# Patient Record
Sex: Female | Born: 1945 | Race: White | Hispanic: No | Marital: Married | Smoking: Current some day smoker
Health system: Southern US, Community
[De-identification: ages and names within clinical notes are randomized; demographics above are authoritative.]

## PROBLEM LIST (undated history)

## (undated) DIAGNOSIS — J189 Pneumonia, unspecified organism: Secondary | ICD-10-CM

## (undated) DIAGNOSIS — I499 Cardiac arrhythmia, unspecified: Secondary | ICD-10-CM

## (undated) DIAGNOSIS — I739 Peripheral vascular disease, unspecified: Secondary | ICD-10-CM

## (undated) DIAGNOSIS — I219 Acute myocardial infarction, unspecified: Secondary | ICD-10-CM

## (undated) DIAGNOSIS — Z720 Tobacco use: Secondary | ICD-10-CM

## (undated) DIAGNOSIS — M1990 Unspecified osteoarthritis, unspecified site: Secondary | ICD-10-CM

## (undated) DIAGNOSIS — E785 Hyperlipidemia, unspecified: Secondary | ICD-10-CM

## (undated) DIAGNOSIS — C801 Malignant (primary) neoplasm, unspecified: Secondary | ICD-10-CM

## (undated) HISTORY — PX: OTHER SURGICAL HISTORY: SHX169

---

## 2007-05-23 ENCOUNTER — Emergency Department (HOSPITAL_COMMUNITY): Admission: EM | Admit: 2007-05-23 | Discharge: 2007-05-23 | Payer: Self-pay | Admitting: Emergency Medicine

## 2007-05-29 ENCOUNTER — Encounter: Admission: RE | Admit: 2007-05-29 | Discharge: 2007-05-29 | Payer: Self-pay | Admitting: Family Medicine

## 2007-06-12 ENCOUNTER — Encounter: Admission: RE | Admit: 2007-06-12 | Discharge: 2007-06-12 | Payer: Self-pay | Admitting: Family Medicine

## 2007-06-20 ENCOUNTER — Encounter (INDEPENDENT_AMBULATORY_CARE_PROVIDER_SITE_OTHER): Payer: Self-pay | Admitting: Family Medicine

## 2007-06-20 ENCOUNTER — Ambulatory Visit (HOSPITAL_COMMUNITY): Admission: RE | Admit: 2007-06-20 | Discharge: 2007-06-20 | Payer: Self-pay | Admitting: Family Medicine

## 2007-06-22 ENCOUNTER — Ambulatory Visit: Payer: Self-pay | Admitting: Vascular Surgery

## 2007-06-28 ENCOUNTER — Ambulatory Visit: Payer: Self-pay | Admitting: Surgery

## 2007-06-28 ENCOUNTER — Ambulatory Visit (HOSPITAL_COMMUNITY): Admission: RE | Admit: 2007-06-28 | Discharge: 2007-06-28 | Payer: Self-pay | Admitting: Surgery

## 2007-07-13 ENCOUNTER — Ambulatory Visit: Payer: Self-pay | Admitting: Vascular Surgery

## 2007-07-23 ENCOUNTER — Observation Stay (HOSPITAL_COMMUNITY): Admission: RE | Admit: 2007-07-23 | Discharge: 2007-07-24 | Payer: Self-pay | Admitting: Vascular Surgery

## 2007-07-24 ENCOUNTER — Encounter: Payer: Self-pay | Admitting: Vascular Surgery

## 2007-07-24 ENCOUNTER — Ambulatory Visit: Payer: Self-pay | Admitting: Surgery

## 2007-08-17 ENCOUNTER — Ambulatory Visit: Payer: Self-pay | Admitting: Vascular Surgery

## 2007-10-19 ENCOUNTER — Ambulatory Visit: Payer: Self-pay | Admitting: Vascular Surgery

## 2008-01-08 ENCOUNTER — Ambulatory Visit: Payer: Self-pay | Admitting: Cardiology

## 2008-01-21 ENCOUNTER — Ambulatory Visit: Payer: Self-pay | Admitting: Cardiology

## 2008-02-06 ENCOUNTER — Ambulatory Visit: Payer: Self-pay | Admitting: Cardiology

## 2008-02-26 ENCOUNTER — Ambulatory Visit: Payer: Self-pay | Admitting: Vascular Surgery

## 2008-06-10 ENCOUNTER — Ambulatory Visit: Payer: Self-pay | Admitting: Vascular Surgery

## 2008-06-11 ENCOUNTER — Ambulatory Visit: Payer: Self-pay | Admitting: Vascular Surgery

## 2008-06-18 ENCOUNTER — Encounter: Admission: RE | Admit: 2008-06-18 | Discharge: 2008-06-18 | Payer: Self-pay | Admitting: Vascular Surgery

## 2008-06-20 ENCOUNTER — Encounter: Payer: Self-pay | Admitting: Cardiology

## 2008-06-20 ENCOUNTER — Ambulatory Visit: Payer: Self-pay

## 2008-06-24 ENCOUNTER — Telehealth: Payer: Self-pay | Admitting: Cardiology

## 2008-07-04 ENCOUNTER — Ambulatory Visit: Payer: Self-pay | Admitting: Vascular Surgery

## 2008-07-29 ENCOUNTER — Ambulatory Visit: Payer: Self-pay | Admitting: Vascular Surgery

## 2008-07-29 ENCOUNTER — Inpatient Hospital Stay (HOSPITAL_COMMUNITY): Admission: RE | Admit: 2008-07-29 | Discharge: 2008-08-02 | Payer: Self-pay | Admitting: Vascular Surgery

## 2008-07-29 ENCOUNTER — Encounter: Payer: Self-pay | Admitting: Vascular Surgery

## 2008-08-22 ENCOUNTER — Ambulatory Visit: Payer: Self-pay | Admitting: Vascular Surgery

## 2008-09-03 ENCOUNTER — Ambulatory Visit: Payer: Self-pay | Admitting: Vascular Surgery

## 2008-09-19 ENCOUNTER — Ambulatory Visit: Payer: Self-pay | Admitting: Vascular Surgery

## 2008-12-19 ENCOUNTER — Ambulatory Visit: Payer: Self-pay | Admitting: Vascular Surgery

## 2008-12-19 ENCOUNTER — Encounter: Payer: Self-pay | Admitting: Cardiology

## 2010-05-17 ENCOUNTER — Other Ambulatory Visit: Payer: Self-pay | Admitting: Family Medicine

## 2010-05-17 DIAGNOSIS — Z1231 Encounter for screening mammogram for malignant neoplasm of breast: Secondary | ICD-10-CM

## 2010-05-21 ENCOUNTER — Ambulatory Visit
Admission: RE | Admit: 2010-05-21 | Discharge: 2010-05-21 | Disposition: A | Discharge status: Home / Self Care | Payer: 59 | Source: Ambulatory Visit | Attending: Family Medicine | Admitting: Family Medicine

## 2010-05-21 DIAGNOSIS — Z1231 Encounter for screening mammogram for malignant neoplasm of breast: Secondary | ICD-10-CM

## 2010-05-24 ENCOUNTER — Other Ambulatory Visit (HOSPITAL_COMMUNITY)
Admission: RE | Admit: 2010-05-24 | Discharge: 2010-05-24 | Disposition: A | Discharge status: Home / Self Care | Payer: 59 | Source: Ambulatory Visit | Attending: Family Medicine | Admitting: Family Medicine

## 2010-05-24 ENCOUNTER — Other Ambulatory Visit: Payer: Self-pay | Admitting: Family Medicine

## 2010-05-24 DIAGNOSIS — R928 Other abnormal and inconclusive findings on diagnostic imaging of breast: Secondary | ICD-10-CM

## 2010-05-24 DIAGNOSIS — Z124 Encounter for screening for malignant neoplasm of cervix: Secondary | ICD-10-CM | POA: Insufficient documentation

## 2010-06-02 ENCOUNTER — Other Ambulatory Visit: Payer: 59

## 2010-06-15 LAB — URINALYSIS, ROUTINE W REFLEX MICROSCOPIC
Bilirubin Urine: NEGATIVE
Glucose, UA: NEGATIVE mg/dL
Hgb urine dipstick: NEGATIVE
Protein, ur: NEGATIVE mg/dL
Urobilinogen, UA: 0.2 mg/dL (ref 0.0–1.0)

## 2010-06-15 LAB — BLOOD GAS, ARTERIAL
Bicarbonate: 23 mEq/L (ref 20.0–24.0)
O2 Content: 8 L/min
Patient temperature: 98.6
Patient temperature: 98.6
TCO2: 24.1 mmol/L (ref 0–100)
TCO2: 24.6 mmol/L (ref 0–100)
pCO2 arterial: 35.4 mmHg (ref 35.0–45.0)
pCO2 arterial: 52.4 mmHg — ABNORMAL HIGH (ref 35.0–45.0)
pH, Arterial: 7.264 — ABNORMAL LOW (ref 7.350–7.400)
pH, Arterial: 7.429 — ABNORMAL HIGH (ref 7.350–7.400)

## 2010-06-15 LAB — BASIC METABOLIC PANEL
GFR calc Af Amer: >60 mL/min (ref >60)
GFR calc Af Amer: >60 mL/min (ref >60)
GFR calc non Af Amer: >60 mL/min (ref >60)
GFR calc non Af Amer: >60 mL/min (ref >60)
Glucose, Bld: 133 mg/dL — ABNORMAL HIGH (ref 70–99)
Glucose, Bld: 163 mg/dL — ABNORMAL HIGH (ref 70–99)
Potassium: 3.8 mEq/L (ref 3.5–5.1)
Potassium: 3.9 mEq/L (ref 3.5–5.1)
Sodium: 133 mEq/L — ABNORMAL LOW (ref 135–145)
Sodium: 136 mEq/L (ref 135–145)

## 2010-06-15 LAB — COMPREHENSIVE METABOLIC PANEL
ALT: 14 U/L (ref 0–35)
Alkaline Phosphatase: 87 U/L (ref 39–117)
BUN: 6 mg/dL (ref 6–23)
CO2: 22 mEq/L (ref 19–32)
CO2: 25 mEq/L (ref 19–32)
Calcium: 7 mg/dL — ABNORMAL LOW (ref 8.4–10.5)
Chloride: 108 mEq/L (ref 96–112)
GFR calc non Af Amer: >60 mL/min (ref >60)
Glucose, Bld: 113 mg/dL — ABNORMAL HIGH (ref 70–99)
Glucose, Bld: 134 mg/dL — ABNORMAL HIGH (ref 70–99)
Potassium: 4.1 mEq/L (ref 3.5–5.1)
Sodium: 137 mEq/L (ref 135–145)
Total Bilirubin: 0.3 mg/dL (ref 0.3–1.2)
Total Bilirubin: 0.7 mg/dL (ref 0.3–1.2)

## 2010-06-15 LAB — CBC
HCT: 29.5 % — ABNORMAL LOW (ref 36.0–46.0)
HCT: 31.4 % — ABNORMAL LOW (ref 36.0–46.0)
HCT: 41.8 % (ref 36.0–46.0)
Hemoglobin: 10 g/dL — ABNORMAL LOW (ref 12.0–15.0)
Hemoglobin: 10.2 g/dL — ABNORMAL LOW (ref 12.0–15.0)
Hemoglobin: 11.1 g/dL — ABNORMAL LOW (ref 12.0–15.0)
Hemoglobin: 14.3 g/dL (ref 12.0–15.0)
Platelets: 159 10*3/uL (ref 150–400)
RBC: 3.06 MIL/uL — ABNORMAL LOW (ref 3.87–5.11)
RBC: 3.32 MIL/uL — ABNORMAL LOW (ref 3.87–5.11)
RBC: 4.38 MIL/uL (ref 3.87–5.11)
RDW: 13.5 % (ref 11.5–15.5)
RDW: 13.6 % (ref 11.5–15.5)
WBC: 10.2 10*3/uL (ref 4.0–10.5)
WBC: 11.2 10*3/uL — ABNORMAL HIGH (ref 4.0–10.5)

## 2010-06-15 LAB — APTT: aPTT: 34 seconds (ref 24–37)

## 2010-06-15 LAB — PROTIME-INR
INR: 0.9 (ref 0.00–1.49)
INR: 1.2 (ref 0.00–1.49)
Prothrombin Time: 12.5 seconds (ref 11.6–15.2)

## 2010-06-15 LAB — MAGNESIUM: Magnesium: 1.5 mg/dL (ref 1.5–2.5)

## 2010-06-15 LAB — TYPE AND SCREEN: Antibody Screen: NEGATIVE

## 2010-07-20 NOTE — Op Note (Signed)
Alejandra Ford, Alejandra Ford                  ACCOUNT NO.:  [id number]   MEDICAL RECORD NO.:  [id number]          PATIENT TYPE:  AMB   LOCATION:  SDS                          FACILITY:  [facility]   PHYSICIAN:  Juleen China IV, MDDATE OF BIRTH:  [birth year]   DATE OF PROCEDURE:  06/28/2007  DATE OF DISCHARGE:  06/28/2007                               OPERATIVE REPORT   PREOPERATIVE DIAGNOSIS:  Right leg claudication.   POSTOPERATIVE DIAGNOSIS:  Right leg claudication.   PROCEDURE PERFORMED:  1. Ultrasound-guided access of left common femoral artery.  2. Abdominal aortogram.  3. Catheter into aorta x1.  4. Bilateral lower extremity runoff.   INDICATIONS:  This is a 65 year old female with right leg claudication.  Her AVS is in the 0.3 range, comes in for diagnostic study and possible  intervention.   Risks of this was discussed and informed consent was signed.   PROCEDURE:  The patient identified in the holding area and taken to room  seven.  She was placed supine on the table.  Bilateral groins were  prepped and draped in a standard sterile fashion.  Time-out was called.  Lidocaine 1% was used for local anesthesia.  Using ultrasound, left  common femoral artery was evaluated and found to be patent.  Using an 18-  gauge needle was accessed under ultrasound guidance.  0.35 wire was  advanced in a retrograde fashion into the abdominal aorta under  fluoroscopic visualization.  A 5-French sheath was placed over the wire  and Omni flush catheter was placed at level L1 and abdominal aortogram  was obtained.  Next, catheter was pulled down the aortic bifurcation.  Bilateral lower extremity runoff was obtained.   FINDINGS:  Aortogram:  The visualized portions of the suprarenal  abdominal aorta showed minimal disease.  There is single renal arteries  bilaterally, which were widely patent.  There is diffuse disease noted  throughout the infrarenal abdominal aorta; however, there was no lesions  greater than 50%.  The left common iliac artery is widely patent.  The  right common iliac artery is occluded.  It reconstitutes in the distal  external iliac artery on the right.   Left Lower Extremity:  The left external iliac artery is widely patent.  The left hypogastric artery is widely patent.  The left common femoral  artery is widely patent.  The left profunda femoral artery is widely  patent.  The left superficial femoral artery is widely patent.  The left  popliteal artery is widely patent.  There is a three-vessel runoff to  the left leg.   Right Lower Extremity:  The right common iliac artery is occluded.  There is reconstitution of the right external iliac artery; however,  there appears to be disease in the common femoral at the level of its  bifurcation.  The profunda femoral artery is patent.  The superficial  femoral artery is patent as is the popliteal artery.  Three-vessel  runoff was identified; however, visualization is difficult due to  proximal disease.   After the above images were obtained, decision made to stop  the  procedure as she is not a great candidate for percutaneous  revascularization.  Catheters and wires were removed.  The patient was  taken to holding area for sheath pull.   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  There appears to be disease  within the common femoral artery on the right, which would make  percutaneous revascularization not the optimal intervention at this  time.  The patient will discuss these findings and proceed with  intervention at a later date.           ______________________________  V. Charlena Cross, MD  Electronically Signed     VWB/MEDQ  D:  06/28/2007  T:  06/29/2007  Job:  [id number]

## 2010-07-20 NOTE — Procedures (Signed)
LOWER EXTREMITY ARTERIAL EVALUATION-SINGLE LEVEL   INDICATION:  Followup evaluation of bypass graft.  The patient denies  claudication and rest pain.   HISTORY:  Diabetes:  No.  Cardiac:  No.  Hypertension:  No.  Smoking:  Yes, 1/2 pack per day.  Previous Surgery:  Left to right fem-fem BPG on 07/23/2007 by Dr. Arbie Cookey.   RESTING SYSTOLIC PRESSURES: (ABI)                          RIGHT                LEFT  Brachial:               124                  120  Anterior tibial:        88 (0.71)            122  Posterior tibial:       86                   126 (>1.0)  Peroneal:  DOPPLER WAVEFORM ANALYSIS:  Anterior tibial:        Biphasic             Biphasic  Posterior tibial:       Biphasic             Triphasic  Peroneal:   PREVIOUS ABI'S:  Date:  07/24/2007  RIGHT:  0.25  LEFT:  0.66   IMPRESSION:  Significant increase in ABIs bilaterally since last taken  postop on 07/24/2007.   ___________________________________________  Larina Earthly, M.D.   PB/MEDQ  D:  08/17/2007  T:  08/17/2007  Job:  [id number]

## 2010-07-20 NOTE — Assessment & Plan Note (Signed)
[name]                            CARDIOLOGY OFFICE NOTE   Alejandra Ford, Alejandra Ford                         MRN:          [id number]  DATE:01/08/2008                            DOB:          [birth year]    REFERRING PHYSICIAN:  Levert Feinstein, MD   PRIMARY CARE PHYSICIAN:  Duncan Dull, MD   REASON FOR PRESENTATION:  Evaluate the patient with palpitations,  dyspnea, and peripheral vascular disease.   HISTORY OF PRESENT ILLNESS:  The patient is a pleasant 65 year old white  female without prior cardiac history.  Starting in March, she has had  episodes of palpitations and dizziness.  This seems to happen typically  when she is driving and typically in the morning.  In March, she  presented to the emergency room, was thought to have possibly vertigo.  However, since that time, she has had extensive neurologic and vascular  evaluation.  This included an MRI of her brain and spine.  She has had  an EEG.  She has seen Dr. Arbie Cookey who is manager for Peripheral Vascular  Disease and she reports having had carotid Dopplers.  She actually is  now status post fem-fem bypass.  She has seen Dr. Kevan Ny, was treated  with Paxil thinking there was perhaps a component of anxiety.  The  patient's last full-blown episode was really in July.  However, she has  not been driving very much since then.  Her husband is always in the car  with her and he is almost always driving.   She describes episodes where she will be driving.  She will feel very  sleepy.  Her heart will start beating fast and pounding.  Her head  starts pounding.  She gets short of breath.  She will be then pulled  over.  She has never had any syncope.  She says she might get a little  dizzy on other occasions such as shopping, but has not had severe  episodes like this.  It passes with time.  She is not taking anything to  try to prevent these other than the Paxil.  She also does notice with  activity she  has been getting more short of breath.  This has been  happening over the last several weeks to months.  She is getting short  of breath, walking up a flight of stairs.  Her husband has noticed this.  She has to stop what she is doing.  She does not describe chest  pressure, neck or arm discomfort.  She does not have the palpitations as  necessarily with this.  She does not have any resting symptoms and  denies any PND or orthopnea.  She has not had any cardiac evaluation.   PAST MEDICAL HISTORY:  Depression/anxiety, hyperlipidemia, peripheral  vascular disease.   PAST SURGICAL HISTORY:  Left knee surgery for torn meniscus, fem-fem  bypass of the right with persistently low ABIs followed by Dr. Arbie Cookey.   ALLERGIES:  Seasonal.   MEDICATIONS:  1. Paxil 12.5 mg daily.  2. Lipitor 20 mg daily.  3. Aspirin  81 mg daily.  4. Multivitamin.  5. Glucosamine/chondroitin.   SOCIAL HISTORY:  She is an Production designer, theatre/television/film.  She is married.  She has  three children.  She has been smoking for 37 years and is down to half  pack a day.   FAMILY HISTORY:  Noncontributory for early coronary artery disease.  Her  father died at age 72 of congestive heart failure.  She does have  apparently a family history of joint problems, though not vascular  disease.   REVIEW OF SYSTEMS:  As stated in the HPI, positive occasional headaches,  partial dentures, joint pains.  Negative for all other systems.   PHYSICAL EXAMINATION:  GENERAL:  The patient is pleasant and in no  distress.  VITAL SIGNS:  Blood pressure 110/78, heart rate 78 and regular, weight  140 pounds.  HEENT:  Eyes unremarkable.  Pupils equal, round, and reactive to light.  Fundi not visualized.  Oral mucosa unremarkable.  NECK:  No jugular venous distention at 45 degrees.  Carotid upstroke  brisk and symmetrical.  No bruits, no thyromegaly.  LYMPHATICS:  No cervical, axillary, or inguinal adenopathy.  LUNGS:  Clear to auscultation bilaterally.   BACK:  No costovertebral angle tenderness.  CHEST:  Unremarkable.  HEART:  PMI not displaced or sustained.  S1 and S2 within normal limits.  No S3, no S4.  No clicks, no rubs, no murmurs.  ABDOMEN:  Flat, positive  bowel sounds normal in frequency and pitch.  No bruits, no rebound, no  guarding.  No midline pulsatile mass.  No hepatomegaly, no splenomegaly.  SKIN:  No rashes, no nodules.  EXTREMITIES:  2+ upper pulses, 2+ femorals, absent popliteals  bilaterally, absent dorsalis pedis and posterior tibialis bilaterally.  No edema, no cyanosis, no clubbing.  NEUROLOGICAL:  Oriented to person, place, and time.  Cranial nerves II  through XII grossly intact.  Motor grossly intact.   EKG, sinus rhythm, rate 78, axis within normal limits, intervals within  normal limits, no acute ST-T wave changes.   ASSESSMENT AND PLAN:  1. Dizziness/palpitations.  These episodes are on and that they happen      typically in the morning when she is driving.  This may well      represent panic.  I cannot exclude a dysrhythmia.  I am going to      have her wear a 24-hour Holter monitor on the outside chance that      she will have an symptomatic dysrhythmia or actually have one of      her spells.  She has already had labs and I will try to check and      make sure these have included magnesium, potassium, and TSH.  If      this is non-revealing, then I do not suspect further cardiac workup      would be helpful unless she has more frequent severe episodes.  At      that point, I probably would apply an external loop.  2. Dyspnea.  This is concerning to me the patient with vascular      disease.  It maybe primary lung related, to her smoking.  However,      it could be an anginal equivalent.  I am going to perform a plain      old exercise treadmill (POET).  She thinks she could walk this.      This would be reasonable test given the moderately low pretest  probability of obstructive coronary disease.   3. Tobacco.  We discussed the need to stop smoking for greater than 3      minutes.  She will consider her options.  4. Peripheral vascular disease.  Again she needs aggressive risk      reduction.  This we followed by her primary doctor and Dr. Arbie Cookey.  5. Dyslipidemia.  She recently had her statin dose doubled.  I will      defer to Dr. Kevan Ny.  I would suggest an LDL less than 70 and HDL      greater than 50 as aggressive goal in a patient with ongoing      tobacco abuse.  6. Followup.  I will see her at the time of her plain old exercise      treadmill.     Rollene Rotunda, MD, [facility]  Electronically Signed    JH/MedQ  DD: 01/08/2008  DT: 01/09/2008  Job #: [id number]   cc:   Duncan Dull, M.D.  Levert Feinstein, MD

## 2010-07-20 NOTE — Discharge Summary (Signed)
[name], [name] NO.:  [id number]   MEDICAL RECORD NO.:  [id number]          PATIENT TYPE:  INP   LOCATION:  2025                         FACILITY:  [facility]   PHYSICIAN:  Larina Earthly, M.D.    DATE OF BIRTH:  Nov 06, 1945   DATE OF ADMISSION:  07/29/2008  DATE OF DISCHARGE:  08/02/2008                               DISCHARGE SUMMARY   ADMISSION DIAGNOSIS:  Bilateral lower extremity atheroemboli.   FINAL DISCHARGE DIAGNOSES:  1. 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  2. History of right to left femoral femoral bypass grafting with 8-mm      Hemashield graft on Jul 23, 2007.  3. History of tobacco abuse.  4. History of depression and anxiety.  5. Dyslipidemia.  6. Left knee surgery for torn meniscus.   PROCEDURES:  On Jul 29, 2008, aorto-bi-common iliac artery bypass using  12 x 7 mm Hemashield Dacron graft by Dr. Tawanna Cooler Early.   BRIEF HISTORY:  [name] is a 65 year old Caucasian female who had  prior history of right common iliac artery occlusion with left to right  femoral femoral bypass grafting in 2009.  She presented to the [facility]  office with obvious atheroemboli in both feet, left more so than on the  right.  Her old arteriogram showed some moderate irregularity in her  aorta.  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  There was normal caliber of her left common and external  iliac arteries with no evidence of thrombus.  On the right,  reconstitution of her distal common iliac artery with patent external  and internal iliac on the right.  Dr. Arbie Cookey recommended aortofemoral  versus aortoiliac bypass grafting.   HOSPITAL COURSE:  [name] was electively admitted to [facility] on Jul 29, 2008.  She underwent the previously mentioned  procedure.  Postoperatively, she was taken to  the surgical intensive  care unit but thought appropriate to transfer to telemetry unit in 2000  on postoperative day #1.  Nasogastric tube was discontinued prior to  transfer.  On postoperative day #2, she was started on clear liquids and  on postoperative day #3, diet was advanced to regular food.  On  postoperative day #4, she reported she was tolerating regular food.  She  had no nausea or vomiting.  She was yet to have bowel movement but did  ultimately have small results following the Dulcolax suppository.  She  was positive for flatus.  Telemetry, she initially had some mild sinus  tachycardia postoperatively, but heart rate was in the 90s at discharge.  Blood pressure was stable 130/79.  She had intermittent low-grade fevers  but none over 100.  Pulmonary toilet was encouraged, oxygen saturations  were 92% or greater on room air.  Her lung sounds were clear.  Abdominal  exam showed a soft with minimal distention with bowel sounds present.  Her incision was clean, dry,  and intact.  Sutures were used to close her  abdomen.  On the extremities, she had palpable pedal pulses.  She had a  stable eschar on her left second toe and still some residual evidence of  atheroemboli to her toes.  Since, she was tolerating food, pain was  controlled and hemodynamically she remained stable.  She was thought  appropriate for discharge home on postoperative day #4 on Aug 02, 2008,  in stable and improving condition.   DISCHARGE MEDICATIONS:  1. Lipitor 40 mg daily.  2. Oxycodone/APAP 5/500 mg 1 tablet q.4 h. p.r.n. pain.  3. Paroxetine 12.5 mg daily.  4. Tramadol 50 mg p.o. b.i.d.  5. Betamethasone 0.05% p.r.n. to affected areas per her home regimen.  6. Aspirin 81 mg daily   DISCHARGE INSTRUCTIONS:  She is to continue a heart-healthy diet.  Increase activity slowly.  Avoid driving for the next 2 weeks.  No heavy  lifting for the next 6 weeks.  She may shower and clean her incisions  gently  with soap and water.  She should call if she has drainage from  her incision sites, increased pain, fevers greater than 101 or  persistent nausea, vomiting, or abdominal pain.  She otherwise should  see Dr. Arbie Cookey in 2-3 weeks.  Her office will contact her regarding  specific appointment date and time.      Jerold Coombe, P.A.      Larina Earthly, M.D.  Electronically Signed    AWZ/MEDQ  D:  08/02/2008  T:  08/03/2008  Job:  [id number]   cc:   Duncan Dull, M.D.  Rollene Rotunda, MD, [facility]

## 2010-07-20 NOTE — Procedures (Signed)
CAROTID DUPLEX EXAM   INDICATION:  Multiple episodes of vertigo.   HISTORY:  Diabetes:  No.  Cardiac:  No.  Hypertension:  No.  Smoking:  Half pack per day.  Previous Surgery:  Recent lower extremity arterial angiogram.  CV History:  The patient had a presyncopal episode 1 month ago and an  episode of right leg numbness which lasted 3-4 minutes.  The patient has  had several episodes of vertigo since.  Amaurosis Fugax No, Paresthesias Yes, Hemiparesis No                                       RIGHT             LEFT  Brachial systolic pressure:         140               146  Brachial Doppler waveforms:         Triphasic         Triphasic  Vertebral direction of flow:        Antegrade         Antegrade  DUPLEX VELOCITIES (cm/sec)  CCA peak systolic                   51                59  ECA peak systolic                   86                91  ICA peak systolic                   69                58  ICA end diastolic                   24                25  PLAQUE MORPHOLOGY:                  Soft              None  PLAQUE AMOUNT:                      Minimal           None  PLAQUE LOCATION:                    Proximal ICA      None   IMPRESSION:  1. 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  2. No left ICA stenosis.   ___________________________________________  Larina Earthly, M.D.   MC/MEDQ  D:  07/13/2007  T:  07/13/2007  Job:  [id number]

## 2010-07-20 NOTE — Procedures (Signed)
BYPASS GRAFT EVALUATION   INDICATION:  Follow-up evaluation of bypass graft.   HISTORY:  Diabetes:  No.  Cardiac:  No.  Hypertension:  No.  Smoking:  Yes.  Previous Surgery:  Left-to-right femoral-to-femoral bypass graft on  07/23/2007 by Dr. Arbie Cookey.   SINGLE LEVEL ARTERIAL EXAM                               RIGHT              LEFT  Brachial:                    129                131  Anterior tibial:             69                 112  Posterior tibial:            91                 136  Peroneal:  Ankle/brachial index:        0.69               1.04   PREVIOUS ABI:  Date: 10/19/2007  RIGHT:  0.60  LEFT:  0.98   LOWER EXTREMITY BYPASS GRAFT DUPLEX EXAM:   DUPLEX:  1. Increased velocity of 254 cm/s from inflow artery and 371 cm/s at      the proximal left-to-right femoral-to-femoral artery bypass.  2. The remaining velocities are within normal limits.   IMPRESSION:  1. The right ABI suggests moderate disease with biphasic Doppler      waveform.  2. Normal ABI of the left lower extremity with biphasic Doppler      waveform.   ___________________________________________  Larina Earthly, M.D.   AC/MEDQ  D:  02/26/2008  T:  02/26/2008  Job:  [id number]

## 2010-07-20 NOTE — Assessment & Plan Note (Signed)
OFFICE VISIT   Alejandra Ford, Alejandra Ford  DOB:  1946-01-16                                       07/04/2008  NFAOZ#:[id number]   The patient presents today for continued follow-up of emboli to both  feet, left greater than right.  She continues to have severe pain.  She  has not been taking a great deal of pain medicine, mostly treating with  nonsteroidal anti-inflammatories.   She on physical exam continues to have a 2+ right dorsalis pedis pulse  and a 2+ left posterior tibial pulse.  She does not have any specific  tissue loss on her right foot and does have some burning sensation in  her right foot but no severe pain.  On her left foot she does have  severe pain and cyanotic toes 1 through 5.  She does have an ulceration  on the second toe.   We are here for discussion of her CT scan.  This shows marked  irregularity in mural thrombus with cul-de-sacs and septa throughout  with a very irregular lumen.  I discussed this at length with the  patient and her husband present.  I explained that it is impossible to  prove that this was the source of emboli but this is a very high  likelihood due to the extreme amount of irregularity in her infrarenal  aorta.  She does have a known right common iliac artery occlusion with a  patent left to right fem-fem bypass.  I discussed options with the  patient.  I have recommended that she undergo aortobifemoral bypass to  reduce her risk for recurrent embolization.  I did explain that with  normal pulses to her feet that this operation would not hasten the  resolution of her embolic event.  I explained that the indication for  surgery was to reduce her risk for recurrent embolus.  I discussed the  procedure to include 1-2 days of intensive care hospitalization with a 5-  7 day total hospitalization and a 4-6 week recovery following discharge  from the hospital.  She had undergone a recent echocardiogram with no  evidence of  embolic source and normal pump.   The patient and her husband are planning a cruise in 3 weeks with other  family members and are trying to decide whether to defer the surgery  until after the cruise or to cancel the cruise and proceed with surgery.  I explained that the only risk in waiting would be the potential for  recurrent embolus.  By history, she has only had 1 episode that occurred  approximately 6 weeks ago and, therefore, I suggested that my best guess  would be that her risk would be approximately 10% over the short period  of time for re-embolization.  They understand.  They will notify us for  timing regarding her aortofemoral bypass.   Larina Earthly, M.D.  Electronically Signed   TFE/MEDQ  D:  07/04/2008  T:  07/07/2008  Job:  [id number]   cc:   Duncan Dull, M.D.  Rollene Rotunda, MD, [facility]

## 2010-07-20 NOTE — Assessment & Plan Note (Signed)
OFFICE VISIT   FAIRY, ASHLOCK  DOB:  19-Jul-1945                                       12/19/2008  EAVWU#:[id number]   Patient presents today for continued evaluation of her aortobi-iliac  bypass for lower extremity arterial insufficiency and emboli to her  lower extremities.  She is doing quite well today.  She reports she is  walking with no claudication symptoms.  Her toe ulceration has  completely healed.  She did lose a nail bed, and this is healing as  well.  She has no other new cardiac difficulties.   Her femoral and posterior tibial pulses are 2+ bilaterally.  Abdominal  exam reveals no masses, no tenderness, and a well-healed midline  incision.   I again discussed the potential for graft occlusion over time with Ms.  Shawnie Pons.  I explained this is extremely low risk.  She will continue to  follow up with [place] should she develop any new claudication symptoms or  tissue loss.  She is quite pleased with her result, as am I.   Larina Earthly, M.D.  Electronically Signed   TFE/MEDQ  D:  12/19/2008  T:  12/19/2008  Job:  [id number]   cc:   Duncan Dull, M.D.  Rollene Rotunda, MD, [facility]

## 2010-07-20 NOTE — Assessment & Plan Note (Signed)
OFFICE VISIT   Alejandra Ford, Alejandra Ford  DOB:  1946/03/01                                       06/11/2008  ZOXWR#:[id number]   The patient returns today for evaluation of ischemic toes on both feet.  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  Over the past month she has noted cyanosis and  pain in her toes more so on the left foot than on her right foot.  She  reports this can cause pain at night.  She does not have any classic  claudication symptoms.  She does have a history of elevated cholesterol.  No history of prior atheroemboli to her knowledge.  She has seen Dr.  Antoine Poche for cardiac evaluation and was told she had a mildly irregular  heart rate.  She has not been on Coumadin.   CURRENT MEDICATIONS:  Lipitor and Paxil.   She does unfortunately continue to smoke a pack of cigarettes per day.   PHYSICAL EXAM:  A well-developed, well-nourished white female appearing  her stated age of 47.  She has 2+ radial pulses.  She has 2+ femoral  pulses and an easily palpable fem-fem graft pulse.  She has a 1+  popliteal pulse and 1-2+ dorsalis pedis pulses bilaterally.  Her right  great toe shows cyanosis over the tip.  Her left foot reveals cyanosis  over all five toes most pronounced in the first, second and fifth toe.   She underwent noninvasive vascular laboratory studies in our office  yesterday on 06/10/2008.  These reveal no change in her study from 3  months ago.  Her ankle arm index on the right is stable at 0.74.  on the  left her ankle arm index is normal at 1.0.  She does have moderately  decreased bilateral toe indices bilaterally and a severely dampened PPG  waveform on the second toe on the left foot.  I discussed this at great  length with the patient and her husband present.  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  I explained this could be either primary  occlusive disease in the vessels themselves or atheroemboli.  I will  discuss this with Dr. Antoine Poche to determine if further cardiac  evaluation is warranted although she does recall having an  echocardiogram which she felt was reportedly normal.  I have scheduled  her for a CT angiogram of her chest, abdomen, pelvis and runoff to rule  out any embolic source in her arterial tree.  I explained to her the  critical importance of absolute smoking cessation.  I explained that  with small vessel disease it would make a very large impact regarding  spasm to the small vessels.  We will contact her following the CT scan  for further recommendation.   Larina Earthly, M.D.  Electronically Signed   TFE/MEDQ  D:  06/11/2008  T:  06/12/2008  Job:  [id number]   cc:   Duncan Dull, M.D.  Rollene Rotunda, MD, [facility]

## 2010-07-20 NOTE — Consult Note (Signed)
NEW PATIENT CONSULTATION   Alejandra Ford, Alejandra Ford  DOB:  05/02/1945                                       06/22/2007  QIONG#:[id number]   The patient presents today for evaluation of lower extremity arterial  insufficiency.  It is a somewhat usual story in that she had marked  progression of lower extremity pain bilaterally, much more so on the  right than on the left.  She reports an episode of dizziness, right leg  numbness in March.  She underwent evaluation to include MRI which was  negative.  It was felt that this may be related to otitis.  She  continues to have difficulty.  She reports that she is having difficulty  with driving and having diffuse numbness mostly in her right leg but  also occasionally in her left leg.  She is to have an appointment with a  neurologist in several months.   PAST HISTORY:  Negative for any major medical difficulties.  She does  have a history of smoking one-half pack cigarettes per day.  Does not  drink alcohol.  She is married and works for the [employer].  She has 3 children.   REVIEW OF SYSTEMS:  She has reported weight of 134 pounds, 5 feet 4  inches tall.  She does report palpitations, shortness of breath with exertion.  She does report arthritic joint and muscle pain.   She has no known drug allergies.  Her only medications is p.r.n. Aleve.   PHYSICAL EXAM:  Blood pressure 134/70, heart rate is 84, respirations  16.  Her radial pulses are 2+ bilaterally.  She has palpable left  femoral and palpable left popliteal pulse.  She has absent right femoral  and absent right popliteal and distal pulses.  I do not palpate pedal  pulses on the left.  She does not have any tissue loss.   I have her noninvasive vascular laboratory studies from [facility] for review.  This reveals an ankle arm index on the right of  0.28, on the left of 0.89.  I discussed this at length with the patient  and her husband present.  I  explained that this would not explain the  episodes of dizziness and probably not the episodes of numbness,  although she does have a severe ischemia.  Reportedly, the claudication-  type symptoms that she clearly has came on relatively suddenly over the  course of the last several months.  I have recommended we proceed with  arteriography for further evaluation determine the appropriate  treatment.  I explained there is chance this could be treated with an  endovascular treatment with angioplasty, stenting or may require  surgical correction.  She is scheduled for outpatient arteriography next  week at [facility] with Dr. Durene Cal.  We will discuss her  results and make further planning following this.   Larina Earthly, M.D.  Electronically Signed   TFE/MEDQ  D:  06/22/2007  T:  06/25/2007  Job:  [id number]   cc:   Duncan Dull, M.D.

## 2010-07-20 NOTE — Assessment & Plan Note (Signed)
OFFICE VISIT   Alejandra Ford, Alejandra Ford  DOB:  Mar 28, 1945                                       07/13/2007  ZOXWR#:[id number]   The patient presents today for review and discussion of her recent lower  extremity arteriogram on 06/28/2007 and also for discussion of today's  carotid duplex.  She continues to have severe limiting claudication in  her right leg.  She reports that she is unable to do her job function  and also to do her activities of daily living due to severe total leg  right leg claudication.  She continues to have some dizziness that was  felt to possibly be related to inner ear which is getting some better.  She does still report some positional dizziness.   PHYSICAL EXAMINATION:  Physical exam is unchanged.  She does not have a  palpable right femoral pulse and does have a 2+ left femoral pulse and  2+ left dorsalis pedis pulse.   She underwent a carotid duplex today and this reveals no evidence of  carotid stenosis.  She has antegrade vertebral flow bilaterally.  I did  review her outpatient arteriogram with her from 06/28/2007.  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  She had  reconstitution of her right external iliac artery and has some disease  in the common femoral artery to the level of the bifurcation.   Due to her severe limitation I have recommended that she undergo  correction of this.  Her left iliac system is with minimal disease.  I  have recommended a left to right fem-fem bypass.  I discussed the  procedure to her including potential graft occlusion.  I explained that  she should in all likelihood have a 1-2 day hospitalization and should  have full recovery in several weeks.  She understands and wishes to  proceed.  We will schedule this at her convenience on 07/23/2007.  Her  full consultation, history and physical was performed on 06/22/2007 at  our office.   Larina Earthly, M.D.  Electronically  Signed   TFE/MEDQ  D:  07/13/2007  T:  07/14/2007  Job:  [id number]   cc:   Duncan Dull, M.D.

## 2010-07-20 NOTE — Discharge Summary (Signed)
Alejandra Ford, Alejandra Ford                  ACCOUNT NO.:  [id number]   MEDICAL RECORD NO.:  [id number]          PATIENT TYPE:  INP   LOCATION:  3316                         FACILITY:  [facility]   PHYSICIAN:  Larina Earthly, M.D.    DATE OF BIRTH:  March 20, 1945   DATE OF ADMISSION:  07/23/2007  DATE OF DISCHARGE:  07/24/2007                               DISCHARGE SUMMARY   FINAL DIAGNOSIS:  Right leg ischemia with claudication.   HISTORY OF PRESENT ILLNESS:  The patient is a 65 year old white female  with progressively severe right leg claudication.  Preoperative  arteriogram revealed occlusion of her right common iliac arteries.  It  was recommended that she undergo left to right fem-fem bypass for relief  of her symptoms.   PAST HISTORY:  Negative for major medical difficulties.  Specifically,  no cardiac difficulties.   CURRENT MEDICATIONS:  Her only current medications are Aleve p.r.n.   HOSPITAL COURSE:  The patient was admitted on Jul 23, 2007, where she  underwent a left to right fem-fem bypass without complications.  On  postoperative day #1, she was walking without difficulty.  Her groin  wounds were healing nicely with subcuticular sutures.  She had a  palpable femoral-femoral bypass pulse and a well-perfused feet  bilaterally.  She was discharged to home for followup in 2-3 weeks.      Larina Earthly, M.D.  Electronically Signed     TFE/MEDQ  D:  07/24/2007  T:  07/25/2007  Job:  [id number]

## 2010-07-20 NOTE — Assessment & Plan Note (Signed)
[facility]                            CARDIOLOGY OFFICE NOTE   Alejandra Ford, Alejandra Ford                         MRN:          [id number]  DATE:02/06/2008                            DOB:          03-25-1945    PRIMARY CARE PHYSICIAN:  Duncan Dull, MD.   REASON FOR PRESENTATION:  Evaluate the patient with palpitations.   HISTORY OF PRESENT ILLNESS:  The patient returns for followup of the  above.  I saw her last when I did an exercise treadmill.  She had an  adequate study.  She had no ischemic ST-T wave changes.  However, she  did have a slightly accelerated blood pressure response.  To further  evaluate her complaint of palpitations, I did have her wear a Holter.  She had a few premature ventricular contractions.  However, these were  rare.  During this time, she actually drove a car which typically brings  on her symptoms.  She did not have any tachy palpitations or dizziness  with this.  She said she has been doing much better driving a car.  She  has had no presyncope or syncope.  She has had no chest discomfort, neck  or arm discomfort.  She has had no new shortness of breath.  She  actually wants to start exercising.  She is still smoking 3 cigarettes a  day.   PAST MEDICAL HISTORY:  1. Peripheral vascular disease (status post fem-fem bypass).  2. Depression/anxiety.  3. Dyslipidemia.  4. Left knee surgery for torn meniscus.   ALLERGIES:  Seasonal.   MEDICATIONS:  1. Paxil 12.5 mg daily.  2. Aspirin 81 mg daily.  3. Multivitamin.  4. Glucosamine and chondroitin.  5. Lipitor 40 mg daily.   REVIEW OF SYSTEMS:  As stated in the HPI and otherwise negative for  other systems.   PHYSICAL EXAMINATION:  GENERAL:  The patient is pleasant and in no  distress.  VITAL SIGNS:  Blood pressure 113/65, heart rate 81 and regular, weight  142 pounds, and body mass index 24.  HEENT:  Eyes unremarkable.  Pupils equal, round, and reactive to light.  Fundi not visualized.  NECK:  No jugular venous distention at 45 degrees.  Carotid upstroke  brisk and symmetrical.  No bruits.  No thyromegaly.  LYMPHATICS:  No adenopathy.  LUNGS:  Clear to auscultation bilaterally.  BACK:  No costovertebral angle tenderness.  CHEST:  Unremarkable.  HEART:  PMI not displaced or sustained.  S1 and S2 within normal limits.  No S3, no S4.  No clicks, no rubs, no murmurs.  ABDOMEN:  Flat, positive  bowel sounds.  Normal in frequency and pitch.  No bruits, no rebound, no  guarding.  No midline pulsatile mass.  No hepatomegaly.  SKIN:  No rashes.  No nodules.  EXTREMITIES:  Upper pulses 2+, 2+ femorals, absent popliteals  bilaterally, absent dorsalis pedis, and posterior tibialis bilaterally.  No edema, no cyanosis, no clubbing.  NEUROLOGICAL:  Grossly intact.   ASSESSMENT AND PLAN:  1. Palpitations:  She had a few premature ventricular contractions,  but no severe symptomatic palpitations.  At this point, no further      cardiovascular testing is suggested.  We discussed the possibility      of using a beta-blocker if she has any recurrence of this.  She      will let me know if she has any recurrent tachy palpitations.  2. Dyspnea:  I think she can start an exercise regimen.  We should      keep an eye on her blood pressure as she may need treatment for      this in the future.  It should be a low-to-moderate level of      exercise, and I do not think she should have any issues with this.  3. Tobacco:  We talked about the need to stop smoking (greater than 3      minutes).  I think she could tolerate Chantix, so she has a history      of oppression; however, she is only now smoking 3 cigarettes.  If      she tries stopping completely and it does not work, then she can      talk to Dr. Kevan Ny about Chantix.  4. Peripheral vascular disease per Dr. Colin Benton.  5. Dyslipidemia:  I would suggest a goal LDL less than 100 and HDL      greater than 40.  6.  Followup:  I will see her back as needed.     Rollene Rotunda, MD, [facility]  Electronically Signed    JH/MedQ  DD: 02/06/2008  DT: 02/07/2008  Job #: [id number]   cc:   Duncan Dull, M.D.

## 2010-07-20 NOTE — Op Note (Signed)
NAMELAKEESHA, FONTANILLA NO.:  [id number]   MEDICAL RECORD NO.:  [id number]          PATIENT TYPE:  INP   LOCATION:  3316                         FACILITY:  [facility]   PHYSICIAN:  Larina Earthly, M.D.    DATE OF BIRTH:  June 20, 1945   DATE OF PROCEDURE:  07/23/2007  DATE OF DISCHARGE:                               OPERATIVE REPORT   PREOPERATIVE DIAGNOSIS:  Right leg claudication.   POSTOPERATIVE DIAGNOSIS:  Right leg claudication.   PROCEDURE:  Left-to-right fem-fem bypass with a 8-mm Hemashield graft.   SURGEON:  Larina Earthly, MD   ASSISTANT:  Nurse.   ANESTHESIA:  General endotracheal.   COMPLICATIONS:  None.   DISPOSITION:  Recovery room, stable.   PROCEDURE IN DETAIL:  The patient was taken to the operating room and  placed in a supine position, where the area of both groins were prepped  and draped in the usual sterile fashion.  Bilateral groin incisions were  made obliquely just above the inguinal crease.  These were carried down  nicely at the common, superficial femoral, and profunda femoris arteries  bilaterally.  The patient had an occluded common iliac artery on the  right.  A tunnel was created above the level of the suprapubic area in a  somewhat of a C configuration.  An 8-mm Hemashield graft was brought to  the tunnel.  The patient was given a 7000 units of  intravenous heparin.  After adequate circulation time, the left common femoral artery was  occluded proximally and distally.  An arteriotomy was made anteriorly on  the common femoral artery on the left and was extended longitudinally  with Potts scissors.  The grafts were cut at the appropriate dimension  and was sewn end-to site to the artery with a running 6-0 Prolene  suture.  The anastomosis was tested and found to be adequate.  The graft  was flushed with heparinized saline and reoccluded.  Next, the right  common, superficial femoral and profunda femoris arteries were occluded.  The  common femoral artery was opened with 11 blade and extended with  Potts scissors.  This was extended down on to the proximal portion of  the superficial femoral artery.  The superficial femoral artery was  patent and a 3 dilator passed without resistance.  There was stenosis in  the profunda femoris artery and there was an endarterectomy at the  origin of the profunda femoris artery allowing better flow into the  profunda.  The graft was cut to appropriate length, it was spatulated  and sewn end-to-side to the junction of the common and superficial  femoral arteries.  This was with the running 6-0 Prolene suture.  After  the usual flush maneuvers, the anastomosis was completed.  After  anastomosis was completed, flow was restored to the right leg.  Excellent Doppler signal was noted in the superficial femoral and  profunda femoris arteries.  The patient was given 50 mg of Protamine to  reverse heparin.  The wounds were irrigated  with saline.  Hemostasis with electrocautery.  Wounds were closed  with 2-  0 Vicryl in the subcutaneous tissue in several layers.  The skin was  closed with 3-0 subcuticular Vicryl stitch.  Benzoin and Steri-Strips  were applied and the patient was taken to the recovery room in stable  condition.      Larina Earthly, M.D.  Electronically Signed     TFE/MEDQ  D:  07/23/2007  T:  07/24/2007  Job:  [id number]

## 2010-07-20 NOTE — Assessment & Plan Note (Signed)
OFFICE VISIT   Alejandra Ford, Alejandra Ford  DOB:  06/12/1945                                       08/17/2007  ZOXWR#:[id number]   The patient presents today for followup of her left to right fem-fem  bypass on 07/23/2007 at [facility].  She did well in the  hospital and was discharged to home with no complications.  She has  continued to increase her activity as the soreness from her groin  incisions has resolved.  She has had no claudication symptoms.   On physical examination her groin incisions are completely healed.  She  does have an easily palpable fem-fem bypass graft pulse in her  suprapubic area.  Her ankle arm index has improved to 0.71 on the right  and is normal at 1.0 on the left.  Her preoperative study revealed a  markedly diminished flow in her right leg with an ankle arm index of  0.28.  I am quite pleased with her initial result as is the patient.  We  will see her again in August in our vascular lab to continue to follow  her progress as she continues to increase her activity level.  She is  released to return to work on 06/15 with no restrictions.   Larina Earthly, M.D.  Electronically Signed   TFE/MEDQ  D:  08/17/2007  T:  08/17/2007  Job:  [id number]   cc:   Duncan Dull, M.D.

## 2010-07-20 NOTE — Assessment & Plan Note (Signed)
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  She did well and was  discharged to home following this.  She had an old left to right fem-fem  bypass for iliac occlusion.  She continues to do well.  She had the  usual amount of diminished stamina and has lost approximately 10 pounds.  Her feet/foot pain is markedly better than it had been.  She does have  eschar over the tip of her left second toe which is beginning to  separate.  She also has an eschar at the level of the umbilicus for  abdominal wound.  This was closed subcutaneous with continuous Vicryl  suture.  I am pleased with her followup.  She does have erythema around  this and therefore was given a prescription for Keflex 500 mg t.i.d. for  10 days.  I will plan to see her again in 1 month for continued  followup.   Larina Earthly, M.D.  Electronically Signed   TFE/MEDQ  D:  08/22/2008  T:  08/22/2008  Job:  [id number]   cc:   Duncan Dull, M.D.  Rollene Rotunda, MD, [facility]

## 2010-07-20 NOTE — Procedures (Signed)
[facility]                              EXERCISE TREADMILL   CHELCEA, ZAHN                         MRN:          [id number]  DATE:01/21/2008                            DOB:          [birth year]    PRIMARY CARE PHYSICIAN:  Levert Feinstein, MD   REASON FOR PRESENTATION:  Evaluate the patient with dyspnea on exertion,  palpitations, and chest discomfort.   PROCEDURE NOTE:  The patient exercised using standard Bruce protocol.  She was only able to exercise for 6 minutes.  This completed stage II.  She did achieve a target heart rate with a maximum of 166 which was 104%  predicted.  She achieved 7.0 METS.  She had an accelerated blood  pressure response with the maximum 206/88.  The test was terminated  because of this, because she achieved a target heart rate and because of  dyspnea.  She did not have chest discomfort.  However, she had some leg  discomfort and dyspnea which was somewhat out of proportion to her level  of exertion.  There were no ischemic ST-T wave changes.  There were no  arrhythmias noted.  She had an abnormal heart rate recovery.   CONCLUSION:  Negative adequate exercise treadmill test.  There were no  high-risk features suggestive of obstructive coronary artery disease.  However, she is at least at moderate risk for future cardiovascular  events given her abnormal heart rate recovery and poor exercise  tolerance.   PLAN:  Based on the above, I did give her a prescription for exercise as  I think she could overcome some of these poor results.  She is going to  wear a Holter monitor to evaluate arrhythmias and symptoms that she is  having as described in the office note.  I will see her back in about 1  month or sooner if needed.     Rollene Rotunda, MD, [facility]  Electronically Signed    JH/MedQ  DD: 01/21/2008  DT: 01/22/2008  Job #: [id number]   cc:   Levert Feinstein, MD

## 2010-07-20 NOTE — Assessment & Plan Note (Signed)
OFFICE VISIT   ADAIRA, CENTOLA  DOB:  01/27/1946                                       09/03/2008  ZOXWR#:[id number]   The patient presents today for continued follow-up of her abdominal  incision wound.  She does have an area, approximately 2 cm, of eschar  and fat necrosis.  This was debrided in the office.  She does not have  any evidence of infection or surrounding erythema.  She did have  treatment with a short course of antibiotics which resolved all of the  erythema in her feet, continues to do quite well with no further pain in  her feet.  I debrided this area, instructed them on daily normal saline  dressing.  They will continue this and see me again in 2 weeks for  continued follow-up.   Larina Earthly, M.D.  Electronically Signed   TFE/MEDQ  D:  09/03/2008  T:  09/04/2008  Job:  [id number]

## 2010-07-20 NOTE — Op Note (Signed)
NAMEJOSEY, DETTMANN NO.:  [id number]   MEDICAL RECORD NO.:  [id number]          PATIENT TYPE:  INP   LOCATION:  2312                         FACILITY:  [facility]   PHYSICIAN:  Larina Earthly, M.D.    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   PROCEDURE:  Aorto-bi-common iliac artery bypass with a 12 x 7 Hemashield  graft.   SURGEON:  Larina Earthly, MD   ASSISTANTS:  Balinda Quails, MD and Wilmon Arms, PA-C   ANESTHESIA:  General endotracheal.   COMPLICATIONS:  None.   DISPOSITION:  To recovery room stable.   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  The patient presented to my  office with obvious atheroemboli to both feet left more so than the  right.  Her old arteriogram is showing some moderate irregularity in her  aorta.  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  There was normal caliber of her left common and  external iliac artery with no evidence of thrombus.  On the right, she  had reconstitution of her distal common iliac artery with patent  external and internal iliacs on the right.  Plan had been for  aortofemoral bypass.   PROCEDURE IN DETAIL:  The patient was taken to the operating room and  placed in supine position.  The patient had multiple areas of what  appeared to be follicular irritation and infection in her groin over the  area of incision on both right and left groin.  Due to the patent iliac  arteries,  decision was made to place aortobi-iliac rather than  aortobifemoral bypass for this reason.  She did have a patent left-to-  right fem-fem bypass.  The incision was made from level of the xiphoid  to below the umbilicus and carried down through the midline fascia with  electrocautery.  The peritoneum was entered.  The Omni-Tract retractor  was used for exposure.  The colon, small bowel, stomach, liver, and  gallbladder were normal.  The transverse colon was reflected superiorly  and a small bowel was reflected to the right.  The duodenum was  mobilized off the aorta.  Aorta was encircled at the level of the renal  arteries at the level of the left renal vein.  This was of small  caliber.  Preoperative x-rays showed no evidence of thrombus at this  area.  Dissection was then carried down onto the aortic bifurcation.  The patient had a long common iliac artery bilaterally.  Dissection was  carried down into the pelvis to the level of the iliac artery  bifurcation bilaterally.  The  internal and external iliac arteries were  encircled with vessel loops on the right.  The distal common iliac  artery was encircled on the left.  The patient was given 25 grams of  mannitol and 7000 units of intravenous heparin.  After adequate  circulation time, the aorta was occluded at the level of just below the  renal arteries and the common iliac arteries were ligated bilaterally  with #5 braided nylon sutures.  The aorta was also occluded above the  level of the IMA.  The aorta was transected below the level of the  proximal clamp.  There were several lumbar back bleed and bleeding was  controlled with 2-0 figure-of-eight sutures.  The aorta was opened  longitudinally down to the bifurcation.  There was no bleeding from the  internal iliac artery with chronic occlusion at its takeoff.  The 12 x 7  Hemashield graft was brought onto the field and using a felt strip for  reinforcement it was sewn end-to-end to the aorta with a running 3-0  Prolene suture.  There  was some old atheromatous debris at the area of  the infrarenal aorta and this was removed.  The anastomosis was tested  and found to be adequate.  Next, the left limb of the graft was brought  down to the level of the distal common iliac artery on the left.  The  artery was slightly spatulated as was the graft and the graft was sewn  end-to-end to the common iliac artery on the left with a running 5-0  Prolene suture.  After the usual flushing maneuvers, anastomosis was  completed and flow was restored to the left leg.  This also restored  flow to the prior placed fem-fem bypass.  Next, the attention was turned  to the right iliac.  The external and internal iliac arteries were  occluded with Henley clamps.  The common iliac artery was divided and  was opened down onto the takeoff of the external iliac.  The patient did  have plaque in the posterior artery at this level and this was  endarterectomized and this was tacked with several 5-0 interrupted  Prolene sutures.  The graft was cut to appropriate length, was  spatulated and sewn end-to-end to the junction of the internal and  external iliac arteries on the right with a running 5-0 Prolene suture.  Prior to the usual flush maneuvers, the anastomosis was then completed.  Clamps were removed and flow was restored to both legs.  The patient had  palpable femoral pulses bilaterally.  The patient was given 50 mg of  protamine to reverse heparin.  The Doppler was brought onto the field  and there was excellent flow signals in both left and right renal  arteries.  The retroperitoneum was closed with a running 2-0 Vicryl to  exclude the graft from the intra-abdominal contents.  The small bowel  was run in its entirety and found to be without injury and placed back  in the pelvis.  Transverse colon and omentum were placed over this.  The  midline fascia was closed with #1 PDS suture beginning proximally and  distally and tying in the middle.   Wounds were irrigated with saline,  hemostasis with electrocautery.  The skin was closed with 3-0  subcuticular Vicryl suture.  Sterile dressing was applied.  The patient  was taken to recovery room in stable condition.      Larina Earthly, M.D.  Electronically Signed  TFE/MEDQ  D:  07/29/2008  T:  07/30/2008  Job:  [id number]   cc:   Duncan Dull, M.D.  Rollene Rotunda, MD, [facility]

## 2010-07-20 NOTE — Assessment & Plan Note (Signed)
OFFICE VISIT   JAMILE, REKOWSKI  DOB:  09-23-1945                                       09/19/2008  VHQIO#:[id number]   Patient presents today for continued followup of her aortobiiliac  bypass.  I had seen her on 09/03/08 with a small area of fat necrosis at  the level of her umbilicus, which she is now nearly completely healed  with completely making new skin over this with no drainage.  She  continues to have 2+ femoral pulses.  She does continue to have somewhat  diminished stamina related to her surgery, which is now approximately 6  weeks ago.   She will continue her activity.  I suspect that she will be able to  return to work in several weeks.  We will see her again in 3 months for  final followup.   Larina Earthly, M.D.  Electronically Signed   TFE/MEDQ  D:  09/19/2008  T:  09/22/2008  Job:  [id number]

## 2010-07-20 NOTE — Procedures (Signed)
BYPASS GRAFT EVALUATION   INDICATION:  Follow-up evaluation of bypass graft.  Patient complains of  bilateral claudication at two blocks but not nearly as bad as before  the surgery.   HISTORY:  Diabetes:  No.  Cardiac:  No.  Hypertension:  No.  Smoking:  Yes, one pack per day.  Previous Surgery:  Left-to-right fem-fem BPG on 07/23/07 by Dr. Arbie Cookey.   SINGLE LEVEL ARTERIAL EXAM                               RIGHT              LEFT  Brachial:                    120                120  Anterior tibial:             72 (0.60)          100  Posterior tibial:            68                 118 (0.98)  Peroneal:  Ankle/brachial index:        0.60               0.98   PREVIOUS ABI:  Date: 08/17/07  RIGHT:  0.71  LEFT:  >1.0   LOWER EXTREMITY BYPASS GRAFT DUPLEX EXAM:   DUPLEX:  1. Patent inflow without evidence of stenosis.  2. Patent left-to-right fem-fem BPG with velocities ranging from 48      cm/s - 188 cm/s without evidence of stenosis.  3. Right SFA and popliteal artery velocities are within normal limits.   IMPRESSION:  1. Mild drop in right ankle brachial index.  2. Stable left ankle brachial index.  3. Patent left-to-right femoral-femoral bypass graft.   ___________________________________________  Larina Earthly, M.D.   PB/MEDQ  D:  10/19/2007  T:  10/19/2007  Job:  [id number]

## 2010-11-29 LAB — DIFFERENTIAL
Basophils Relative: 0
Eosinophils Absolute: 0.1
Lymphs Abs: 2
Monocytes Relative: 8
Neutro Abs: 5.8
Neutrophils Relative %: 67

## 2010-11-29 LAB — I-STAT 8, (EC8 V) (CONVERTED LAB)
Acid-Base Excess: 1
Bicarbonate: 26.6 — ABNORMAL HIGH
Glucose, Bld: 104 — ABNORMAL HIGH
Potassium: 3.9
Sodium: 138
TCO2: 28
pH, Ven: 7.372 — ABNORMAL HIGH

## 2010-11-29 LAB — POCT CARDIAC MARKERS
CKMB, poc: <1 — ABNORMAL LOW
CKMB, poc: <1 — ABNORMAL LOW
Myoglobin, poc: 39.7
Operator id: 285841
Troponin i, poc: <0.05
Troponin i, poc: <0.05

## 2010-11-29 LAB — CBC
HCT: 45.3
Hemoglobin: 15.3 — ABNORMAL HIGH
MCHC: 33.8
MCV: 94.8
RBC: 4.78
RDW: 13.6

## 2010-11-29 LAB — COMPREHENSIVE METABOLIC PANEL
BUN: 8
CO2: 27
Calcium: 9.2
GFR calc non Af Amer: >60
Glucose, Bld: 104 — ABNORMAL HIGH
Total Protein: 7

## 2010-11-29 LAB — D-DIMER, QUANTITATIVE: D-Dimer, Quant: 0.44

## 2010-11-29 LAB — POCT I-STAT CREATININE
Creatinine, Ser: 1
Operator id: 285841

## 2010-11-30 LAB — COMPREHENSIVE METABOLIC PANEL
ALT: 20
BUN: 12
CO2: 24
Calcium: 9.2
Creatinine, Ser: 0.71
GFR calc non Af Amer: >60
Glucose, Bld: 96
Sodium: 139
Total Protein: 5.8 — ABNORMAL LOW

## 2010-11-30 LAB — CBC
HCT: 40.4
Hemoglobin: 13.8
MCHC: 34.2
MCV: 93.8
RBC: 4.31
RDW: 12.8

## 2010-11-30 LAB — PROTIME-INR
INR: 0.9
Prothrombin Time: 12.7

## 2010-12-01 LAB — CBC
Hemoglobin: 12.2
MCHC: 35.1
MCV: 95.1
RBC: 3.67 — ABNORMAL LOW
RDW: 13.1

## 2010-12-01 LAB — BASIC METABOLIC PANEL
CO2: 28
Chloride: 104
Creatinine, Ser: 0.77
GFR calc Af Amer: >60
Glucose, Bld: 124 — ABNORMAL HIGH

## 2010-12-01 LAB — TYPE AND SCREEN: ABO/RH(D): O POS

## 2011-05-31 ENCOUNTER — Other Ambulatory Visit: Payer: Self-pay | Admitting: Family Medicine

## 2011-05-31 DIAGNOSIS — Z1231 Encounter for screening mammogram for malignant neoplasm of breast: Secondary | ICD-10-CM

## 2011-06-07 ENCOUNTER — Ambulatory Visit
Admission: RE | Admit: 2011-06-07 | Discharge: 2011-06-07 | Disposition: A | Discharge status: Home / Self Care | Payer: 59 | Source: Ambulatory Visit | Attending: Family Medicine | Admitting: Family Medicine

## 2011-06-07 DIAGNOSIS — Z1231 Encounter for screening mammogram for malignant neoplasm of breast: Secondary | ICD-10-CM

## 2011-12-14 ENCOUNTER — Encounter: Payer: Self-pay | Admitting: Vascular Surgery

## 2012-05-09 ENCOUNTER — Other Ambulatory Visit: Payer: Self-pay

## 2012-05-09 DIAGNOSIS — Z1231 Encounter for screening mammogram for malignant neoplasm of breast: Secondary | ICD-10-CM

## 2012-06-12 ENCOUNTER — Ambulatory Visit: Payer: 59

## 2012-07-16 ENCOUNTER — Ambulatory Visit
Admission: RE | Admit: 2012-07-16 | Discharge: 2012-07-16 | Disposition: A | Discharge status: Home / Self Care | Payer: Medicare Other | Source: Ambulatory Visit

## 2012-07-16 DIAGNOSIS — Z1231 Encounter for screening mammogram for malignant neoplasm of breast: Secondary | ICD-10-CM

## 2012-10-11 ENCOUNTER — Other Ambulatory Visit: Payer: Self-pay | Admitting: Family Medicine

## 2012-10-11 ENCOUNTER — Other Ambulatory Visit (HOSPITAL_COMMUNITY)
Admission: RE | Admit: 2012-10-11 | Discharge: 2012-10-11 | Disposition: A | Discharge status: Home / Self Care | Payer: Medicare Other | Source: Ambulatory Visit | Attending: Family Medicine | Admitting: Family Medicine

## 2012-10-11 DIAGNOSIS — Z124 Encounter for screening for malignant neoplasm of cervix: Secondary | ICD-10-CM | POA: Insufficient documentation

## 2013-09-30 ENCOUNTER — Other Ambulatory Visit: Payer: Self-pay

## 2013-09-30 DIAGNOSIS — Z1231 Encounter for screening mammogram for malignant neoplasm of breast: Secondary | ICD-10-CM

## 2013-10-04 ENCOUNTER — Encounter (INDEPENDENT_AMBULATORY_CARE_PROVIDER_SITE_OTHER): Payer: Self-pay

## 2013-10-04 ENCOUNTER — Ambulatory Visit
Admission: RE | Admit: 2013-10-04 | Discharge: 2013-10-04 | Disposition: A | Discharge status: Home / Self Care | Payer: Medicare Other | Source: Ambulatory Visit

## 2013-10-04 DIAGNOSIS — Z1231 Encounter for screening mammogram for malignant neoplasm of breast: Secondary | ICD-10-CM

## 2014-10-29 ENCOUNTER — Other Ambulatory Visit: Payer: Self-pay | Admitting: Family Medicine

## 2014-10-29 DIAGNOSIS — N6310 Unspecified lump in the right breast, unspecified quadrant: Secondary | ICD-10-CM

## 2014-10-30 ENCOUNTER — Ambulatory Visit
Admission: RE | Admit: 2014-10-30 | Discharge: 2014-10-30 | Disposition: A | Discharge status: Home / Self Care | Payer: Medicare Other | Source: Ambulatory Visit | Attending: Family Medicine | Admitting: Family Medicine

## 2014-10-30 DIAGNOSIS — N6310 Unspecified lump in the right breast, unspecified quadrant: Secondary | ICD-10-CM

## 2015-05-05 ENCOUNTER — Other Ambulatory Visit: Payer: Self-pay | Admitting: Gastroenterology

## 2015-11-04 ENCOUNTER — Other Ambulatory Visit: Payer: Self-pay | Admitting: Family Medicine

## 2015-11-04 DIAGNOSIS — Z1231 Encounter for screening mammogram for malignant neoplasm of breast: Secondary | ICD-10-CM

## 2015-11-11 ENCOUNTER — Ambulatory Visit
Admission: RE | Admit: 2015-11-11 | Discharge: 2015-11-11 | Disposition: A | Discharge status: Home / Self Care | Payer: Medicare Other | Source: Ambulatory Visit | Attending: Family Medicine | Admitting: Family Medicine

## 2015-11-11 DIAGNOSIS — Z1231 Encounter for screening mammogram for malignant neoplasm of breast: Secondary | ICD-10-CM

## 2015-11-12 ENCOUNTER — Ambulatory Visit: Payer: Medicare Other

## 2016-11-10 ENCOUNTER — Other Ambulatory Visit: Payer: Self-pay | Admitting: Family Medicine

## 2016-11-10 DIAGNOSIS — I739 Peripheral vascular disease, unspecified: Secondary | ICD-10-CM

## 2016-11-18 ENCOUNTER — Other Ambulatory Visit: Payer: Medicare Other

## 2016-11-22 ENCOUNTER — Ambulatory Visit
Admission: RE | Admit: 2016-11-22 | Discharge: 2016-11-22 | Disposition: A | Discharge status: Home / Self Care | Payer: Medicare Other | Source: Ambulatory Visit | Attending: Family Medicine | Admitting: Family Medicine

## 2016-11-22 DIAGNOSIS — I739 Peripheral vascular disease, unspecified: Secondary | ICD-10-CM

## 2017-09-05 ENCOUNTER — Other Ambulatory Visit: Payer: Self-pay | Admitting: Family Medicine

## 2017-09-05 DIAGNOSIS — Z1231 Encounter for screening mammogram for malignant neoplasm of breast: Secondary | ICD-10-CM

## 2017-09-26 ENCOUNTER — Ambulatory Visit
Admission: RE | Admit: 2017-09-26 | Discharge: 2017-09-26 | Disposition: A | Discharge status: Home / Self Care | Payer: Medicare Other | Source: Ambulatory Visit | Attending: Family Medicine | Admitting: Family Medicine

## 2017-09-26 DIAGNOSIS — Z1231 Encounter for screening mammogram for malignant neoplasm of breast: Secondary | ICD-10-CM

## 2017-12-15 ENCOUNTER — Other Ambulatory Visit: Payer: Self-pay | Admitting: Physician Assistant

## 2017-12-15 DIAGNOSIS — H905 Unspecified sensorineural hearing loss: Secondary | ICD-10-CM

## 2017-12-15 DIAGNOSIS — H903 Sensorineural hearing loss, bilateral: Secondary | ICD-10-CM

## 2018-01-15 ENCOUNTER — Ambulatory Visit
Admission: RE | Admit: 2018-01-15 | Discharge: 2018-01-15 | Disposition: A | Discharge status: Home / Self Care | Payer: Medicare Other | Source: Ambulatory Visit | Attending: Physician Assistant | Admitting: Physician Assistant

## 2018-01-15 DIAGNOSIS — H903 Sensorineural hearing loss, bilateral: Secondary | ICD-10-CM

## 2018-01-15 DIAGNOSIS — H905 Unspecified sensorineural hearing loss: Secondary | ICD-10-CM

## 2018-01-15 MED ORDER — GADOBENATE DIMEGLUMINE 529 MG/ML IV SOLN
10.0000 mL | Freq: Once | INTRAVENOUS | Status: AC | PRN
  Administered 2018-01-15: 10 mL via INTRAVENOUS

## 2019-04-19 ENCOUNTER — Ambulatory Visit: Payer: Medicare Other | Attending: Internal Medicine

## 2019-04-19 DIAGNOSIS — Z23 Encounter for immunization: Secondary | ICD-10-CM | POA: Insufficient documentation

## 2019-04-19 NOTE — Progress Notes (Signed)
   Covid-19 Vaccination Clinic  Name:  Alejandra Ford    MRN: [id number] DOB: 12-09-1945  04/19/2019  Alejandra Ford was observed post Covid-19 immunization for 15 minutes without incidence. She was provided with Vaccine Information Sheet and instruction to access the V-Safe system.   Alejandra Ford was instructed to call 911 with any severe reactions post vaccine: Marland Kitchen Difficulty breathing  . Swelling of your face and throat  . A fast heartbeat  . A bad rash all over your body  . Dizziness and weakness    Immunizations Administered    Name Date Dose VIS Date Route   Pfizer COVID-19 Vaccine 04/19/2019  4:50 PM 0.3 mL 02/15/2019 Intramuscular   Manufacturer: [place]   Lot: [id number]   [place]: [id number]

## 2019-05-12 ENCOUNTER — Ambulatory Visit: Payer: Medicare Other | Attending: Internal Medicine

## 2019-05-12 DIAGNOSIS — Z23 Encounter for immunization: Secondary | ICD-10-CM | POA: Insufficient documentation

## 2019-05-12 NOTE — Progress Notes (Signed)
   Covid-19 Vaccination Clinic  Name:  Alejandra Ford    MRN: [id number] DOB: 05-May-1945  05/12/2019  [name] was observed post Covid-19 immunization for 15 minutes without incident. She was provided with Vaccine Information Sheet and instruction to access the V-Safe system.   [name] was instructed to call 911 with any severe reactions post vaccine: Marland Kitchen Difficulty breathing  . Swelling of face and throat  . A fast heartbeat  . A bad rash all over body  . Dizziness and weakness   Immunizations Administered    Name Date Dose VIS Date Route   Pfizer COVID-19 Vaccine 05/12/2019 12:34 PM 0.3 mL 02/15/2019 Intramuscular   Manufacturer: [place]   Lot: [id number]   [place]: [id number]

## 2019-10-03 ENCOUNTER — Other Ambulatory Visit: Payer: Self-pay | Admitting: Family Medicine

## 2019-10-03 DIAGNOSIS — Z1231 Encounter for screening mammogram for malignant neoplasm of breast: Secondary | ICD-10-CM

## 2019-10-15 ENCOUNTER — Ambulatory Visit: Payer: Medicare Other

## 2019-10-30 ENCOUNTER — Ambulatory Visit
Admission: RE | Admit: 2019-10-30 | Discharge: 2019-10-30 | Disposition: A | Discharge status: Home / Self Care | Payer: Medicare Other | Source: Ambulatory Visit | Attending: Family Medicine | Admitting: Family Medicine

## 2019-10-30 ENCOUNTER — Other Ambulatory Visit: Payer: Self-pay

## 2019-10-30 DIAGNOSIS — Z1231 Encounter for screening mammogram for malignant neoplasm of breast: Secondary | ICD-10-CM

## 2020-08-27 ENCOUNTER — Other Ambulatory Visit (HOSPITAL_BASED_OUTPATIENT_CLINIC_OR_DEPARTMENT_OTHER): Payer: Self-pay | Admitting: Family Medicine

## 2020-08-27 ENCOUNTER — Other Ambulatory Visit: Payer: Self-pay | Admitting: Family Medicine

## 2020-08-27 ENCOUNTER — Other Ambulatory Visit: Payer: Self-pay

## 2020-08-27 ENCOUNTER — Ambulatory Visit
Admission: RE | Admit: 2020-08-27 | Discharge: 2020-08-27 | Disposition: A | Discharge status: Home / Self Care | Payer: Medicare Other | Source: Ambulatory Visit | Attending: Family Medicine | Admitting: Family Medicine

## 2020-08-27 DIAGNOSIS — R634 Abnormal weight loss: Secondary | ICD-10-CM

## 2020-08-28 ENCOUNTER — Ambulatory Visit (HOSPITAL_BASED_OUTPATIENT_CLINIC_OR_DEPARTMENT_OTHER)
Admission: RE | Admit: 2020-08-28 | Discharge: 2020-08-28 | Disposition: A | Discharge status: Home / Self Care | Payer: Medicare Other | Source: Ambulatory Visit | Attending: Family Medicine | Admitting: Family Medicine

## 2020-08-28 ENCOUNTER — Encounter (HOSPITAL_BASED_OUTPATIENT_CLINIC_OR_DEPARTMENT_OTHER): Payer: Self-pay

## 2020-08-28 DIAGNOSIS — R634 Abnormal weight loss: Secondary | ICD-10-CM | POA: Diagnosis present

## 2020-08-28 DIAGNOSIS — K6389 Other specified diseases of intestine: Secondary | ICD-10-CM | POA: Diagnosis not present

## 2020-08-28 DIAGNOSIS — I700 Atherosclerosis of aorta: Secondary | ICD-10-CM | POA: Diagnosis not present

## 2020-08-28 MED ORDER — IOHEXOL 300 MG/ML  SOLN
75.0000 mL | Freq: Once | INTRAMUSCULAR | Status: AC | PRN
  Administered 2020-08-28: 75 mL via INTRAVENOUS

## 2020-09-08 ENCOUNTER — Inpatient Hospital Stay (HOSPITAL_COMMUNITY)
Admission: EM | Admit: 2020-09-08 | Discharge: 2020-09-11 | DRG: 280 | Disposition: A | Discharge status: Home / Self Care | Payer: Medicare Other | Attending: Internal Medicine | Admitting: Internal Medicine

## 2020-09-08 ENCOUNTER — Encounter (HOSPITAL_COMMUNITY): Payer: Self-pay | Admitting: *Deleted

## 2020-09-08 ENCOUNTER — Emergency Department (HOSPITAL_COMMUNITY): Payer: Medicare Other

## 2020-09-08 ENCOUNTER — Other Ambulatory Visit: Payer: Self-pay

## 2020-09-08 DIAGNOSIS — Z681 Body mass index (BMI) 19 or less, adult: Secondary | ICD-10-CM | POA: Diagnosis not present

## 2020-09-08 DIAGNOSIS — F439 Reaction to severe stress, unspecified: Secondary | ICD-10-CM | POA: Diagnosis present

## 2020-09-08 DIAGNOSIS — Z20822 Contact with and (suspected) exposure to covid-19: Secondary | ICD-10-CM | POA: Diagnosis present

## 2020-09-08 DIAGNOSIS — I739 Peripheral vascular disease, unspecified: Secondary | ICD-10-CM | POA: Diagnosis present

## 2020-09-08 DIAGNOSIS — K5900 Constipation, unspecified: Secondary | ICD-10-CM | POA: Diagnosis present

## 2020-09-08 DIAGNOSIS — D72829 Elevated white blood cell count, unspecified: Secondary | ICD-10-CM | POA: Diagnosis present

## 2020-09-08 DIAGNOSIS — I252 Old myocardial infarction: Secondary | ICD-10-CM | POA: Diagnosis not present

## 2020-09-08 DIAGNOSIS — R634 Abnormal weight loss: Secondary | ICD-10-CM | POA: Diagnosis present

## 2020-09-08 DIAGNOSIS — D649 Anemia, unspecified: Secondary | ICD-10-CM | POA: Diagnosis present

## 2020-09-08 DIAGNOSIS — F17200 Nicotine dependence, unspecified, uncomplicated: Secondary | ICD-10-CM | POA: Diagnosis present

## 2020-09-08 DIAGNOSIS — Z8616 Personal history of COVID-19: Secondary | ICD-10-CM

## 2020-09-08 DIAGNOSIS — E43 Unspecified severe protein-calorie malnutrition: Secondary | ICD-10-CM | POA: Insufficient documentation

## 2020-09-08 DIAGNOSIS — K6389 Other specified diseases of intestine: Secondary | ICD-10-CM | POA: Diagnosis not present

## 2020-09-08 DIAGNOSIS — I25118 Atherosclerotic heart disease of native coronary artery with other forms of angina pectoris: Secondary | ICD-10-CM | POA: Diagnosis present

## 2020-09-08 DIAGNOSIS — I214 Non-ST elevation (NSTEMI) myocardial infarction: Secondary | ICD-10-CM | POA: Diagnosis not present

## 2020-09-08 DIAGNOSIS — K648 Other hemorrhoids: Secondary | ICD-10-CM | POA: Diagnosis present

## 2020-09-08 DIAGNOSIS — Z7982 Long term (current) use of aspirin: Secondary | ICD-10-CM | POA: Diagnosis not present

## 2020-09-08 DIAGNOSIS — Z87891 Personal history of nicotine dependence: Secondary | ICD-10-CM

## 2020-09-08 DIAGNOSIS — D75839 Thrombocytosis, unspecified: Secondary | ICD-10-CM | POA: Diagnosis present

## 2020-09-08 DIAGNOSIS — D630 Anemia in neoplastic disease: Secondary | ICD-10-CM | POA: Diagnosis present

## 2020-09-08 DIAGNOSIS — Z95820 Peripheral vascular angioplasty status with implants and grafts: Secondary | ICD-10-CM | POA: Diagnosis not present

## 2020-09-08 DIAGNOSIS — Z79899 Other long term (current) drug therapy: Secondary | ICD-10-CM | POA: Diagnosis not present

## 2020-09-08 DIAGNOSIS — C182 Malignant neoplasm of ascending colon: Secondary | ICD-10-CM | POA: Diagnosis present

## 2020-09-08 DIAGNOSIS — I10 Essential (primary) hypertension: Secondary | ICD-10-CM | POA: Diagnosis present

## 2020-09-08 DIAGNOSIS — I700 Atherosclerosis of aorta: Secondary | ICD-10-CM | POA: Diagnosis present

## 2020-09-08 DIAGNOSIS — M79602 Pain in left arm: Secondary | ICD-10-CM | POA: Diagnosis present

## 2020-09-08 DIAGNOSIS — E785 Hyperlipidemia, unspecified: Secondary | ICD-10-CM | POA: Diagnosis present

## 2020-09-08 DIAGNOSIS — Z95828 Presence of other vascular implants and grafts: Secondary | ICD-10-CM

## 2020-09-08 DIAGNOSIS — I2510 Atherosclerotic heart disease of native coronary artery without angina pectoris: Secondary | ICD-10-CM | POA: Diagnosis not present

## 2020-09-08 DIAGNOSIS — E871 Hypo-osmolality and hyponatremia: Secondary | ICD-10-CM | POA: Diagnosis present

## 2020-09-08 HISTORY — DX: Hyperlipidemia, unspecified: E78.5

## 2020-09-08 HISTORY — DX: Peripheral vascular disease, unspecified: I73.9

## 2020-09-08 HISTORY — DX: Tobacco use: Z72.0

## 2020-09-08 LAB — BASIC METABOLIC PANEL
Anion gap: 9 (ref 5–15)
BUN: 6 mg/dL — ABNORMAL LOW (ref 8–23)
CO2: 27 mmol/L (ref 22–32)
Calcium: 9.4 mg/dL (ref 8.9–10.3)
Chloride: 93 mmol/L — ABNORMAL LOW (ref 98–111)
Creatinine, Ser: 0.68 mg/dL (ref 0.44–1.00)
GFR, Estimated: >60 mL/min (ref >60)
Glucose, Bld: 111 mg/dL — ABNORMAL HIGH (ref 70–99)
Potassium: 4.2 mmol/L (ref 3.5–5.1)
Sodium: 129 mmol/L — ABNORMAL LOW (ref 135–145)

## 2020-09-08 LAB — TYPE AND SCREEN
ABO/RH(D): O POS
Antibody Screen: NEGATIVE

## 2020-09-08 LAB — TROPONIN I (HIGH SENSITIVITY)
Troponin I (High Sensitivity): 13980 ng/L (ref <18)
Troponin I (High Sensitivity): 13592 ng/L (ref <18)
Troponin I (High Sensitivity): 13401 ng/L (ref <18)
Troponin I (High Sensitivity): 11720 ng/L (ref <18)
Troponin I (High Sensitivity): 12104 ng/L (ref <18)

## 2020-09-08 LAB — CBC
HCT: 36.2 % (ref 36.0–46.0)
Hemoglobin: 11.6 g/dL — ABNORMAL LOW (ref 12.0–15.0)
MCH: 26.5 pg (ref 26.0–34.0)
MCHC: 32 g/dL (ref 30.0–36.0)
MCV: 82.6 fL (ref 80.0–100.0)
Platelets: 557 10*3/uL — ABNORMAL HIGH (ref 150–400)
RBC: 4.38 MIL/uL (ref 3.87–5.11)
RDW: 15.7 % — ABNORMAL HIGH (ref 11.5–15.5)
WBC: 12.1 10*3/uL — ABNORMAL HIGH (ref 4.0–10.5)
nRBC: 0 % (ref 0.0–0.2)

## 2020-09-08 LAB — HEPARIN LEVEL (UNFRACTIONATED): Heparin Unfractionated: <0.1 IU/mL — ABNORMAL LOW (ref 0.30–0.70)

## 2020-09-08 LAB — RESP PANEL BY RT-PCR (FLU A&B, COVID) ARPGX2
Influenza A by PCR: NEGATIVE
Influenza B by PCR: NEGATIVE
SARS Coronavirus 2 by RT PCR: NEGATIVE

## 2020-09-08 MED ORDER — SODIUM CHLORIDE 0.9 % WEIGHT BASED INFUSION
3.0000 mL/kg/h | INTRAVENOUS | Status: DC
  Administered 2020-09-09: 3 mL/kg/h via INTRAVENOUS

## 2020-09-08 MED ORDER — ASPIRIN 81 MG PO CHEW
81.0000 mg | CHEWABLE_TABLET | ORAL | Status: AC
  Administered 2020-09-09: 81 mg via ORAL
  Filled 2020-09-08: qty 1

## 2020-09-08 MED ORDER — SODIUM CHLORIDE 0.9 % WEIGHT BASED INFUSION: 1.0000 mL/kg/h | INTRAVENOUS | Status: DC

## 2020-09-08 MED ORDER — ACETAMINOPHEN 325 MG PO TABS
650.0000 mg | ORAL_TABLET | Freq: Four times a day (QID) | ORAL | Status: DC | PRN
  Administered 2020-09-09: 650 mg via ORAL
  Filled 2020-09-08: qty 2

## 2020-09-08 MED ORDER — METOPROLOL TARTRATE 12.5 MG HALF TABLET
12.5000 mg | ORAL_TABLET | Freq: Two times a day (BID) | ORAL | Status: DC
  Administered 2020-09-08 – 2020-09-11 (×6): 12.5 mg via ORAL
  Filled 2020-09-08 (×6): qty 1

## 2020-09-08 MED ORDER — NITROGLYCERIN IN D5W 200-5 MCG/ML-% IV SOLN: 0.0000 ug/min | INTRAVENOUS | Status: DC

## 2020-09-08 MED ORDER — ACETAMINOPHEN 650 MG RE SUPP: 650.0000 mg | Freq: Four times a day (QID) | RECTAL | Status: DC | PRN

## 2020-09-08 MED ORDER — POLYETHYLENE GLYCOL 3350 17 G PO PACK: 17.0000 g | PACK | Freq: Every day | ORAL | Status: DC | PRN

## 2020-09-08 MED ORDER — ONDANSETRON HCL 4 MG PO TABS: 4.0000 mg | ORAL_TABLET | Freq: Four times a day (QID) | ORAL | Status: DC | PRN

## 2020-09-08 MED ORDER — SODIUM CHLORIDE 0.9% FLUSH: 3.0000 mL | INTRAVENOUS | Status: DC | PRN

## 2020-09-08 MED ORDER — ASPIRIN EC 81 MG PO TBEC
81.0000 mg | DELAYED_RELEASE_TABLET | Freq: Every day | ORAL | Status: DC
  Administered 2020-09-11: 81 mg via ORAL
  Filled 2020-09-08: qty 1

## 2020-09-08 MED ORDER — TRAZODONE HCL 50 MG PO TABS
50.0000 mg | ORAL_TABLET | Freq: Every day | ORAL | Status: DC
  Administered 2020-09-08 – 2020-09-10 (×2): 50 mg via ORAL
  Filled 2020-09-08 (×3): qty 1

## 2020-09-08 MED ORDER — SODIUM CHLORIDE 0.9% FLUSH
3.0000 mL | Freq: Two times a day (BID) | INTRAVENOUS | Status: DC
  Administered 2020-09-09 – 2020-09-10 (×2): 3 mL via INTRAVENOUS

## 2020-09-08 MED ORDER — NITROGLYCERIN 0.4 MG SL SUBL: 0.4000 mg | SUBLINGUAL_TABLET | SUBLINGUAL | Status: DC | PRN

## 2020-09-08 MED ORDER — ATORVASTATIN CALCIUM 80 MG PO TABS
80.0000 mg | ORAL_TABLET | Freq: Every day | ORAL | Status: DC
  Administered 2020-09-08 – 2020-09-11 (×4): 80 mg via ORAL
  Filled 2020-09-08 (×2): qty 1
  Filled 2020-09-08: qty 2
  Filled 2020-09-08: qty 1

## 2020-09-08 MED ORDER — ONDANSETRON HCL 4 MG/2ML IJ SOLN: 4.0000 mg | Freq: Four times a day (QID) | INTRAMUSCULAR | Status: DC | PRN

## 2020-09-08 MED ORDER — HEPARIN BOLUS VIA INFUSION
2900.0000 [IU] | Freq: Once | INTRAVENOUS | Status: AC
  Administered 2020-09-08: 2900 [IU] via INTRAVENOUS
  Filled 2020-09-08: qty 2900

## 2020-09-08 MED ORDER — SODIUM CHLORIDE 0.9 % IV SOLN: 250.0000 mL | INTRAVENOUS | Status: DC | PRN

## 2020-09-08 MED ORDER — HEPARIN (PORCINE) 25000 UT/250ML-% IV SOLN
750.0000 [IU]/h | INTRAVENOUS | Status: DC
  Administered 2020-09-08: 550 [IU]/h via INTRAVENOUS
  Filled 2020-09-08: qty 250

## 2020-09-08 NOTE — Consult Note (Addendum)
Cardiology Consult note:   Patient ID: SWEDEN LESURE MRN: [id number]; DOB: April 16, 1945   Admission date: 09/08/2020  PCP:  Glenis Smoker, MD   [facility] Providers Cardiologist:  New  Chief Complaint:  Chest pain   Patient Profile:   Alejandra Ford is a 75 y.o. female with PAD (follow by Dr. Donnetta Hutching), tobacco abuse, HLD who is being seen 09/08/2020 for the evaluation of chest pain and elevated troponin consistent with non-STEMI at request of Dr. Laverta Baltimore.   Hx of PAD s/p left to right femoral femoral bypass grafting in 2009. 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  History of Present Illness:   Alejandra Ford woke up yesterday morning with left arm pain radiating down.  It's been persistent since then.  In afternoon she started to develop pain chest pressure.  Chest pressure has been intermittent and currently resolved.  Had a worse episode of left arm pain and chest pressure yesterday afternoon.  She was unable to slept overnight due to pain.  Husband brought her here for further evaluation.   High-sensitivity troponin almost 14,000>>>> started on heparin Creatinine normal Potassium 4.2 Chest x-ray without acute abnormality  Patient reported 16 pound weight loss in past 2.5 months.  Longstanding smoking (on and off) since age 60.  Currently in remission.  She is undergoing work-up for weight loss with recent CT finding of colon mass.  She is scheduled to have colonoscopy on July 8.  Patient also reported fatigue and tiredness with sedentary lifestyle recently.  Intermittent palpitation.  She was constipated for past 4 days.  Had large bowel movement (very dark) after MiraLAX.  Past Medical History:  Diagnosis Date   HLD (hyperlipidemia)    PAD (peripheral artery disease) (HCC)    Tobacco abuse    History reviewed. No pertinent surgical history.   Medications Prior to Admission: Prior to Admission medications    Medication Sig Start Date End Date Taking? Authorizing Provider  acetaminophen (TYLENOL) 500 MG tablet Take 500 mg by mouth every 6 (six) hours as needed for moderate pain.   Yes [provider]  aspirin EC 81 MG tablet Take 81 mg by mouth daily. Swallow whole.   Yes [provider]  atorvastatin (LIPITOR) 40 MG tablet Take 40 mg by mouth every evening. 08/25/20  Yes [provider]  Cholecalciferol (VITAMIN D) 50 MCG (2000 UT) CAPS Take 1 capsule by mouth at bedtime.   Yes [provider]  ibuprofen (ADVIL) 200 MG tablet Take 200 mg by mouth every 6 (six) hours as needed.   Yes [provider]  polyethylene glycol (MIRALAX / GLYCOLAX) 17 g packet Take 17 g by mouth daily as needed for moderate constipation.   Yes [provider]  traZODone (DESYREL) 50 MG tablet Take 50 mg by mouth at bedtime. 09/02/20  Yes [provider]     Allergies:   No Known Allergies  Social History:   Social History   Socioeconomic History   Marital status: Married    Spouse name: Not on file   Number of children: Not on file   Years of education: Not on file   Highest education level: Not on file  Occupational History   Not on file  Tobacco Use   Smoking status: Not on file   Smokeless tobacco: Not on file  Substance and Sexual Activity   Alcohol use: Not on file   Drug use: Not on file  Sexual activity: Not on file  Other Topics Concern   Not on file  Social History Narrative   Not on file   Social Determinants of Health   Financial Resource Strain: Not on file  Food Insecurity: Not on file  Transportation Needs: Not on file  Physical Activity: Not on file  Stress: Not on file  Social Connections: Not on file  Intimate Partner Violence: Not on file    Family History:   The patient's family history is not on file.   No family hx of CAD  ROS:  Please see the history of present illness.  All other ROS reviewed and negative.      Physical Exam/Data:   Vitals:   09/08/20 1428 09/08/20 1500 09/08/20 1502 09/08/20 1515  BP: (!) 156/77 (!) 165/87  (!) 158/84  Pulse: 84 84  84  Resp: 18   (!) 21  Temp:      TempSrc:      SpO2: 98% 100%  99%  Weight:   48.1 kg   Height:   5\' 3"  (1.6 m)    No intake or output data in the 24 hours ending 09/08/20 1616 Last 3 Weights 09/08/2020  Weight (lbs) 106 lb  Weight (kg) 48.081 kg     Body mass index is 18.78 kg/m.  General: Thin frail elderly female in no acute distress HEENT: normal Lymph: no adenopathy Neck: no JVD Endocrine:  No thryomegaly Vascular: No carotid bruits; FA pulses 2+ bilaterally without bruits  Cardiac:  normal S1, S2; RRR; no murmur Lungs:  clear to auscultation bilaterally, no wheezing, rhonchi or rales  Abd: soft, nontender, no hepatomegaly  Ext: no edema Musculoskeletal:  No deformities, BUE and BLE strength normal and equal Skin: warm and dry  Neuro:  CNs 2-12 intact, no focal abnormalities noted Psych:  Normal affect    EKG:  The ECG that was done was personally reviewed and demonstrates sinus rhythm with nonspecific T wave abnormality in lateral leads  Relevant CV Studies: As above   Laboratory Data:  High Sensitivity Troponin:   Recent Labs  Lab 09/08/20 1310 09/08/20 1502  TROPONINIHS 13,980* 13,592*      Chemistry Recent Labs  Lab 09/08/20 1310  NA 129*  K 4.2  CL 93*  CO2 27  GLUCOSE 111*  BUN 6*  CREATININE 0.68  CALCIUM 9.4  GFRNONAA >60  ANIONGAP 9    Hematology Recent Labs  Lab 09/08/20 1310  WBC 12.1*  RBC 4.38  HGB 11.6*  HCT 36.2  MCV 82.6  MCH 26.5  MCHC 32.0  RDW 15.7*  PLT 557*    Radiology/Studies:  DG Chest 2 View  Result Date: 09/08/2020 CLINICAL DATA:  Chest pain. EXAM: CHEST - 2 VIEW COMPARISON:  CT 08/28/2020.  Chest x-ray 08/27/2020. FINDINGS: Mediastinum hilar structures normal. No focal infiltrate. Previously described pulmonary nodules best identified by prior CT. No pleural  effusion or pneumothorax. Heart size normal. No acute bony abnormality. IMPRESSION: No acute cardiopulmonary disease. Electronically Signed   By: Marcello Moores  Register   On: 09/08/2020 13:35     Assessment and Plan:   NSTEMI - HS-troponin 75102>>[id number]. EKG with inferior lateral TWI. - Continue IV heparin - Continue ASA 81mg  qd - Increase home Lipitor to 80mg  qd -Seems patient has completed infract yesterday afternoon when she had a worse pain. -Cath tomorrow at 10:30am (Likely diagnostic).  - Get echo  2. HLD - Check Lipid panel - Lipitor 80mg  qd  3. PAD -  Followed by vascular   4.  Colon mass with possible malignancy  -She CT scan report 08/28/2020 -Recommended internal medicine admission with GI consult (prefer today) -Sent secure message to Dr. Laverta Baltimore  Risk Assessment/Risk Scores:   TIMI Risk Score for Unstable Angina or Non-ST Elevation MI:   The patient's TIMI risk score is 5, which indicates a 26% risk of all cause mortality, new or recurrent myocardial infarction or need for urgent revascularization in the next 14 days.{   For questions or updates, please contact [place] Please consult [email or web address] for contact info under     Jarrett Soho, PA  09/08/2020 4:16 PM    I have personally seen and examined this patient. I agree with the assessment and plan as outlined above.  Alejandra Ford is a 75 yo female with history of tobacco abuse, PAD and hyperlipidemia who is presenting to the ED today with c/o chest and left arm pain. She began having severe chest and arm pain yesterday. The pain has waxed and waned since yesterday. Her arm pain persisted through the night and is still present today. Her initial troponin is 13,980 and trending down to 13,592. EKG with inferolateral TWI. No injury current noted.  She also describes recent weight loss. She had a recent CT abdomen that showed a very large colon mass. She has had recent dark bowel movements.  My exam:   General: Well developed, well nourished, NAD  HEENT: OP clear, mucus membranes moist  SKIN: warm, dry. No rashes. Neuro: No focal deficits  Musculoskeletal: Muscle strength 5/5 all ext  Psychiatric: Mood and affect normal  Neck: No JVD, no carotid bruits, no thyromegaly, no lymphadenopathy.  Lungs:Clear bilaterally, no wheezes, rhonci, crackles Cardiovascular: Regular rate and rhythm. No murmurs, gallops or rubs. Abdomen:Soft. Bowel sounds present. Non-tender.  Extremities: No lower extremity edema. Pulses are 2 + in the bilateral DP/PT.  Plan:  NSTEMI/Late presenting MI: She most likely had an acute cardiac event yesterday. Her troponin is elevated and now flat and trending down. She is comfortable with no chest pain now, some left arm pain. She will need a cardiac cath but this will be planned after she has been seen by GI for the colon mass. Agree with IV heparin tonight. If she were to become unstable, we would need to proceed with a more urgent cardiac cath. Echo tomorrow. NPO at midnight for possible cardiac cath tomorrow.  Colon mass: We will ask the hospitalist service to admit her to facilitate workup for probable colon cancer. As above, if we elect to proceed with cardiac cath, she would most likely need to be on DAPT post stenting and this would prohibit surgical intervention on her colon mass.  Lauree Chandler 09/08/2020 5:19 PM

## 2020-09-08 NOTE — Progress Notes (Signed)
ANTICOAGULATION CONSULT NOTE - Initial Consult  Pharmacy Consult for heparin Indication: chest pain/ACS  No Known Allergies  Patient Measurements: Height: 5\' 3"  (160 cm) Weight: 48.1 kg (106 lb) IBW/kg (Calculated) : 52.4 Heparin Dosing Weight: TBW  Vital Signs: Temp: 98.7 F (37.1 C) (07/05 1300) Temp Source: Oral (07/05 1300) BP: 156/77 (07/05 1428) Pulse Rate: 84 (07/05 1428)  Labs: Recent Labs    09/08/20 1310  HGB 11.6*  HCT 36.2  PLT 557*  CREATININE 0.68  TROPONINIHS 13,980*    Estimated Creatinine Clearance: 47.6 mL/min (by C-G formula based on SCr of 0.68 mg/dL).   Medical History: History reviewed. No pertinent past medical history.  Assessment: 54 YOF presenting with CP, elevated troponin, she is not on anticoagulation PTA  Goal of Therapy:  Heparin level 0.3-0.7 units/ml Monitor platelets by anticoagulation protocol: Yes   Plan:  Heparin 2900 units IV x 1, and gtt at 550 units/hr F/u heparin level in 8 hours F/u cards eval and recs  Bertis Ruddy, PharmD Clinical Pharmacist ED Pharmacist Phone # [phone] 09/08/2020 3:08 PM

## 2020-09-08 NOTE — ED Provider Notes (Signed)
75 year old female who was seen initially by Dr. Laverta Baltimore who spoke to cardiology due to the presence of non-STEMI.  Plan was for them to admit however patient has a significant colon mass which needs medical evaluation.  Cardiology request that I speak with for admission and they will manage the patient's NSTEMI.   Lacretia Leigh, MD 09/08/20 1723

## 2020-09-08 NOTE — H&P (Signed)
[facility] History and Physical   Patient: Alejandra Ford ONG:[id number]   PCP: Glenis Smoker, MD DOB: October 08, 1945   DOA: 09/08/2020   DOS: 09/08/2020   DOS: the patient was seen and examined on 09/08/2020  Patient coming from: The patient is coming from Home  Chief Complaint: Chest pain radiating to left arm  HPI: Alejandra Ford is a 75 y.o. female with Past medical history of PAD SP left right femorofemoral bypass and aorto by common iliac artery bypass, former smoker, HLD, COVID-19 in October 2021. Patient presented with complaints of chest pain radiating to her left arm.  She woke up with this pain yesterday morning.  Patient has persistent pain since then.  Unable to sleep overnight due to pain.  Due to ongoing symptoms without any resolution patient was brought to the hospital for further work-up. At the time of my evaluation she denies any shortness of breath, nausea, vomiting, dizziness.  No fever no chills no cough.  No diarrhea no constipation.  No swelling in the leg.  No pain in the leg.  Denies any claudication symptoms. She reports that she had similar chest pain in the past spread over months, resolved on its own. Patient is taking aspirin prior to admission.  Patient lost 16 pounds in last 2 months.  Outpatient work-up with CT chest abdomen pelvis shows evidence of a colonic mass.  Scheduled with [facility] for colonoscopy on 09/11/2020.  Denies any BRBPR or melena.  Had a dark brown BM yesterday.  ED Course: Presents with chest pain with left arm radiation.  Troponins 13,000.  EKG with inferolateral T wave inversion.   Review of Systems: as mentioned in the history of present illness.  All other systems reviewed and are negative.  Past Medical History:  Diagnosis Date   HLD (hyperlipidemia)    PAD (peripheral artery disease) (HCC)    Tobacco abuse    History reviewed. No pertinent surgical history. Social History:  has no history on file for tobacco use, alcohol use,  and drug use.  No Known Allergies  Family history reviewed and not family history of malignancy or CAD    Prior to Admission medications   Medication Sig Start Date End Date Taking? Authorizing Provider  acetaminophen (TYLENOL) 500 MG tablet Take 500 mg by mouth every 6 (six) hours as needed for moderate pain.   Yes [provider]  aspirin EC 81 MG tablet Take 81 mg by mouth daily. Swallow whole.   Yes [provider]  atorvastatin (LIPITOR) 40 MG tablet Take 40 mg by mouth every evening. 08/25/20  Yes [provider]  Cholecalciferol (VITAMIN D) 50 MCG (2000 UT) CAPS Take 1 capsule by mouth at bedtime.   Yes [provider]  ibuprofen (ADVIL) 200 MG tablet Take 200 mg by mouth every 6 (six) hours as needed.   Yes [provider]  polyethylene glycol (MIRALAX / GLYCOLAX) 17 g packet Take 17 g by mouth daily as needed for moderate constipation.   Yes [provider]  traZODone (DESYREL) 50 MG tablet Take 50 mg by mouth at bedtime. 09/02/20  Yes [provider]    Physical Exam: Vitals:   09/08/20 1645 09/08/20 1700 09/08/20 1745 09/08/20 1800  BP: (!) 161/80 (!) 165/96 (!) 173/86 (!) 157/90  Pulse: 84 86 88 88  Resp: (!) 21 19 19 18   Temp:      TempSrc:      SpO2: 99% 100% 100% 98%  Weight:      Height:        General: alert and oriented to time, place, and person. Appear in mild distress, affect appropriate Eyes: PERRL, Conjunctiva normal ENT: Oral Mucosa Clear, moist  Neck: no JVD, no Abnormal Mass Or lumps Cardiovascular: S1 and S2 Present, no Murmur, peripheral pulses symmetrical Respiratory: good respiratory effort, Bilateral Air entry equal and Decreased, no of accessory muscle use, Clear to Auscultation, no Crackles, no wheezes Abdomen: Bowel Sound present, Soft and no tenderness, no hernia Skin: no rashes  Extremities: no Pedal edema, no calf tenderness Neurologic: without any new focal findings Gait not  checked due to patient safety concerns  Data Reviewed: I have personally reviewed and interpreted labs, imaging as discussed below.  CBC: Recent Labs  Lab 09/08/20 1310  WBC 12.1*  HGB 11.6*  HCT 36.2  MCV 82.6  PLT 998*   Basic Metabolic Panel: Recent Labs  Lab 09/08/20 1310  NA 129*  K 4.2  CL 93*  CO2 27  GLUCOSE 111*  BUN 6*  CREATININE 0.68  CALCIUM 9.4   GFR: Estimated Creatinine Clearance: 47.6 mL/min (by C-G formula based on SCr of 0.68 mg/dL). Liver Function Tests: No results for input(s): AST, ALT, ALKPHOS, BILITOT, PROT, ALBUMIN in the last 168 hours. No results for input(s): LIPASE, AMYLASE in the last 168 hours. No results for input(s): AMMONIA in the last 168 hours. Coagulation Profile: No results for input(s): INR, PROTIME in the last 168 hours. Cardiac Enzymes: No results for input(s): CKTOTAL, CKMB, CKMBINDEX, TROPONINI in the last 168 hours. BNP (last 3 results) No results for input(s): PROBNP in the last 8760 hours. HbA1C: No results for input(s): HGBA1C in the last 72 hours. CBG: No results for input(s): GLUCAP in the last 168 hours. Lipid Profile: No results for input(s): CHOL, HDL, LDLCALC, TRIG, CHOLHDL, LDLDIRECT in the last 72 hours. Thyroid Function Tests: No results for input(s): TSH, T4TOTAL, FREET4, T3FREE, THYROIDAB in the last 72 hours. Anemia Panel: No results for input(s): VITAMINB12, FOLATE, FERRITIN, TIBC, IRON, RETICCTPCT in the last 72 hours. Urine analysis:    Component Value Date/Time   COLORURINE YELLOW 07/28/2008 1316   APPEARANCEUR CLEAR 07/28/2008 1316   LABSPEC 1.005 07/28/2008 1316   PHURINE 6.0 07/28/2008 1316   GLUCOSEU NEGATIVE 07/28/2008 1316   HGBUR NEGATIVE 07/28/2008 1316   BILIRUBINUR NEGATIVE 07/28/2008 1316   KETONESUR NEGATIVE 07/28/2008 1316   PROTEINUR NEGATIVE 07/28/2008 1316   UROBILINOGEN 0.2 07/28/2008 1316   NITRITE NEGATIVE 07/28/2008 1316   LEUKOCYTESUR  07/28/2008 1316    NEGATIVE  MICROSCOPIC NOT DONE ON URINES WITH NEGATIVE PROTEIN, BLOOD, LEUKOCYTES, NITRITE, OR GLUCOSE <1000 mg/dL.    Radiological Exams on Admission: DG Chest 2 View  Result Date: 09/08/2020 CLINICAL DATA:  Chest pain. EXAM: CHEST - 2 VIEW COMPARISON:  CT 08/28/2020.  Chest x-ray 08/27/2020. FINDINGS: Mediastinum hilar structures normal. No focal infiltrate. Previously described pulmonary nodules best identified by prior CT. No pleural effusion or pneumothorax. Heart size normal. No acute bony abnormality. IMPRESSION: No acute cardiopulmonary disease. Electronically Signed   By: Marcello Moores  Register   On: 09/08/2020 13:35   EKG: Independently reviewed. normal sinus rhythm, nonspecific ST and T waves changes. Echocardiogram: Pending  I reviewed all nursing notes, pharmacy notes, vitals, pertinent old records.  Assessment/Plan 1. NSTEMI (non-ST elevated myocardial infarction) [facility]) Cardiology consulted. Comfortable with no chest pain right now. Has some left arm pain. Initially plan was to admit to cardiology with cardiac catheterization given  history of colonic mass currently plan work-up Currently on IV heparin. Monitor on progressive care unit. N.p.o. except medication. Echocardiogram tomorrow. 81 mg aspirin 80 mg Lipitor. Lopressor 12.5 twice daily.  With holding parameters. Nitroglycerin as needed.  2.  Blood pressure elevated. Nitroglycerin as needed. Lopressor twice daily.  3.  Colonic mass. Scheduled to undergo colonoscopy with [facility] gastroenterology. I have consolidated GI. Monitor recommendation. Currently NPO. Patient is on IV heparin and remains at risk of bleeding.  This risk was explained to the patient Type and screen.  4.  Anemia.  Likely chronic because Hemoglobin 11.6. Monitor CBC. Transfuse for hemoglobin less than 9.  5.  Unintentional weight loss. CT abdomen highly concerning for colonic malignancy Work-up initiated.  6.  HLD. Currently on Lipitor.  7.   Leukocytosis. Likely stress reaction. Monitor.   Principal Problem:   NSTEMI (non-ST elevated myocardial infarction) [facility]) Active Problems:   Essential hypertension   Anemia   Colonic mass   Unintended weight loss   Hyperlipidemia   Hyponatremia   Leucocytosis  Nutrition: NPO except ice chips and sips of water. DVT Prophylaxis: Therapeutic Anticoagulation with heparin  Advance goals of care discussion: Full code, her husband will be her POA.  Consults: I personally Discussed with [facility].  EDP discussed with cardiology.  Family Communication: family was present at bedside, at the time of interview.  Opportunity was given to ask question and all questions were answered satisfactorily.   Disposition:  From: Home Likely will need Home on discharge.   Author: Berle Mull, MD [facility] 09/08/2020 6:18 PM   To reach On-call, see care teams to locate the attending and reach out to them via [email or web address]. If 7PM-7AM, please contact night-coverage If you still have difficulty reaching the attending provider, please page the [employer] (Director on Call) for [facility] on amion for assistance.

## 2020-09-08 NOTE — ED Triage Notes (Signed)
Pt arrived by [facility] from home. Reports onset of left arm pain yesterday that began radiating to her chest. Chest pain improved with rest but is still having left arm pain. Pt took ASA pta.

## 2020-09-08 NOTE — ED Provider Notes (Signed)
Emergency Medicine Provider Triage Evaluation Note  Alejandra Ford , a 75 y.o. female  was evaluated in triage.  Pt complains of left sharp arm pain that woke her up from her sleep yesterday, radiating into her left neck.  Apply heating pad which made her pain better.  Now endorses continued pain to the left arm, chest tightness as well.  No prior cardiac history, no family history of CAD.  Review of Systems  Positive: Chest pain, left arm pain  Negative: Fever, shortness of breath  Physical Exam  BP (!) 155/77   Pulse 79   Temp 98.7 F (37.1 C) (Oral)   Resp 14   SpO2 100%  Gen:   Awake, no distress   Resp:  Normal effort  MSK:   Moves extremities without difficulty  Other:    Medical Decision Making  Medically screening exam initiated at 1:05 PM.  Appropriate orders placed.  ANITHA KREISER was informed that the remainder of the evaluation will be completed by another provider, this initial triage assessment does not replace that evaluation, and the importance of remaining in the ED until their evaluation is complete.     Janeece Fitting, PA-C 09/08/20 1308    Lucrezia Starch, MD 09/09/20 [phone]

## 2020-09-08 NOTE — ED Provider Notes (Signed)
Emergency Department Provider Note   I have reviewed the triage vital signs and the nursing notes.   HISTORY  Chief Complaint Chest Pain   HPI Alejandra Ford is a 75 y.o. female with past medical history reviewed below presents to the emergency department with intermittent chest discomfort worsening since yesterday.  Patient describes occasional feeling of skipping heartbeats but also some associated left chest tightness.  Pain is slightly improved with lying flat.  She denies any active pressure/tightness symptoms.  Denies shortness of breath.  No leg swelling.  No similar pain in the past.  She states that she has been seeing her primary care doctor with some unintentional weight loss and lab abnormalities.  She has been referred to a cardiologist but does not have an appointment until August.  No prior history of heart attack.   History reviewed. No pertinent past medical history.  There are no problems to display for this patient.   History reviewed. No pertinent surgical history.  Allergies Patient has no known allergies.  History reviewed. No pertinent family history.  Social History    Review of Systems  Constitutional: No fever/chills Eyes: No visual changes. ENT: No sore throat. Cardiovascular: Positive chest pain. Respiratory: Denies shortness of breath. Gastrointestinal: No abdominal pain.  No nausea, no vomiting.  No diarrhea.  No constipation. Genitourinary: Negative for dysuria. Musculoskeletal: Negative for back pain. Skin: Negative for rash. Neurological: Negative for headaches, focal weakness or numbness.  10-point ROS otherwise negative.  ____________________________________________   PHYSICAL EXAM:  VITAL SIGNS: ED Triage Vitals  Enc Vitals Group     BP 09/08/20 1300 (!) 155/77     Pulse Rate 09/08/20 1300 79     Resp 09/08/20 1300 14     Temp 09/08/20 1300 98.7 F (37.1 C)     Temp Source 09/08/20 1300 Oral     SpO2 09/08/20 1253 98 %      Weight 09/08/20 1502 106 lb (48.1 kg)     Height 09/08/20 1502 5\' 3"  (1.6 m)   Constitutional: Alert and oriented. Well appearing and in no acute distress. Eyes: Conjunctivae are normal. Head: Atraumatic. Nose: No congestion/rhinnorhea. Mouth/Throat: Mucous membranes are moist.   Neck: No stridor.   Cardiovascular: Normal rate, regular rhythm. Good peripheral circulation. Grossly normal heart sounds.   Respiratory: Normal respiratory effort.  No retractions. Lungs CTAB. Gastrointestinal: Soft and nontender. No distention.  Musculoskeletal: No lower extremity tenderness nor edema. No gross deformities of extremities. Neurologic:  Normal speech and language. No gross focal neurologic deficits are appreciated.  Skin:  Skin is warm, dry and intact. No rash noted.  ____________________________________________   LABS (all labs ordered are listed, but only abnormal results are displayed)  Labs Reviewed  BASIC METABOLIC PANEL - Abnormal; Notable for the following components:      Result Value   Sodium 129 (*)    Chloride 93 (*)    Glucose, Bld 111 (*)    BUN 6 (*)    All other components within normal limits  CBC - Abnormal; Notable for the following components:   WBC 12.1 (*)    Hemoglobin 11.6 (*)    RDW 15.7 (*)    Platelets 557 (*)    All other components within normal limits  TROPONIN I (HIGH SENSITIVITY) - Abnormal; Notable for the following components:   Troponin I (High Sensitivity) 13,980 (*)    All other components within normal limits  RESP PANEL BY RT-PCR (FLU A&B, COVID) ARPGX2  HEPARIN LEVEL (UNFRACTIONATED)  TROPONIN I (HIGH SENSITIVITY)   ____________________________________________  EKG   EKG Interpretation  Date/Time:  Tuesday September 08 2020 12:57:54 EDT Ventricular Rate:  78 PR Interval:  120 QRS Duration: 76 QT Interval:  368 QTC Calculation: 419 R Axis:   77 Text Interpretation: Normal sinus rhythm Possible Left atrial enlargement T wave  abnormality, consider lateral ischemia Abnormal ECG Confirmed by Madalyn Rob [phone]) on 09/08/2020 1:37:51 PM         ____________________________________________  RADIOLOGY  DG Chest 2 View  Result Date: 09/08/2020 CLINICAL DATA:  Chest pain. EXAM: CHEST - 2 VIEW COMPARISON:  CT 08/28/2020.  Chest x-ray 08/27/2020. FINDINGS: Mediastinum hilar structures normal. No focal infiltrate. Previously described pulmonary nodules best identified by prior CT. No pleural effusion or pneumothorax. Heart size normal. No acute bony abnormality. IMPRESSION: No acute cardiopulmonary disease. Electronically Signed   By: Marcello Moores  Register   On: 09/08/2020 13:35    ____________________________________________   PROCEDURES  Procedure(s) performed:   Procedures  CRITICAL CARE Performed by: Margette Fast Total critical care time: 35 minutes Critical care time was exclusive of separately billable procedures and treating other patients. Critical care was necessary to treat or prevent imminent or life-threatening deterioration. Critical care was time spent personally by me on the following activities: development of treatment plan with patient and/or surrogate as well as nursing, discussions with consultants, evaluation of patient's response to treatment, examination of patient, obtaining history from patient or surrogate, ordering and performing treatments and interventions, ordering and review of laboratory studies, ordering and review of radiographic studies, pulse oximetry and re-evaluation of patient's condition.  Nanda Quinton, MD Emergency Medicine  ____________________________________________   INITIAL IMPRESSION / ASSESSMENT AND PLAN / ED COURSE  Pertinent labs & imaging results that were available during my care of the patient were reviewed by me and considered in my medical decision making (see chart for details).   Patient presents to the emergency department with intermittent chest pain  over the past 24 hours.  She is not having active pain.  She was seen in triage during medical screening exam and had a troponin which came back at 13,000.  Repeat EKG reviewed as well as her triage EKG.  I not appreciate a STEMI.   Discussed the case with cardiology who will admit.  We will start heparin.   Discussed patient's case with Cardiology to request admission. Patient and family (if present) updated with plan. Care transferred to Cardiology service.  I reviewed all nursing notes, vitals, pertinent old records, EKGs, labs, imaging (as available).    ____________________________________________  FINAL CLINICAL IMPRESSION(S) / ED DIAGNOSES  Final diagnoses:  NSTEMI (non-ST elevated myocardial infarction) ([place])     MEDICATIONS GIVEN DURING THIS VISIT:  Medications  heparin ADULT infusion 100 units/mL (25000 units/270mL) (550 Units/hr Intravenous New Bag/Given 09/08/20 1531)  heparin bolus via infusion 2,900 Units (2,900 Units Intravenous Bolus from Bag 09/08/20 1531)     Note:  This document was prepared using Dragon voice recognition software and may include unintentional dictation errors.  Nanda Quinton, MD, [facility] Emergency Medicine    Demondre Aguas, [facility], MD 09/08/20 [phone]

## 2020-09-08 NOTE — H&P (View-Only) (Signed)
Cardiology Consult note:   Patient ID: DHRUVI CRENSHAW MRN: [id number]; DOB: December 31, 1945   Admission date: 09/08/2020  PCP:  Glenis Smoker, MD   [facility] Providers Cardiologist:  New  Chief Complaint:  Chest pain   Patient Profile:   Alejandra Ford is a 75 y.o. female with PAD (follow by Dr. Donnetta Hutching), tobacco abuse, HLD who is being seen 09/08/2020 for the evaluation of chest pain and elevated troponin consistent with non-STEMI at request of Dr. Laverta Baltimore.   Hx of PAD s/p left to right femoral femoral bypass grafting in 2009. 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  History of Present Illness:   Alejandra Ford woke up yesterday morning with left arm pain radiating down.  It's been persistent since then.  In afternoon she started to develop pain chest pressure.  Chest pressure has been intermittent and currently resolved.  Had a worse episode of left arm pain and chest pressure yesterday afternoon.  She was unable to slept overnight due to pain.  Husband brought her here for further evaluation.   High-sensitivity troponin almost 14,000>>>> started on heparin Creatinine normal Potassium 4.2 Chest x-ray without acute abnormality  Patient reported 16 pound weight loss in past 2.5 months.  Longstanding smoking (on and off) since age 75.  Currently in remission.  She is undergoing work-up for weight loss with recent CT finding of colon mass.  She is scheduled to have colonoscopy on July 8.  Patient also reported fatigue and tiredness with sedentary lifestyle recently.  Intermittent palpitation.  She was constipated for past 4 days.  Had large bowel movement (very dark) after MiraLAX.  Past Medical History:  Diagnosis Date   HLD (hyperlipidemia)    PAD (peripheral artery disease) (HCC)    Tobacco abuse    History reviewed. No pertinent surgical history.   Medications Prior to Admission: Prior to Admission medications    Medication Sig Start Date End Date Taking? Authorizing Provider  acetaminophen (TYLENOL) 500 MG tablet Take 500 mg by mouth every 6 (six) hours as needed for moderate pain.   Yes [provider]  aspirin EC 81 MG tablet Take 81 mg by mouth daily. Swallow whole.   Yes [provider]  atorvastatin (LIPITOR) 40 MG tablet Take 40 mg by mouth every evening. 08/25/20  Yes [provider]  Cholecalciferol (VITAMIN D) 50 MCG (2000 UT) CAPS Take 1 capsule by mouth at bedtime.   Yes [provider]  ibuprofen (ADVIL) 200 MG tablet Take 200 mg by mouth every 6 (six) hours as needed.   Yes [provider]  polyethylene glycol (MIRALAX / GLYCOLAX) 17 g packet Take 17 g by mouth daily as needed for moderate constipation.   Yes [provider]  traZODone (DESYREL) 50 MG tablet Take 50 mg by mouth at bedtime. 09/02/20  Yes [provider]     Allergies:   No Known Allergies  Social History:   Social History   Socioeconomic History   Marital status: Married    Spouse name: Not on file   Number of children: Not on file   Years of education: Not on file   Highest education level: Not on file  Occupational History   Not on file  Tobacco Use   Smoking status: Not on file   Smokeless tobacco: Not on file  Substance and Sexual Activity   Alcohol use: Not on file   Drug use: Not on file  Sexual activity: Not on file  Other Topics Concern   Not on file  Social History Narrative   Not on file   Social Determinants of Health   Financial Resource Strain: Not on file  Food Insecurity: Not on file  Transportation Needs: Not on file  Physical Activity: Not on file  Stress: Not on file  Social Connections: Not on file  Intimate Partner Violence: Not on file    Family History:   The patient's family history is not on file.   No family hx of CAD  ROS:  Please see the history of present illness.  All other ROS reviewed and negative.      Physical Exam/Data:   Vitals:   09/08/20 1428 09/08/20 1500 09/08/20 1502 09/08/20 1515  BP: (!) 156/77 (!) 165/87  (!) 158/84  Pulse: 84 84  84  Resp: 18   (!) 21  Temp:      TempSrc:      SpO2: 98% 100%  99%  Weight:   48.1 kg   Height:   5\' 3"  (1.6 m)    No intake or output data in the 24 hours ending 09/08/20 1616 Last 3 Weights 09/08/2020  Weight (lbs) 106 lb  Weight (kg) 48.081 kg     Body mass index is 18.78 kg/m.  General: Thin frail elderly female in no acute distress HEENT: normal Lymph: no adenopathy Neck: no JVD Endocrine:  No thryomegaly Vascular: No carotid bruits; FA pulses 2+ bilaterally without bruits  Cardiac:  normal S1, S2; RRR; no murmur Lungs:  clear to auscultation bilaterally, no wheezing, rhonchi or rales  Abd: soft, nontender, no hepatomegaly  Ext: no edema Musculoskeletal:  No deformities, BUE and BLE strength normal and equal Skin: warm and dry  Neuro:  CNs 2-12 intact, no focal abnormalities noted Psych:  Normal affect    EKG:  The ECG that was done was personally reviewed and demonstrates sinus rhythm with nonspecific T wave abnormality in lateral leads  Relevant CV Studies: As above   Laboratory Data:  High Sensitivity Troponin:   Recent Labs  Lab 09/08/20 1310 09/08/20 1502  TROPONINIHS 13,980* 13,592*      Chemistry Recent Labs  Lab 09/08/20 1310  NA 129*  K 4.2  CL 93*  CO2 27  GLUCOSE 111*  BUN 6*  CREATININE 0.68  CALCIUM 9.4  GFRNONAA >60  ANIONGAP 9    Hematology Recent Labs  Lab 09/08/20 1310  WBC 12.1*  RBC 4.38  HGB 11.6*  HCT 36.2  MCV 82.6  MCH 26.5  MCHC 32.0  RDW 15.7*  PLT 557*    Radiology/Studies:  DG Chest 2 View  Result Date: 09/08/2020 CLINICAL DATA:  Chest pain. EXAM: CHEST - 2 VIEW COMPARISON:  CT 08/28/2020.  Chest x-ray 08/27/2020. FINDINGS: Mediastinum hilar structures normal. No focal infiltrate. Previously described pulmonary nodules best identified by prior CT. No pleural  effusion or pneumothorax. Heart size normal. No acute bony abnormality. IMPRESSION: No acute cardiopulmonary disease. Electronically Signed   By: Marcello Moores  Register   On: 09/08/2020 13:35     Assessment and Plan:   NSTEMI - HS-troponin 47829>>[id number]. EKG with inferior lateral TWI. - Continue IV heparin - Continue ASA 81mg  qd - Increase home Lipitor to 80mg  qd -Seems patient has completed infract yesterday afternoon when she had a worse pain. -Cath tomorrow at 10:30am (Likely diagnostic).  - Get echo  2. HLD - Check Lipid panel - Lipitor 80mg  qd  3. PAD -  Followed by vascular   4.  Colon mass with possible malignancy  -She CT scan report 08/28/2020 -Recommended internal medicine admission with GI consult (prefer today) -Sent secure message to Dr. Laverta Baltimore  Risk Assessment/Risk Scores:   TIMI Risk Score for Unstable Angina or Non-ST Elevation MI:   The patient's TIMI risk score is 5, which indicates a 26% risk of all cause mortality, new or recurrent myocardial infarction or need for urgent revascularization in the next 14 days.{   For questions or updates, please contact [place] Please consult [email or web address] for contact info under     Jarrett Soho, PA  09/08/2020 4:16 PM    I have personally seen and examined this patient. I agree with the assessment and plan as outlined above.  Alejandra Ford is a 75 yo female with history of tobacco abuse, PAD and hyperlipidemia who is presenting to the ED today with c/o chest and left arm pain. She began having severe chest and arm pain yesterday. The pain has waxed and waned since yesterday. Her arm pain persisted through the night and is still present today. Her initial troponin is 13,980 and trending down to 13,592. EKG with inferolateral TWI. No injury current noted.  She also describes recent weight loss. She had a recent CT abdomen that showed a very large colon mass. She has had recent dark bowel movements.  My exam:   General: Well developed, well nourished, NAD  HEENT: OP clear, mucus membranes moist  SKIN: warm, dry. No rashes. Neuro: No focal deficits  Musculoskeletal: Muscle strength 5/5 all ext  Psychiatric: Mood and affect normal  Neck: No JVD, no carotid bruits, no thyromegaly, no lymphadenopathy.  Lungs:Clear bilaterally, no wheezes, rhonci, crackles Cardiovascular: Regular rate and rhythm. No murmurs, gallops or rubs. Abdomen:Soft. Bowel sounds present. Non-tender.  Extremities: No lower extremity edema. Pulses are 2 + in the bilateral DP/PT.  Plan:  NSTEMI/Late presenting MI: She most likely had an acute cardiac event yesterday. Her troponin is elevated and now flat and trending down. She is comfortable with no chest pain now, some left arm pain. She will need a cardiac cath but this will be planned after she has been seen by GI for the colon mass. Agree with IV heparin tonight. If she were to become unstable, we would need to proceed with a more urgent cardiac cath. Echo tomorrow. NPO at midnight for possible cardiac cath tomorrow.  Colon mass: We will ask the hospitalist service to admit her to facilitate workup for probable colon cancer. As above, if we elect to proceed with cardiac cath, she would most likely need to be on DAPT post stenting and this would prohibit surgical intervention on her colon mass.  Lauree Chandler 09/08/2020 5:19 PM

## 2020-09-09 ENCOUNTER — Encounter (HOSPITAL_COMMUNITY): Payer: Self-pay | Admitting: Internal Medicine

## 2020-09-09 ENCOUNTER — Encounter (HOSPITAL_COMMUNITY)
Admission: EM | Disposition: A | Discharge status: Home / Self Care | Payer: Self-pay | Source: Home / Self Care | Attending: Internal Medicine

## 2020-09-09 ENCOUNTER — Inpatient Hospital Stay (HOSPITAL_COMMUNITY): Payer: Medicare Other

## 2020-09-09 DIAGNOSIS — I2510 Atherosclerotic heart disease of native coronary artery without angina pectoris: Secondary | ICD-10-CM

## 2020-09-09 DIAGNOSIS — Z95828 Presence of other vascular implants and grafts: Secondary | ICD-10-CM

## 2020-09-09 DIAGNOSIS — I214 Non-ST elevation (NSTEMI) myocardial infarction: Principal | ICD-10-CM

## 2020-09-09 DIAGNOSIS — I10 Essential (primary) hypertension: Secondary | ICD-10-CM

## 2020-09-09 DIAGNOSIS — K6389 Other specified diseases of intestine: Secondary | ICD-10-CM

## 2020-09-09 DIAGNOSIS — E43 Unspecified severe protein-calorie malnutrition: Secondary | ICD-10-CM | POA: Insufficient documentation

## 2020-09-09 HISTORY — PX: LEFT HEART CATH AND CORONARY ANGIOGRAPHY: CATH118249

## 2020-09-09 LAB — CBC
HCT: 32.6 % — ABNORMAL LOW (ref 36.0–46.0)
Hemoglobin: 10.8 g/dL — ABNORMAL LOW (ref 12.0–15.0)
MCH: 26.5 pg (ref 26.0–34.0)
MCHC: 33.1 g/dL (ref 30.0–36.0)
MCV: 80.1 fL (ref 80.0–100.0)
Platelets: 502 10*3/uL — ABNORMAL HIGH (ref 150–400)
RBC: 4.07 MIL/uL (ref 3.87–5.11)
RDW: 15.6 % — ABNORMAL HIGH (ref 11.5–15.5)
WBC: 13.4 10*3/uL — ABNORMAL HIGH (ref 4.0–10.5)
nRBC: 0 % (ref 0.0–0.2)

## 2020-09-09 LAB — LIPID PANEL
Cholesterol: 122 mg/dL (ref 0–200)
HDL: 45 mg/dL (ref >40)
LDL Cholesterol: 60 mg/dL (ref 0–99)
Total CHOL/HDL Ratio: 2.7 RATIO
Triglycerides: 86 mg/dL (ref <150)
VLDL: 17 mg/dL (ref 0–40)

## 2020-09-09 LAB — COMPREHENSIVE METABOLIC PANEL
ALT: 15 U/L (ref 0–44)
AST: 78 U/L — ABNORMAL HIGH (ref 15–41)
Albumin: 2.7 g/dL — ABNORMAL LOW (ref 3.5–5.0)
Alkaline Phosphatase: 64 U/L (ref 38–126)
Anion gap: 9 (ref 5–15)
BUN: 7 mg/dL — ABNORMAL LOW (ref 8–23)
CO2: 24 mmol/L (ref 22–32)
Calcium: 8.7 mg/dL — ABNORMAL LOW (ref 8.9–10.3)
Chloride: 96 mmol/L — ABNORMAL LOW (ref 98–111)
Creatinine, Ser: 0.73 mg/dL (ref 0.44–1.00)
GFR, Estimated: >60 mL/min (ref >60)
Glucose, Bld: 106 mg/dL — ABNORMAL HIGH (ref 70–99)
Potassium: 4 mmol/L (ref 3.5–5.1)
Sodium: 129 mmol/L — ABNORMAL LOW (ref 135–145)
Total Bilirubin: 0.5 mg/dL (ref 0.3–1.2)
Total Protein: 5.9 g/dL — ABNORMAL LOW (ref 6.5–8.1)

## 2020-09-09 LAB — TSH: TSH: 1.431 u[IU]/mL (ref 0.350–4.500)

## 2020-09-09 LAB — ECHOCARDIOGRAM COMPLETE
Area-P 1/2: 3.31 cm2
Calc EF: 55.8 %
Height: 63 in
S' Lateral: 2.6 cm
Single Plane A2C EF: 63.5 %
Single Plane A4C EF: 43.3 %
Weight: 1670.4 oz

## 2020-09-09 LAB — HEPARIN LEVEL (UNFRACTIONATED): Heparin Unfractionated: 0.13 IU/mL — ABNORMAL LOW (ref 0.30–0.70)

## 2020-09-09 LAB — OSMOLALITY: Osmolality: 270 mOsm/kg — ABNORMAL LOW (ref 275–295)

## 2020-09-09 SURGERY — LEFT HEART CATH AND CORONARY ANGIOGRAPHY
Anesthesia: LOCAL

## 2020-09-09 MED ORDER — PERFLUTREN LIPID MICROSPHERE
1.0000 mL | INTRAVENOUS | Status: AC | PRN
  Administered 2020-09-09: 2 mL via INTRAVENOUS
  Filled 2020-09-09: qty 10

## 2020-09-09 MED ORDER — FENTANYL CITRATE (PF) 100 MCG/2ML IJ SOLN
INTRAMUSCULAR | Status: DC | PRN
  Administered 2020-09-09: 25 ug via INTRAVENOUS

## 2020-09-09 MED ORDER — HEPARIN (PORCINE) IN NACL 1000-0.9 UT/500ML-% IV SOLN
INTRAVENOUS | Status: AC
  Filled 2020-09-09: qty 1000

## 2020-09-09 MED ORDER — ENOXAPARIN SODIUM 40 MG/0.4ML IJ SOSY
40.0000 mg | PREFILLED_SYRINGE | INTRAMUSCULAR | Status: DC
  Administered 2020-09-11: 40 mg via SUBCUTANEOUS
  Filled 2020-09-09: qty 0.4

## 2020-09-09 MED ORDER — ENSURE ENLIVE PO LIQD
237.0000 mL | Freq: Three times a day (TID) | ORAL | Status: DC
  Administered 2020-09-11: 237 mL via ORAL

## 2020-09-09 MED ORDER — LIDOCAINE HCL (PF) 1 % IJ SOLN
INTRAMUSCULAR | Status: AC
  Filled 2020-09-09: qty 30

## 2020-09-09 MED ORDER — LIDOCAINE HCL (PF) 1 % IJ SOLN
INTRAMUSCULAR | Status: DC | PRN
  Administered 2020-09-09: 2 mL

## 2020-09-09 MED ORDER — SODIUM CHLORIDE 0.9% FLUSH
3.0000 mL | Freq: Two times a day (BID) | INTRAVENOUS | Status: DC
  Administered 2020-09-09 – 2020-09-11 (×3): 3 mL via INTRAVENOUS

## 2020-09-09 MED ORDER — SODIUM CHLORIDE 0.9% FLUSH: 3.0000 mL | INTRAVENOUS | Status: DC | PRN

## 2020-09-09 MED ORDER — HEPARIN BOLUS VIA INFUSION
1500.0000 [IU] | Freq: Once | INTRAVENOUS | Status: AC
  Administered 2020-09-09: 1500 [IU] via INTRAVENOUS
  Filled 2020-09-09: qty 1500

## 2020-09-09 MED ORDER — FENTANYL CITRATE (PF) 100 MCG/2ML IJ SOLN
INTRAMUSCULAR | Status: AC
  Filled 2020-09-09: qty 2

## 2020-09-09 MED ORDER — VERAPAMIL HCL 2.5 MG/ML IV SOLN
INTRAVENOUS | Status: DC | PRN
  Administered 2020-09-09: 10 mL via INTRA_ARTERIAL

## 2020-09-09 MED ORDER — HEPARIN SODIUM (PORCINE) 1000 UNIT/ML IJ SOLN
INTRAMUSCULAR | Status: DC | PRN
  Administered 2020-09-09: 3000 [IU] via INTRAVENOUS

## 2020-09-09 MED ORDER — IOHEXOL 350 MG/ML SOLN
INTRAVENOUS | Status: DC | PRN
  Administered 2020-09-09: 30 mL

## 2020-09-09 MED ORDER — VERAPAMIL HCL 2.5 MG/ML IV SOLN
INTRAVENOUS | Status: AC
  Filled 2020-09-09: qty 2

## 2020-09-09 MED ORDER — ADULT MULTIVITAMIN W/MINERALS CH
1.0000 | ORAL_TABLET | Freq: Every day | ORAL | Status: DC
  Administered 2020-09-11: 1 via ORAL
  Filled 2020-09-09: qty 1

## 2020-09-09 MED ORDER — HEPARIN (PORCINE) IN NACL 1000-0.9 UT/500ML-% IV SOLN
INTRAVENOUS | Status: DC | PRN
  Administered 2020-09-09 (×2): 500 mL

## 2020-09-09 MED ORDER — HEPARIN SODIUM (PORCINE) 1000 UNIT/ML IJ SOLN
INTRAMUSCULAR | Status: AC
  Filled 2020-09-09: qty 1

## 2020-09-09 MED ORDER — MIDAZOLAM HCL 2 MG/2ML IJ SOLN
INTRAMUSCULAR | Status: AC
  Filled 2020-09-09: qty 2

## 2020-09-09 MED ORDER — SODIUM CHLORIDE 0.9 % IV SOLN: INTRAVENOUS | Status: AC

## 2020-09-09 MED ORDER — SODIUM CHLORIDE 0.9 % IV SOLN: 250.0000 mL | INTRAVENOUS | Status: DC | PRN

## 2020-09-09 MED ORDER — MIDAZOLAM HCL 2 MG/2ML IJ SOLN
INTRAMUSCULAR | Status: DC | PRN
  Administered 2020-09-09: 1 mg via INTRAVENOUS

## 2020-09-09 MED ORDER — PEG 3350-KCL-NA BICARB-NACL 420 G PO SOLR
4000.0000 mL | Freq: Once | ORAL | Status: AC
  Administered 2020-09-09: 4000 mL via ORAL
  Filled 2020-09-09: qty 4000

## 2020-09-09 SURGICAL SUPPLY — 8 items
CATH INFINITI 5FR JK (CATHETERS) ×1 IMPLANT
DEVICE RAD TR BAND REGULAR (VASCULAR PRODUCTS) ×1 IMPLANT
GLIDESHEATH SLEND SS 6F .021 (SHEATH) ×1 IMPLANT
KIT HEART LEFT (KITS) ×2 IMPLANT
PACK CARDIAC CATHETERIZATION (CUSTOM PROCEDURE TRAY) ×2 IMPLANT
TRANSDUCER W/STOPCOCK (MISCELLANEOUS) ×2 IMPLANT
TUBING CIL FLEX 10 FLL-RA (TUBING) ×3 IMPLANT
WIRE HI TORQ VERSACORE-J 145CM (WIRE) ×1 IMPLANT

## 2020-09-09 NOTE — Interval H&P Note (Signed)
Cath Lab Visit (complete for each Cath Lab visit)  Clinical Evaluation Leading to the Procedure:   ACS: Yes.     Non-ACS:  n/a       History and Physical Interval Note:  09/09/2020 10:59 AM  Alejandra Ford  has presented today for surgery, with the diagnosis of nstemi.  The various methods of treatment have been discussed with the patient and family. After consideration of risks, benefits and other options for treatment, the patient has consented to  Procedure(s): LEFT HEART CATH AND CORONARY ANGIOGRAPHY (N/A) as a surgical intervention.  The patient's history has been reviewed, patient examined, no change in status, stable for surgery.  I have reviewed the patient's chart and labs.  Questions were answered to the patient's satisfaction.     Kathlyn Sacramento

## 2020-09-09 NOTE — Progress Notes (Addendum)
Progress Note  Patient Name: Alejandra Ford Date of Encounter: 09/09/2020  [facility] Cardiologist: None   Subjective   Had a few twinges of chest pain overnight, but nothing this morning.   Inpatient Medications    Scheduled Meds:  aspirin EC  81 mg Oral Daily   atorvastatin  80 mg Oral Daily   metoprolol tartrate  12.5 mg Oral BID   polyethylene glycol-electrolytes  4,000 mL Oral Once   sodium chloride flush  3 mL Intravenous Q12H   traZODone  50 mg Oral QHS   Continuous Infusions:  sodium chloride     sodium chloride 1 mL/kg/hr (09/09/20 0534)   heparin 750 Units/hr (09/09/20 0029)   PRN Meds: sodium chloride, acetaminophen **OR** acetaminophen, nitroGLYCERIN, ondansetron **OR** ondansetron (ZOFRAN) IV, perflutren lipid microspheres (DEFINITY) IV suspension, polyethylene glycol, sodium chloride flush   Vital Signs    Vitals:   09/08/20 2108 09/08/20 2120 09/09/20 0100 09/09/20 0430  BP:  (!) 162/78 (!) 144/70 126/78  Pulse:  90 80 80  Resp:  17 16 16   Temp:  99.7 F (37.6 C) 98.3 F (36.8 C) (!) 97.2 F (36.2 C)  TempSrc:  Oral Oral Oral  SpO2:  97% 99% 96%  Weight: 47.4 kg     Height:  5\' 3"  (1.6 m)      Intake/Output Summary (Last 24 hours) at 09/09/2020 0941 Last data filed at 09/09/2020 0500 Gross per 24 hour  Intake 191.72 ml  Output --  Net 191.72 ml   Last 3 Weights 09/08/2020 09/08/2020  Weight (lbs) 104 lb 6.4 oz 106 lb  Weight (kg) 47.356 kg 48.081 kg      Telemetry    SR - Personally Reviewed  ECG    SR with TWI in lateral leads - Personally Reviewed  Physical Exam   GEN: No acute distress.   Neck: No JVD Cardiac: RRR, no murmurs, rubs, or gallops.  Respiratory: Clear to auscultation bilaterally. GI: Soft, nontender, non-distended  MS: No edema; No deformity. Neuro:  Nonfocal  Psych: Normal affect   Labs    High Sensitivity Troponin:   Recent Labs  Lab 09/08/20 1310 09/08/20 1502 09/08/20 1814 09/08/20 2014 09/08/20 2248   TROPONINIHS 13,980* 13,592* 13,401* 11,720* 12,104*      Chemistry Recent Labs  Lab 09/08/20 1310 09/09/20 0726  NA 129* 129*  K 4.2 4.0  CL 93* 96*  CO2 27 24  GLUCOSE 111* 106*  BUN 6* 7*  CREATININE 0.68 0.73  CALCIUM 9.4 8.7*  PROT  --  5.9*  ALBUMIN  --  2.7*  AST  --  78*  ALT  --  15  ALKPHOS  --  64  BILITOT  --  0.5  GFRNONAA >60 >60  ANIONGAP 9 9     Hematology Recent Labs  Lab 09/08/20 1310 09/09/20 0726  WBC 12.1* 13.4*  RBC 4.38 4.07  HGB 11.6* 10.8*  HCT 36.2 32.6*  MCV 82.6 80.1  MCH 26.5 26.5  MCHC 32.0 33.1  RDW 15.7* 15.6*  PLT 557* 502*    BNPNo results for input(s): BNP, PROBNP in the last 168 hours.   DDimer No results for input(s): DDIMER in the last 168 hours.   Radiology    DG Chest 2 View  Result Date: 09/08/2020 CLINICAL DATA:  Chest pain. EXAM: CHEST - 2 VIEW COMPARISON:  CT 08/28/2020.  Chest x-ray 08/27/2020. FINDINGS: Mediastinum hilar structures normal. No focal infiltrate. Previously described pulmonary nodules best identified by  prior CT. No pleural effusion or pneumothorax. Heart size normal. No acute bony abnormality. IMPRESSION: No acute cardiopulmonary disease. Electronically Signed   By: Marcello Moores  Register   On: 09/08/2020 13:35    Cardiac Studies   Echo: pending  Patient Profile     75 y.o. female with PAD (follow by Dr. Donnetta Hutching), tobacco abuse, HLD, and recent CT abd with colon mass who was seen 09/08/2020 for the evaluation of chest pain and elevated troponin consistent with non-STEMI at request of Dr. Laverta Baltimore.   Assessment & Plan   NSTEMI: High-sensitivity troponin peaked at 13,980.  EKG shows T wave inversions in lateral leads.  She had a few brief episodes of chest pain overnight but none this morning. --Remains on IV heparin, aspirin, statin and beta-blocker --With concerns for malignancy complicated by NSTEMI plan will be for diagnostic cardiac catheterization today to allow for colonoscopy tomorrow.  If patient  needs to undergo PCI need to be on DAPT at minimum of 1 month.  Further recommendations pending ongoing work-up. --Echo pending  Colon Mass: Noted on CT scan with concern for malignancy of 7.5 cm of the lower ascending colon.  Seen by Dr. Alessandra Bevels today with plans for colonoscopy tomorrow.  PAD: Status post left to right fem/fem bypass grafting in 2009.  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 Donnetta Hutching  HLD: LDL 60 -- on statin  Anemia: Hgb 11.6>>10.8 -- follow CBC with need for IV heparin  For questions or updates, please contact [place] [facility] Please consult [email or web address] for contact info under        Signed, Reino Bellis, NP  09/09/2020, 9:41 AM     I have personally seen and examined this patient. I agree with the assessment and plan as outlined above.  Cardiac cath with occlusion of the mid Circumflex which likely happened 48 hours ago. The vessel fills from left to left collaterals. No stent is indicated. LV function overall preserved with wall motion abnormality in the Circumflex distribution.  We will continue ASA, statin and beta blocker. Ideally we would start Plavix given her presentation with ACS but will not start due to her need for GI workup.  She can proceed with her GI workup for the colon mass.   Lauree Chandler 09/09/2020 12:42 PM

## 2020-09-09 NOTE — Progress Notes (Signed)
ANTICOAGULATION CONSULT NOTE Pharmacy Consult for heparin Indication: chest pain/ACS  No Known Allergies  Patient Measurements: Height: 5\' 3"  (160 cm) Weight: 47.4 kg (104 lb 6.4 oz) IBW/kg (Calculated) : 52.4 Heparin Dosing Weight: TBW  Vital Signs: Temp: 99.7 F (37.6 C) (07/05 2120) Temp Source: Oral (07/05 2120) BP: 162/78 (07/05 2120) Pulse Rate: 90 (07/05 2120)  Labs: Recent Labs    09/08/20 1310 09/08/20 1502 09/08/20 1814 09/08/20 2014 09/08/20 2248  HGB 11.6*  --   --   --   --   HCT 36.2  --   --   --   --   PLT 557*  --   --   --   --   HEPARINUNFRC  --   --   --   --  <0.10*  CREATININE 0.68  --   --   --   --   TROPONINIHS 13,980*   < > 13,401* 11,720* 12,104*   < > = values in this interval not displayed.     Estimated Creatinine Clearance: 46.9 mL/min (by C-G formula based on SCr of 0.68 mg/dL).  Assessment: 75 y.o. female with chest pain for heparin   Goal of Therapy:  Heparin level 0.3-0.7 units/ml Monitor platelets by anticoagulation protocol: Yes   Plan:  Heparin 1500 units IV bolus, then increase heparin 750 units/hr Check heparin level in 8 hours.   Phillis Knack, PharmD, BCPS    09/09/2020 12:18 AM

## 2020-09-09 NOTE — Consult Note (Addendum)
Referring Provider:  [facility] Primary Care Physician:  Glenis Smoker, MD Primary Gastroenterologist:  Dr. Michail Sermon  Reason for Consultation: NSTEMI, possible colon cancer  HPI: Alejandra Ford is a 75 y.o. female with past medical history mentioned below admitted to the hospital yesterday with chest pain.  Was subsequently found to have NSTEMI.  GI is consulted for evaluation for colonoscopy prior to any cardiac intervention given recent CT scan finding concerning for colon cancer in the ascending colon.   Patient seen and examined at bedside.  Family at bedside.  She was diagnosed with COVID in October 2021 and had lost some weight but she regained it in a few months.  She again started losing weight this year.  She denies any other GI symptoms.  Denies abdominal pain, nausea or vomiting.  Denies diarrhea or constipation.  Denies change in bowel habits.  Denies blood in the stool or black stool.  Patient was seen by primary care physician on August 25, 2020 for evaluation of fatigue and weakness.  Blood work at that time showed anemia with hemoglobin of 10.3, mild hyponatremia and low vitamin B12.  Patient was also complaining of some weight loss.  CT chest abdomen pelvis with contrast for further evaluation on August 29, 2020 showed 7.5 cm long circumferential mass in the ascending colon just above the ileocecal valve concerning for malignancy.  It also showed scattered tiny lung nodules most likely from scarring.  She was scheduled for outpatient colonoscopy on September 11, 2020.  Patient with personal history of adenomatous polyps during initial colonoscopy in 2011.  Surveillance colonoscopy in February 2017 showed hyperplastic polyps and repeat was recommended in 5 years.   Past Medical History:  Diagnosis Date   HLD (hyperlipidemia)    PAD (peripheral artery disease) (HCC)    Tobacco abuse     History reviewed. No pertinent surgical history.  Prior to Admission medications   Medication Sig  Start Date End Date Taking? Authorizing Provider  acetaminophen (TYLENOL) 500 MG tablet Take 500 mg by mouth every 6 (six) hours as needed for moderate pain.   Yes [provider]  aspirin EC 81 MG tablet Take 81 mg by mouth daily. Swallow whole.   Yes [provider]  atorvastatin (LIPITOR) 40 MG tablet Take 40 mg by mouth every evening. 08/25/20  Yes [provider]  Cholecalciferol (VITAMIN D) 50 MCG (2000 UT) CAPS Take 1 capsule by mouth at bedtime.   Yes [provider]  ibuprofen (ADVIL) 200 MG tablet Take 200 mg by mouth every 6 (six) hours as needed.   Yes [provider]  polyethylene glycol (MIRALAX / GLYCOLAX) 17 g packet Take 17 g by mouth daily as needed for moderate constipation.   Yes [provider]  traZODone (DESYREL) 50 MG tablet Take 50 mg by mouth at bedtime. 09/02/20  Yes [provider]    Scheduled Meds:  aspirin EC  81 mg Oral Daily   atorvastatin  80 mg Oral Daily   metoprolol tartrate  12.5 mg Oral BID   sodium chloride flush  3 mL Intravenous Q12H   traZODone  50 mg Oral QHS   Continuous Infusions:  sodium chloride     sodium chloride 1 mL/kg/hr (09/09/20 0534)   heparin 750 Units/hr (09/09/20 0029)   PRN Meds:.sodium chloride, acetaminophen **OR** acetaminophen, nitroGLYCERIN, ondansetron **OR** ondansetron (ZOFRAN) IV, polyethylene glycol, sodium chloride flush  Allergies as of 09/08/2020   (No Known Allergies)    History reviewed.  No pertinent family history.  Social History   Socioeconomic History   Marital status: Married    Spouse name: Not on file   Number of children: Not on file   Years of education: Not on file   Highest education level: Not on file  Occupational History   Not on file  Tobacco Use   Smoking status: Former    Pack years: 0.00    Types: Cigarettes   Smokeless tobacco: Former  Substance and Sexual Activity   Alcohol use: Not on file   Drug use: Not on file    Sexual activity: Not on file  Other Topics Concern   Not on file  Social History Narrative   Not on file   Social Determinants of Health   Financial Resource Strain: Not on file  Food Insecurity: Not on file  Transportation Needs: Not on file  Physical Activity: Not on file  Stress: Not on file  Social Connections: Not on file  Intimate Partner Violence: Not on file    Review of Systems: All negative except as stated above in HPI.  Physical Exam: Vital signs: Vitals:   09/09/20 0100 09/09/20 0430  BP: (!) 144/70 126/78  Pulse: 80 80  Resp: 16 16  Temp: 98.3 F (36.8 C) (!) 97.2 F (36.2 C)  SpO2: 99% 96%   Last BM Date: 09/08/20 General:   Alert,  Well-developed, well-nourished, pleasant and cooperative in NAD Lungs:  Clear throughout to auscultation.   No wheezes, crackles, or rhonchi. No acute distress. Heart:  Regular rate and rhythm; no murmurs, clicks, rubs,  or gallops. Abdomen: Soft, nontender, nondistended, bowel sounds present.  No peritoneal signs Neuro.  Alert/oriented x3 Psych.  Mood and affect normal Rectal:  Deferred  GI:  Lab Results: Recent Labs    09/08/20 1310 09/09/20 0726  WBC 12.1* 13.4*  HGB 11.6* 10.8*  HCT 36.2 32.6*  PLT 557* 502*   BMET Recent Labs    09/08/20 1310  NA 129*  K 4.2  CL 93*  CO2 27  GLUCOSE 111*  BUN 6*  CREATININE 0.68  CALCIUM 9.4   LFT No results for input(s): PROT, ALBUMIN, AST, ALT, ALKPHOS, BILITOT, BILIDIR, IBILI in the last 72 hours. PT/INR No results for input(s): LABPROT, INR in the last 72 hours.   Studies/Results: DG Chest 2 View  Result Date: 09/08/2020 CLINICAL DATA:  Chest pain. EXAM: CHEST - 2 VIEW COMPARISON:  CT 08/28/2020.  Chest x-ray 08/27/2020. FINDINGS: Mediastinum hilar structures normal. No focal infiltrate. Previously described pulmonary nodules best identified by prior CT. No pleural effusion or pneumothorax. Heart size normal. No acute bony abnormality. IMPRESSION: No acute  cardiopulmonary disease. Electronically Signed   By: Marcello Moores  Register   On: 09/08/2020 13:35    Impression/Plan: -Abnormal CT scan concerning for ascending colon mass. - NSTEMI : Currently on heparin drip.  Recommendations -------------------------- -Tentative plan for colonoscopy tomorrow afternoon around 1 PM if okay with cardiology. Need to Hold heparin 4 hours prior to colonoscopy  -ok to  have clear liquids diet today after cardiology work-up -N.p.o. past midnight -Check CEA. -GI will follow  Risks (bleeding, infection, bowel perforation that could require surgery, sedation-related changes in cardiopulmonary systems), benefits (identification and possible treatment of source of symptoms, exclusion of certain causes of symptoms), and alternatives (watchful waiting, radiographic imaging studies, empiric medical treatment)  were explained to patient/family in detail and patient wishes to proceed.   Addendum : 9:23   Case discussed with Dr.  Mcalhany.  Plan for diagnostic cath today.  Okay for colonoscopy tomorrow.  If patient needs PCI, they will do it with one of the newer stent in which case antiplatelet therapy can be hold after 1 month for possible surgery for her colon cancer.    LOS: 1 day   Otis Brace  MD, [place] 09/09/2020, 8:25 AM  Contact #  [phone]

## 2020-09-09 NOTE — Progress Notes (Signed)
  Echocardiogram 2D Echocardiogram has been performed.  Michiel Cowboy 09/09/2020, 9:36 AM

## 2020-09-09 NOTE — Progress Notes (Addendum)
PROGRESS NOTE        PATIENT DETAILS Name: Alejandra Ford Age: 75 y.o. Sex: female Date of Birth: 10/05/1945 Admit Date: 09/08/2020 Admitting Physician Lavina Hamman, MD PXT:[id number], [name], MD  Brief Narrative: Patient is a 75 y.o. female who was recently diagnosed with a colon mass on CT abdomen-presenting with chest pain-found to have non-STEMI.  Significant events: 7/5>> admitted with non-STEMI 7/6>> LHC  Significant studies: 7/5>> CXR: No pneumonia 7/6>> Echo: EF 50-55%, hypokinesis mid apical/inferior/inferolateral and anterolateral wall.  Antimicrobial therapy: None  Microbiology data: 7/5>> COVID/influenza PCR: Negative  Procedures : 7/6>> LHC Dist Cx lesion is 100% stenosed. Prox RCA lesion is 50% stenosed.   Consults: Cardiology, GI  DVT Prophylaxis : enoxaparin (LOVENOX) injection 40 mg Start: 09/10/20 0800   Subjective: Lying comfortably in bed-no shortness of breath-some intermittent chest pain overnight.   Assessment/Plan: Non-STEMI: Presented with chest pain-currently chest pain-free-LHC done on 7/6-with occlusion of mid circumflex-Per cardiology-vessel fills from collaterals-no stent is indicated.  Okay to proceed with GI work-up.  Remains on aspirin/statin and beta-blocker-no longer on IV heparin.  Plavix not started as she needs a GI work-up.  Ascending colon mass: GI following-defer timing of colonoscopy to GI.  Mild normocytic anemia: Probably due to underlying malignancy-MCV almost borderline microcytic.  Watch for now.  Thrombocytosis: Mild-could be due to underlying malignancy-stable for follow-up.  Hyponatremia: Euvolemic on exam-unclear etiology-obtain serum/urine osmolality, TSH-she is asymptomatic-hyponatremia is mild and can be followed for now.  History of PAD-s/p right fem/fem bypass grafting in 2009, s/p aortobifemoral graft in 2010: On aspirin/statin-follows with vascular surgery.   Nutrition  Problem: Nutrition Problem: Severe Malnutrition Etiology: acute illness (colonic mass) Signs/Symptoms: severe fat depletion, severe muscle depletion, percent weight loss (13% weight loss within 3 months) Percent weight loss: 13 % Interventions: Ensure Enlive (each supplement provides 350kcal and 20 grams of protein), MVI   Diet: Diet Order             Diet clear liquid Room service appropriate? No; Fluid consistency: Thin  Diet effective now                    Code Status: Full code  Family Communication: Spouse at bedside  Disposition Plan: Status is: Inpatient  Remains inpatient appropriate because:Inpatient level of care appropriate due to severity of illness  Dispo: The patient is from: Home              Anticipated d/c is to: Home              Patient currently is not medically stable to d/c.   Difficult to place patient No    Barriers to Discharge: Non-STEMI-ascending colon mass-awaiting colonoscopy-see above  Antimicrobial agents: Anti-infectives (From admission, onward)    None        Time spent: 35 minutes-Greater than 50% of this time was spent in counseling, explanation of diagnosis, planning of further management, and coordination of care.  MEDICATIONS: Scheduled Meds:  aspirin EC  81 mg Oral Daily   atorvastatin  80 mg Oral Daily   [START ON 09/10/2020] enoxaparin (LOVENOX) injection  40 mg Subcutaneous Q24H   feeding supplement  237 mL Oral TID BM   metoprolol tartrate  12.5 mg Oral BID   multivitamin with minerals  1 tablet Oral Daily   polyethylene glycol-electrolytes  4,000  mL Oral Once   sodium chloride flush  3 mL Intravenous Q12H   sodium chloride flush  3 mL Intravenous Q12H   traZODone  50 mg Oral QHS   Continuous Infusions:  sodium chloride     sodium chloride     PRN Meds:.sodium chloride, acetaminophen **OR** acetaminophen, nitroGLYCERIN, ondansetron **OR** ondansetron (ZOFRAN) IV, polyethylene glycol, sodium chloride  flush   PHYSICAL EXAM: Vital signs: Vitals:   09/08/20 2120 09/09/20 0100 09/09/20 0430 09/09/20 1058  BP: (!) 162/78 (!) 144/70 126/78   Pulse: 90 80 80   Resp: 17 16 16    Temp: 99.7 F (37.6 C) 98.3 F (36.8 C) (!) 97.2 F (36.2 C)   TempSrc: Oral Oral Oral   SpO2: 97% 99% 96% 100%  Weight:      Height: 5\' 3"  (1.6 m)      Filed Weights   09/08/20 1502 09/08/20 2108  Weight: 48.1 kg 47.4 kg   Body mass index is 18.49 kg/m.   Gen Exam:Alert awake-not in any distress HEENT:atraumatic, normocephalic Chest: B/L clear to auscultation anteriorly CVS:S1S2 regular Abdomen:soft non tender, non distended Extremities:no edema Neurology: Non focal Skin: no rash  I have personally reviewed following labs and imaging studies  LABORATORY DATA: CBC: Recent Labs  Lab 09/08/20 1310 09/09/20 0726  WBC 12.1* 13.4*  HGB 11.6* 10.8*  HCT 36.2 32.6*  MCV 82.6 80.1  PLT 557* 502*    Basic Metabolic Panel: Recent Labs  Lab 09/08/20 1310 09/09/20 0726  NA 129* 129*  K 4.2 4.0  CL 93* 96*  CO2 27 24  GLUCOSE 111* 106*  BUN 6* 7*  CREATININE 0.68 0.73  CALCIUM 9.4 8.7*    GFR: Estimated Creatinine Clearance: 46.9 mL/min (by C-G formula based on SCr of 0.73 mg/dL).  Liver Function Tests: Recent Labs  Lab 09/09/20 0726  AST 78*  ALT 15  ALKPHOS 64  BILITOT 0.5  PROT 5.9*  ALBUMIN 2.7*   No results for input(s): LIPASE, AMYLASE in the last 168 hours. No results for input(s): AMMONIA in the last 168 hours.  Coagulation Profile: No results for input(s): INR, PROTIME in the last 168 hours.  Cardiac Enzymes: No results for input(s): CKTOTAL, CKMB, CKMBINDEX, TROPONINI in the last 168 hours.  BNP (last 3 results) No results for input(s): PROBNP in the last 8760 hours.  Lipid Profile: Recent Labs    09/09/20 0726  CHOL 122  HDL 45  LDLCALC 60  TRIG 86  CHOLHDL 2.7    Thyroid Function Tests: No results for input(s): TSH, T4TOTAL, FREET4, T3FREE,  THYROIDAB in the last 72 hours.  Anemia Panel: No results for input(s): VITAMINB12, FOLATE, FERRITIN, TIBC, IRON, RETICCTPCT in the last 72 hours.  Urine analysis:    Component Value Date/Time   COLORURINE YELLOW 07/28/2008 1316   APPEARANCEUR CLEAR 07/28/2008 1316   LABSPEC 1.005 07/28/2008 1316   PHURINE 6.0 07/28/2008 1316   GLUCOSEU NEGATIVE 07/28/2008 1316   HGBUR NEGATIVE 07/28/2008 1316   BILIRUBINUR NEGATIVE 07/28/2008 1316   KETONESUR NEGATIVE 07/28/2008 1316   PROTEINUR NEGATIVE 07/28/2008 1316   UROBILINOGEN 0.2 07/28/2008 1316   NITRITE NEGATIVE 07/28/2008 1316   LEUKOCYTESUR  07/28/2008 1316    NEGATIVE MICROSCOPIC NOT DONE ON URINES WITH NEGATIVE PROTEIN, BLOOD, LEUKOCYTES, NITRITE, OR GLUCOSE <1000 mg/dL.    Sepsis Labs: Lactic Acid, Venous No results found for: LATICACIDVEN  MICROBIOLOGY: Recent Results (from the past 240 hour(s))  Resp Panel by RT-PCR (Flu A&B, Covid) Nasopharyngeal Swab  Status: None   Collection Time: 09/08/20  3:10 PM   Specimen: Nasopharyngeal Swab; Nasopharyngeal(NP) swabs in vial transport medium  Result Value Ref Range Status   SARS Coronavirus 2 by RT PCR NEGATIVE NEGATIVE Final    Comment: (NOTE) SARS-CoV-2 target nucleic acids are NOT DETECTED.  The SARS-CoV-2 RNA is generally detectable in upper respiratory specimens during the acute phase of infection. The lowest concentration of SARS-CoV-2 viral copies this assay can detect is 138 copies/mL. A negative result does not preclude SARS-Cov-2 infection and should not be used as the sole basis for treatment or other patient management decisions. A negative result may occur with  improper specimen collection/handling, submission of specimen other than nasopharyngeal swab, presence of viral mutation(s) within the areas targeted by this assay, and inadequate number of viral copies(<138 copies/mL). A negative result must be combined with clinical observations, patient history, and  epidemiological information. The expected result is Negative.  Fact Sheet for Patients:  EntrepreneurPulse.com.au  Fact Sheet for Healthcare Providers:  IncredibleEmployment.be  This test is no t yet approved or cleared by the [place] FDA and  has been authorized for detection and/or diagnosis of SARS-CoV-2 by FDA under an Emergency Use Authorization (EUA). This EUA will remain  in effect (meaning this test can be used) for the duration of the COVID-19 declaration under Section 564(b)(1) of the Act, 21 U.S.C.section 360bbb-3(b)(1), unless the authorization is terminated  or revoked sooner.       Influenza A by PCR NEGATIVE NEGATIVE Final   Influenza B by PCR NEGATIVE NEGATIVE Final    Comment: (NOTE) The Xpert Xpress SARS-CoV-2/FLU/RSV plus assay is intended as an aid in the diagnosis of influenza from Nasopharyngeal swab specimens and should not be used as a sole basis for treatment. Nasal washings and aspirates are unacceptable for Xpert Xpress SARS-CoV-2/FLU/RSV testing.  Fact Sheet for Patients: EntrepreneurPulse.com.au  Fact Sheet for Healthcare Providers: IncredibleEmployment.be  This test is not yet approved or cleared by the [place] FDA and has been authorized for detection and/or diagnosis of SARS-CoV-2 by FDA under an Emergency Use Authorization (EUA). This EUA will remain in effect (meaning this test can be used) for the duration of the COVID-19 declaration under Section 564(b)(1) of the Act, 21 U.S.C. section 360bbb-3(b)(1), unless the authorization is terminated or revoked.  Performed at [facility], [place] [address] [place], [place] [address]     RADIOLOGY STUDIES/RESULTS: DG Chest 2 View  Result Date: 09/08/2020 CLINICAL DATA:  Chest pain. EXAM: CHEST - 2 VIEW COMPARISON:  CT 08/28/2020.  Chest x-ray 08/27/2020. FINDINGS: Mediastinum hilar structures normal. No focal  infiltrate. Previously described pulmonary nodules best identified by prior CT. No pleural effusion or pneumothorax. Heart size normal. No acute bony abnormality. IMPRESSION: No acute cardiopulmonary disease. Electronically Signed   By: Marcello Moores  Register   On: 09/08/2020 13:35   CARDIAC CATHETERIZATION  Result Date: 09/09/2020  Dist Cx lesion is 100% stenosed.  Prox RCA lesion is 50% stenosed.  1.  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  There is also moderate proximal RCA disease. 2.  Left ventricular angiography was not performed.  EF was low normal by echo with posterior wall hypokinesis.  Mildly elevated left ventricular end-diastolic pressure. Recommendations: This is a late presenting posterior myocardial infarction.  The occlusion involves distal left circumflex which has already collateralized.  The patient is chest pain-free and thus no indication for revascularization  at this point.  I recommend continuing medical therapy. I discontinued heparin drip. The patient can proceed with endoscopic GI procedures for diagnosis of colon mass at an overall low to moderate risk.   ECHOCARDIOGRAM COMPLETE  Result Date: 09/09/2020    ECHOCARDIOGRAM REPORT   Patient Name:   Alejandra Ford Date of Exam: 09/09/2020 Medical Rec #:  [id number]    Height:       63.0 in Accession #:    [id number]   Weight:       104.4 lb Date of Birth:  [birth year]    BSA:          1.467 m Patient Age:    27 years     BP:           126/78 mmHg Patient Gender: F            HR:           84 bpm. Exam Location:  Inpatient Procedure: 2D Echo, Cardiac Doppler, Color Doppler and Intracardiac            Opacification Agent Indications:    NSTEMI I21.4  History:        Patient has prior history of Echocardiogram examinations, most                 recent 06/20/2008. Risk Factors:Dyslipidemia. PAD.  Sonographer:    Vickie Epley RDCS Referring Phys: [id number]  [facility] BHAGAT IMPRESSIONS  1. Left ventricular ejection fraction, by estimation, is 50 to 55%. The left ventricle has low normal function. The left ventricle demonstrates regional wall motion abnormalities (see scoring diagram/findings for description). Left ventricular diastolic  parameters are indeterminate. Elevated left ventricular end-diastolic pressure. There is hypokinesis of the left ventricular, mid-apical inferior wall, inferolateral wall and anterolateral wall. There is akinesis of the left ventricular, apical inferior  wall.  2. Right ventricular systolic function is normal. The right ventricular size is normal. Tricuspid regurgitation signal is inadequate for assessing PA pressure.  3. The mitral valve is normal in structure. Mild mitral valve regurgitation. No evidence of mitral stenosis.  4. The aortic valve is tricuspid. There is mild calcification of the aortic valve. There is mild thickening of the aortic valve. Aortic valve regurgitation is not visualized. Mild aortic valve sclerosis is present, with no evidence of aortic valve stenosis.  5. The inferior vena cava is normal in size with greater than 50% respiratory variability, suggesting right atrial pressure of 3 mmHg. Comparison(s): Prior images unable to be directly viewed, comparison made by report only. Conclusion(s)/Recommendation(s): Wall motion abnormalities in the lateral and inferior wall. Results communicated to primary team, cardiology team also aware. FINDINGS  Left Ventricle: LV thrombus excluded by contrast. Left ventricular ejection fraction, by estimation, is 50 to 55%. The left ventricle has low normal function. The left ventricle demonstrates regional wall motion abnormalities. Definity contrast agent was given IV to delineate the left ventricular endocardial borders. The left ventricular internal cavity size was normal in size. There is no left ventricular hypertrophy. Left ventricular diastolic parameters are  indeterminate. Elevated left ventricular  end-diastolic pressure. Right Ventricle: The right ventricular size is normal. No increase in right ventricular wall thickness. Right ventricular systolic function is normal. Tricuspid regurgitation signal is inadequate for assessing PA pressure. Left Atrium: Left atrial size was normal in size. Right Atrium: Right atrial size was normal in size. Pericardium: There is no evidence of pericardial effusion. Mitral Valve: The mitral valve is normal in structure. Mild mitral  valve regurgitation. No evidence of mitral valve stenosis. Tricuspid Valve: The tricuspid valve is normal in structure. Tricuspid valve regurgitation is trivial. No evidence of tricuspid stenosis. Aortic Valve: The aortic valve is tricuspid. There is mild calcification of the aortic valve. There is mild thickening of the aortic valve. Aortic valve regurgitation is not visualized. Mild aortic valve sclerosis is present, with no evidence of aortic valve stenosis. Pulmonic Valve: The pulmonic valve was not well visualized. Pulmonic valve regurgitation is not visualized. No evidence of pulmonic stenosis. Aorta: The aortic root, ascending aorta, aortic arch and descending aorta are all structurally normal, with no evidence of dilitation or obstruction. Venous: The inferior vena cava is normal in size with greater than 50% respiratory variability, suggesting right atrial pressure of 3 mmHg. IAS/Shunts: The atrial septum is grossly normal.  LEFT VENTRICLE PLAX 2D LVIDd:         3.60 cm     Diastology LVIDs:         2.60 cm     LV e' medial:    4.33 cm/s LV PW:         0.80 cm     LV E/e' medial:  20.2 LV IVS:        0.80 cm     LV e' lateral:   6.25 cm/s LVOT diam:     1.80 cm     LV E/e' lateral: 14.0 LV SV:         47 LV SV Index:   32 LVOT Area:     2.54 cm  LV Volumes (MOD) LV vol d, MOD A2C: 98.6 ml LV vol d, MOD A4C: 67.2 ml LV vol s, MOD A2C: 36.0 ml LV vol s, MOD A4C: 38.1 ml LV SV MOD A2C:     62.6 ml LV  SV MOD A4C:     67.2 ml LV SV MOD BP:      46.7 ml RIGHT VENTRICLE TAPSE (M-mode): 1.6 cm LEFT ATRIUM             Index       RIGHT ATRIUM          Index LA diam:        3.10 cm 2.11 cm/m  RA Area:     6.12 cm LA Vol (A2C):   20.0 ml 13.63 ml/m RA Volume:   10.50 ml 7.16 ml/m LA Vol (A4C):   22.5 ml 15.34 ml/m LA Biplane Vol: 22.1 ml 15.07 ml/m  AORTIC VALVE LVOT Vmax:   79.90 cm/s LVOT Vmean:  62.000 cm/s LVOT VTI:    0.185 m  AORTA Ao Root diam: 2.50 cm MITRAL VALVE MV Area (PHT): 3.31 cm     SHUNTS MV Decel Time: 229 msec     Systemic VTI:  0.18 m MV E velocity: 87.40 cm/s   Systemic Diam: 1.80 cm MV A velocity: 113.00 cm/s MV E/A ratio:  0.77 Buford Dresser MD Electronically signed by Buford Dresser MD Signature Date/Time: 09/09/2020/12:07:01 PM    Final      LOS: 1 day   Oren Binet, MD  [facility]    To contact the attending provider between 7A-7P or the covering provider during after hours 7P-7A, please log into the web site [email or web address] and access using universal [place] password for that web site. If you do not have the password, please call the hospital operator.  09/09/2020, 12:48 PM

## 2020-09-09 NOTE — Progress Notes (Signed)
Initial Nutrition Assessment  DOCUMENTATION CODES:   Severe malnutrition in context of acute illness/injury, Underweight  INTERVENTION:   Ensure Enlive po TID, each supplement provides 350 kcal and 20 grams of protein MVI with minerals daily  NUTRITION DIAGNOSIS:   Severe Malnutrition related to acute illness (colonic mass) as evidenced by severe fat depletion, severe muscle depletion, percent weight loss (13% weight loss within 3 months).  GOAL:   Patient will meet greater than or equal to 90% of their needs  MONITOR:   PO intake, Supplement acceptance, Labs  REASON FOR ASSESSMENT:   Malnutrition Screening Tool    ASSESSMENT:   75 yo female admitted with chest pain radiating to left arm. PMH includes PAD, former smoker, HLD, COVID-24 December 2019.  Per H&P, patient has lost 16 pounds in the past 2 months. Outpatient CT chest, abd, pelvis showed colonic mass. Patient was scheduled for a colonoscopy on 09/11/20.  Spoke with patient briefly prior to her being taken to the cath lab. Her usual weight is 120 lbs, now down to 104 lbs. 13% weight loss within 3 months is severe. Per husband, patient has been eating regularly, but has still been losing weight.   Labs reviewed. Na 129 Medications reviewed.  Patient meets criteria for severe malnutrition, given severe depletion of muscle and subcutaneous fat mass with 13% weight loss within 3 months.  Husband requested information on ways to increase calorie and protein intake. RD provided "High Protein, High Calorie Nutrition Therapy" and " High Protein, High Calorie Recipes" handouts from the Academy of [employer] and Dietetics.   NUTRITION - FOCUSED PHYSICAL EXAM:  Flowsheet Row Most Recent Value  Orbital Region Severe depletion  Upper Arm Region Moderate depletion  Thoracic and Lumbar Region Severe depletion  Buccal Region Moderate depletion  Temple Region Severe depletion  Clavicle Bone Region Severe depletion  Clavicle and  Acromion Bone Region Severe depletion  Scapular Bone Region Severe depletion  Dorsal Hand Severe depletion  Patellar Region Moderate depletion  Anterior Thigh Region Moderate depletion  Posterior Calf Region Mild depletion  Edema (RD Assessment) None  Hair Reviewed  Eyes Reviewed  Mouth Reviewed  Skin Reviewed  Nails Reviewed       Diet Order:   Diet Order             Diet NPO time specified  Diet effective midnight           Diet NPO time specified Except for: Sips with Meds  Diet effective midnight                   EDUCATION NEEDS:   Education needs have been addressed (provided husband with handouts for increasing protein and calorie intake)  Skin:  Skin Assessment: Reviewed RN Assessment  Last BM:  7/5  Height:   Ht Readings from Last 1 Encounters:  09/08/20 5\' 3"  (1.6 m)    Weight:   Wt Readings from Last 1 Encounters:  09/08/20 47.4 kg    Ideal Body Weight:  52.3 kg  BMI:  Body mass index is 18.49 kg/m.  Estimated Nutritional Needs:   Kcal:  1600-1800  Protein:  75-85 gm  Fluid:  >/= 1.5 L    Lucas Mallow, RD, LDN, CNSC Please refer to Amion for contact information.

## 2020-09-09 NOTE — Progress Notes (Signed)
ANTICOAGULATION CONSULT NOTE  Pharmacy Consult for heparin Indication: chest pain/ACS  No Known Allergies  Patient Measurements: Height: 5\' 3"  (160 cm) Weight: 47.4 kg (104 lb 6.4 oz) IBW/kg (Calculated) : 52.4 Heparin Dosing Weight: TBW  Vital Signs: Temp: 97.2 F (36.2 C) (07/06 0430) Temp Source: Oral (07/06 0430) BP: 126/78 (07/06 0430) Pulse Rate: 80 (07/06 0430)  Labs: Recent Labs    09/08/20 1310 09/08/20 1502 09/08/20 1814 09/08/20 2014 09/08/20 2248 09/09/20 0726  HGB 11.6*  --   --   --   --  10.8*  HCT 36.2  --   --   --   --  32.6*  PLT 557*  --   --   --   --  502*  HEPARINUNFRC  --   --   --   --  <0.10* 0.13*  CREATININE 0.68  --   --   --   --  0.73  TROPONINIHS 13,980*   < > 13,401* 11,720* 12,104*  --    < > = values in this interval not displayed.     Estimated Creatinine Clearance: 46.9 mL/min (by C-G formula based on SCr of 0.73 mg/dL).   Medical History: Past Medical History:  Diagnosis Date   HLD (hyperlipidemia)    PAD (peripheral artery disease) (HCC)    Tobacco abuse     Assessment: 37 YOF presenting with CP, elevated troponin, she is not on anticoagulation PTA.   Heparin level this morning came back subtherapeutic at 0.13, on 750 units/hr. Hgb 10.8, plt 502. No s/sx of bleeding (oozing at IV site) or infusion issues.   Goal of Therapy:  Heparin level 0.3-0.7 units/ml Monitor platelets by anticoagulation protocol: Yes   Plan:  Plan for cardiac cath at 10am >> f/u after cath Monitor daily HL, CBC, and for s/sx of bleeding  Antonietta Jewel, PharmD, [place] Pharmacist  Phone: [phone] 09/09/2020 9:29 AM  Please check AMION for all [place] phone numbers After 10:00 PM, call [place] [phone]

## 2020-09-10 ENCOUNTER — Inpatient Hospital Stay (HOSPITAL_COMMUNITY): Payer: Medicare Other | Admitting: Certified Registered Nurse Anesthetist

## 2020-09-10 ENCOUNTER — Encounter (HOSPITAL_COMMUNITY): Payer: Self-pay | Admitting: Internal Medicine

## 2020-09-10 ENCOUNTER — Encounter (HOSPITAL_COMMUNITY)
Admission: EM | Disposition: A | Discharge status: Home / Self Care | Payer: Self-pay | Source: Home / Self Care | Attending: Internal Medicine

## 2020-09-10 HISTORY — PX: SUBMUCOSAL TATTOO INJECTION: SHX6856

## 2020-09-10 HISTORY — PX: BIOPSY: SHX5522

## 2020-09-10 HISTORY — PX: COLONOSCOPY: SHX5424

## 2020-09-10 LAB — BASIC METABOLIC PANEL
Anion gap: 7 (ref 5–15)
BUN: 9 mg/dL (ref 8–23)
CO2: 23 mmol/L (ref 22–32)
Calcium: 8.6 mg/dL — ABNORMAL LOW (ref 8.9–10.3)
Chloride: 102 mmol/L (ref 98–111)
Creatinine, Ser: 0.68 mg/dL (ref 0.44–1.00)
GFR, Estimated: >60 mL/min (ref >60)
Glucose, Bld: 96 mg/dL (ref 70–99)
Potassium: 3.9 mmol/L (ref 3.5–5.1)
Sodium: 132 mmol/L — ABNORMAL LOW (ref 135–145)

## 2020-09-10 LAB — CBC
HCT: 28.3 % — ABNORMAL LOW (ref 36.0–46.0)
Hemoglobin: 9.4 g/dL — ABNORMAL LOW (ref 12.0–15.0)
MCH: 26.9 pg (ref 26.0–34.0)
MCHC: 33.2 g/dL (ref 30.0–36.0)
MCV: 81.1 fL (ref 80.0–100.0)
Platelets: 422 10*3/uL — ABNORMAL HIGH (ref 150–400)
RBC: 3.49 MIL/uL — ABNORMAL LOW (ref 3.87–5.11)
RDW: 15.7 % — ABNORMAL HIGH (ref 11.5–15.5)
WBC: 10.3 10*3/uL (ref 4.0–10.5)
nRBC: 0 % (ref 0.0–0.2)

## 2020-09-10 LAB — CEA: CEA: 2.4 ng/mL (ref 0.0–4.7)

## 2020-09-10 SURGERY — COLONOSCOPY
Anesthesia: Monitor Anesthesia Care

## 2020-09-10 MED ORDER — PROPOFOL 500 MG/50ML IV EMUL
INTRAVENOUS | Status: DC | PRN
  Administered 2020-09-10: 100 ug/kg/min via INTRAVENOUS

## 2020-09-10 MED ORDER — PROPOFOL 10 MG/ML IV BOLUS
INTRAVENOUS | Status: DC | PRN
  Administered 2020-09-10: 20 mg via INTRAVENOUS
  Administered 2020-09-10: 40 mg via INTRAVENOUS
  Administered 2020-09-10: 20 mg via INTRAVENOUS

## 2020-09-10 MED ORDER — PHENYLEPHRINE 40 MCG/ML (10ML) SYRINGE FOR IV PUSH (FOR BLOOD PRESSURE SUPPORT)
PREFILLED_SYRINGE | INTRAVENOUS | Status: DC | PRN
  Administered 2020-09-10: 120 ug via INTRAVENOUS

## 2020-09-10 MED ORDER — SODIUM CHLORIDE 0.9 % IV SOLN: INTRAVENOUS | Status: DC

## 2020-09-10 MED ORDER — LIDOCAINE 2% (20 MG/ML) 5 ML SYRINGE
INTRAMUSCULAR | Status: DC | PRN
  Administered 2020-09-10: 60 mg via INTRAVENOUS

## 2020-09-10 MED ORDER — LACTATED RINGERS IV SOLN: INTRAVENOUS | Status: DC

## 2020-09-10 NOTE — Transfer of Care (Signed)
Immediate Anesthesia Transfer of Care Note  Patient: Alejandra Ford  Procedure(s) Performed: COLONOSCOPY BIOPSY SUBMUCOSAL TATTOO INJECTION  Patient Location: Endoscopy Unit  Anesthesia Type:MAC  Level of Consciousness: drowsy  Airway & Oxygen Therapy: Patient Spontanous Breathing and Patient connected to face mask oxygen  Post-op Assessment: Report given to RN and Post -op Vital signs reviewed and stable  Post vital signs: Reviewed and stable  Last Vitals:  Vitals Value Taken Time  BP 128/52 09/10/20 1123  Temp    Pulse 73 09/10/20 1124  Resp 18 09/10/20 1124  SpO2 100 % 09/10/20 1124  Vitals shown include unvalidated device data.  Last Pain:  Vitals:   09/10/20 0919  TempSrc: Temporal  PainSc: 0-No pain      Patients Stated Pain Goal: 0 (83/77/93 9688)  Complications: No notable events documented.

## 2020-09-10 NOTE — Progress Notes (Signed)
PROGRESS NOTE        PATIENT DETAILS Name: Alejandra Ford Age: 75 y.o. Sex: female Date of Birth: 02-15-1946 Admit Date: 09/08/2020 Admitting Physician Lavina Hamman, MD [id number], [name], MD  Brief Narrative: Patient is a 75 y.o. female who was recently diagnosed with a colon mass on CT abdomen-presenting with chest pain-found to have non-STEMI.  Significant events: 7/5>> admitted with non-STEMI 7/6>> LHC  Significant studies: 6/24>> CT chest: Scattered tiny lung nodules-most likely scarring. 6/24>> CT abdomen/pelvis: 7.5 cm mass in the ascending colon-no evidence of metastatic disease in abdomen. 7/5>> CXR: No pneumonia 7/6>> Echo: EF 50-55%, hypokinesis mid apical/inferior/inferolateral and anterolateral wall.  Antimicrobial therapy: None  Microbiology data: 7/5>> COVID/influenza PCR: Negative  Procedures : 7/6>> LHC Dist Cx lesion is 100% stenosed. Prox RCA lesion is 50% stenosed.  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  Assessment/Plan: Non-STEMI: Presented with chest pain-currently chest pain-free-LHC done on 7/6-with occlusion of mid circumflex-Per cardiology-vessel fills from collaterals-no stent is indicated.  Per cardiology-okay to proceed with further work-up.  Remains on aspirin/statin and beta-blocker.  Not on Plavix as patient may need further GI work-up/intervention.   Ascending colon mass: Confirmed on colonoscopy on 7/7-await biopsy-await CCS consult.    Mild normocytic anemia: Probably due to underlying malignancy-MCV almost borderline microcytic.  Watch for now.  Thrombocytosis: Mild-could be due to underlying malignancy-stable for follow-up.  Hyponatremia: Euvolemic on exam-improving with just supportive care.  History of  PAD-s/p right fem/fem bypass grafting in 2009, s/p aortobifemoral graft in 2010: On aspirin/statin-follows with vascular surgery.  Nutrition Problem: Nutrition Problem: Severe Malnutrition Etiology: acute illness (colonic mass) Signs/Symptoms: severe fat depletion, severe muscle depletion, percent weight loss (13% weight loss within 3 months) Percent weight loss: 13 % Interventions: Ensure Enlive (each supplement provides 350kcal and 20 grams of protein), MVI   Diet: Diet Order             Diet clear liquid Room service appropriate? Yes; Fluid consistency: Thin  Diet effective now                    Code Status: Full code  Family Communication: Spouse at bedside on 7/7  Disposition Plan: Status is: Inpatient  Remains inpatient appropriate because:Inpatient level of care appropriate due to severity of illness  Dispo: The patient is from: Home              Anticipated d/c is to: Home              Patient currently is not medically stable to d/c.   Difficult to place patient No    Barriers to Discharge: Non-STEMI-ascending colon mass-await CCS input regarding timing for possible colectomy.  Antimicrobial agents: Anti-infectives (From admission, onward)    None        Time spent: 35 minutes-Greater than 50% of this time was spent in counseling, explanation of diagnosis, planning of further management, and coordination of care.  MEDICATIONS: Scheduled Meds:  aspirin EC  81 mg Oral Daily   atorvastatin  80 mg Oral Daily   enoxaparin (LOVENOX) injection  40 mg Subcutaneous Q24H   feeding supplement  237 mL Oral TID BM  metoprolol tartrate  12.5 mg Oral BID   multivitamin with minerals  1 tablet Oral Daily   sodium chloride flush  3 mL Intravenous Q12H   sodium chloride flush  3 mL Intravenous Q12H   traZODone  50 mg Oral QHS   Continuous Infusions:  sodium chloride     lactated ringers Stopped (09/10/20 1144)   PRN Meds:.sodium chloride, acetaminophen  **OR** acetaminophen, nitroGLYCERIN, ondansetron **OR** ondansetron (ZOFRAN) IV, polyethylene glycol, sodium chloride flush   PHYSICAL EXAM: Vital signs: Vitals:   09/10/20 0919 09/10/20 1123 09/10/20 1135 09/10/20 1153  BP: (!) 151/71 (!) 128/52 130/62 139/76  Pulse: 89 72 82 87  Resp: 14 18 19 18   Temp: (!) 97 F (36.1 C) 98.6 F (37 C)  98.7 F (37.1 C)  TempSrc: Temporal Oral  Oral  SpO2:  100% 98% 97%  Weight: 47.4 kg     Height: 5\' 3"  (1.6 m)      Filed Weights   09/08/20 1502 09/08/20 2108 09/10/20 0919  Weight: 48.1 kg 47.4 kg 47.4 kg   Body mass index is 18.51 kg/m.   Gen Exam:Alert awake-not in any distress HEENT:atraumatic, normocephalic Chest: B/L clear to auscultation anteriorly CVS:S1S2 regular Abdomen:soft non tender, non distended Extremities:no edema Neurology: Non focal Skin: no rash   I have personally reviewed following labs and imaging studies  LABORATORY DATA: CBC: Recent Labs  Lab 09/08/20 1310 09/09/20 0726 09/10/20 0527  WBC 12.1* 13.4* 10.3  HGB 11.6* 10.8* 9.4*  HCT 36.2 32.6* 28.3*  MCV 82.6 80.1 81.1  PLT 557* 502* 422*     Basic Metabolic Panel: Recent Labs  Lab 09/08/20 1310 09/09/20 0726 09/10/20 0527  NA 129* 129* 132*  K 4.2 4.0 3.9  CL 93* 96* 102  CO2 27 24 23   GLUCOSE 111* 106* 96  BUN 6* 7* 9  CREATININE 0.68 0.73 0.68  CALCIUM 9.4 8.7* 8.6*     GFR: Estimated Creatinine Clearance: 46.9 mL/min (by C-G formula based on SCr of 0.68 mg/dL).  Liver Function Tests: Recent Labs  Lab 09/09/20 0726  AST 78*  ALT 15  ALKPHOS 64  BILITOT 0.5  PROT 5.9*  ALBUMIN 2.7*    No results for input(s): LIPASE, AMYLASE in the last 168 hours. No results for input(s): AMMONIA in the last 168 hours.  Coagulation Profile: No results for input(s): INR, PROTIME in the last 168 hours.  Cardiac Enzymes: No results for input(s): CKTOTAL, CKMB, CKMBINDEX, TROPONINI in the last 168 hours.  BNP (last 3 results) No  results for input(s): PROBNP in the last 8760 hours.  Lipid Profile: Recent Labs    09/09/20 0726  CHOL 122  HDL 45  LDLCALC 60  TRIG 86  CHOLHDL 2.7     Thyroid Function Tests: Recent Labs    09/09/20 1310  TSH 1.431    Anemia Panel: No results for input(s): VITAMINB12, FOLATE, FERRITIN, TIBC, IRON, RETICCTPCT in the last 72 hours.  Urine analysis:    Component Value Date/Time   COLORURINE YELLOW 07/28/2008 1316   APPEARANCEUR CLEAR 07/28/2008 1316   LABSPEC 1.005 07/28/2008 1316   PHURINE 6.0 07/28/2008 1316   GLUCOSEU NEGATIVE 07/28/2008 1316   HGBUR NEGATIVE 07/28/2008 1316   BILIRUBINUR NEGATIVE 07/28/2008 1316   KETONESUR NEGATIVE 07/28/2008 1316   PROTEINUR NEGATIVE 07/28/2008 1316   UROBILINOGEN 0.2 07/28/2008 1316   NITRITE NEGATIVE 07/28/2008 1316   LEUKOCYTESUR  07/28/2008 1316    NEGATIVE MICROSCOPIC NOT DONE ON URINES WITH NEGATIVE PROTEIN,  BLOOD, LEUKOCYTES, NITRITE, OR GLUCOSE <1000 mg/dL.    Sepsis Labs: Lactic Acid, Venous No results found for: LATICACIDVEN  MICROBIOLOGY: Recent Results (from the past 240 hour(s))  Resp Panel by RT-PCR (Flu A&B, Covid) Nasopharyngeal Swab     Status: None   Collection Time: 09/08/20  3:10 PM   Specimen: Nasopharyngeal Swab; Nasopharyngeal(NP) swabs in vial transport medium  Result Value Ref Range Status   SARS Coronavirus 2 by RT PCR NEGATIVE NEGATIVE Final    Comment: (NOTE) SARS-CoV-2 target nucleic acids are NOT DETECTED.  The SARS-CoV-2 RNA is generally detectable in upper respiratory specimens during the acute phase of infection. The lowest concentration of SARS-CoV-2 viral copies this assay can detect is 138 copies/mL. A negative result does not preclude SARS-Cov-2 infection and should not be used as the sole basis for treatment or other patient management decisions. A negative result may occur with  improper specimen collection/handling, submission of specimen other than nasopharyngeal swab,  presence of viral mutation(s) within the areas targeted by this assay, and inadequate number of viral copies(<138 copies/mL). A negative result must be combined with clinical observations, patient history, and epidemiological information. The expected result is Negative.  Fact Sheet for Patients:  EntrepreneurPulse.com.au  Fact Sheet for Healthcare Providers:  IncredibleEmployment.be  This test is no t yet approved or cleared by the [place] FDA and  has been authorized for detection and/or diagnosis of SARS-CoV-2 by FDA under an Emergency Use Authorization (EUA). This EUA will remain  in effect (meaning this test can be used) for the duration of the COVID-19 declaration under Section 564(b)(1) of the Act, 21 U.S.C.section 360bbb-3(b)(1), unless the authorization is terminated  or revoked sooner.       Influenza A by PCR NEGATIVE NEGATIVE Final   Influenza B by PCR NEGATIVE NEGATIVE Final    Comment: (NOTE) The Xpert Xpress SARS-CoV-2/FLU/RSV plus assay is intended as an aid in the diagnosis of influenza from Nasopharyngeal swab specimens and should not be used as a sole basis for treatment. Nasal washings and aspirates are unacceptable for Xpert Xpress SARS-CoV-2/FLU/RSV testing.  Fact Sheet for Patients: EntrepreneurPulse.com.au  Fact Sheet for Healthcare Providers: IncredibleEmployment.be  This test is not yet approved or cleared by the [place] FDA and has been authorized for detection and/or diagnosis of SARS-CoV-2 by FDA under an Emergency Use Authorization (EUA). This EUA will remain in effect (meaning this test can be used) for the duration of the COVID-19 declaration under Section 564(b)(1) of the Act, 21 U.S.C. section 360bbb-3(b)(1), unless the authorization is terminated or revoked.  Performed at [facility], [place] [address] [place], [place] [address]     RADIOLOGY  STUDIES/RESULTS: DG Chest 2 View  Result Date: 09/08/2020 CLINICAL DATA:  Chest pain. EXAM: CHEST - 2 VIEW COMPARISON:  CT 08/28/2020.  Chest x-ray 08/27/2020. FINDINGS: Mediastinum hilar structures normal. No focal infiltrate. Previously described pulmonary nodules best identified by prior CT. No pleural effusion or pneumothorax. Heart size normal. No acute bony abnormality. IMPRESSION: No acute cardiopulmonary disease. Electronically Signed   By: Marcello Moores  Register   On: 09/08/2020 13:35   CARDIAC CATHETERIZATION  Result Date: 09/09/2020  Dist Cx lesion is 100% stenosed.  Prox RCA lesion is 50% stenosed.  1.  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  There is also moderate proximal RCA disease. 2.  Left ventricular angiography was not performed.  EF was low normal by echo  with posterior wall hypokinesis.  Mildly elevated left ventricular end-diastolic pressure. Recommendations: This is a late presenting posterior myocardial infarction.  The occlusion involves distal left circumflex which has already collateralized.  The patient is chest pain-free and thus no indication for revascularization at this point.  I recommend continuing medical therapy. I discontinued heparin drip. The patient can proceed with endoscopic GI procedures for diagnosis of colon mass at an overall low to moderate risk.   ECHOCARDIOGRAM COMPLETE  Result Date: 09/09/2020    ECHOCARDIOGRAM REPORT   Patient Name:   Alejandra Ford Date of Exam: 09/09/2020 Medical Rec #:  [id number]    Height:       63.0 in Accession #:    [id number]   Weight:       104.4 lb Date of Birth:  [birth year]    BSA:          1.467 m Patient Age:    78 years     BP:           126/78 mmHg Patient Gender: F            HR:           84 bpm. Exam Location:  Inpatient Procedure: 2D Echo, Cardiac Doppler, Color Doppler and Intracardiac            Opacification Agent Indications:    NSTEMI  I21.4  History:        Patient has prior history of Echocardiogram examinations, most                 recent 06/20/2008. Risk Factors:Dyslipidemia. PAD.  Sonographer:    Vickie Epley RDCS Referring Phys: [id number] [facility] BHAGAT IMPRESSIONS  1. Left ventricular ejection fraction, by estimation, is 50 to 55%. The left ventricle has low normal function. The left ventricle demonstrates regional wall motion abnormalities (see scoring diagram/findings for description). Left ventricular diastolic  parameters are indeterminate. Elevated left ventricular end-diastolic pressure. There is hypokinesis of the left ventricular, mid-apical inferior wall, inferolateral wall and anterolateral wall. There is akinesis of the left ventricular, apical inferior  wall.  2. Right ventricular systolic function is normal. The right ventricular size is normal. Tricuspid regurgitation signal is inadequate for assessing PA pressure.  3. The mitral valve is normal in structure. Mild mitral valve regurgitation. No evidence of mitral stenosis.  4. The aortic valve is tricuspid. There is mild calcification of the aortic valve. There is mild thickening of the aortic valve. Aortic valve regurgitation is not visualized. Mild aortic valve sclerosis is present, with no evidence of aortic valve stenosis.  5. The inferior vena cava is normal in size with greater than 50% respiratory variability, suggesting right atrial pressure of 3 mmHg. Comparison(s): Prior images unable to be directly viewed, comparison made by report only. Conclusion(s)/Recommendation(s): Wall motion abnormalities in the lateral and inferior wall. Results communicated to primary team, cardiology team also aware. FINDINGS  Left Ventricle: LV thrombus excluded by contrast. Left ventricular ejection fraction, by estimation, is 50 to 55%. The left ventricle has low normal function. The left ventricle demonstrates regional wall motion abnormalities. Definity contrast agent was given IV to  delineate the left ventricular endocardial borders. The left ventricular internal cavity size was normal in size. There is no left ventricular hypertrophy. Left ventricular diastolic parameters are indeterminate. Elevated left ventricular  end-diastolic pressure. Right Ventricle: The right ventricular size is normal. No increase in right ventricular wall thickness. Right ventricular systolic function is normal. Tricuspid regurgitation signal  is inadequate for assessing PA pressure. Left Atrium: Left atrial size was normal in size. Right Atrium: Right atrial size was normal in size. Pericardium: There is no evidence of pericardial effusion. Mitral Valve: The mitral valve is normal in structure. Mild mitral valve regurgitation. No evidence of mitral valve stenosis. Tricuspid Valve: The tricuspid valve is normal in structure. Tricuspid valve regurgitation is trivial. No evidence of tricuspid stenosis. Aortic Valve: The aortic valve is tricuspid. There is mild calcification of the aortic valve. There is mild thickening of the aortic valve. Aortic valve regurgitation is not visualized. Mild aortic valve sclerosis is present, with no evidence of aortic valve stenosis. Pulmonic Valve: The pulmonic valve was not well visualized. Pulmonic valve regurgitation is not visualized. No evidence of pulmonic stenosis. Aorta: The aortic root, ascending aorta, aortic arch and descending aorta are all structurally normal, with no evidence of dilitation or obstruction. Venous: The inferior vena cava is normal in size with greater than 50% respiratory variability, suggesting right atrial pressure of 3 mmHg. IAS/Shunts: The atrial septum is grossly normal.  LEFT VENTRICLE PLAX 2D LVIDd:         3.60 cm     Diastology LVIDs:         2.60 cm     LV e' medial:    4.33 cm/s LV PW:         0.80 cm     LV E/e' medial:  20.2 LV IVS:        0.80 cm     LV e' lateral:   6.25 cm/s LVOT diam:     1.80 cm     LV E/e' lateral: 14.0 LV SV:         47  LV SV Index:   32 LVOT Area:     2.54 cm  LV Volumes (MOD) LV vol d, MOD A2C: 98.6 ml LV vol d, MOD A4C: 67.2 ml LV vol s, MOD A2C: 36.0 ml LV vol s, MOD A4C: 38.1 ml LV SV MOD A2C:     62.6 ml LV SV MOD A4C:     67.2 ml LV SV MOD BP:      46.7 ml RIGHT VENTRICLE TAPSE (M-mode): 1.6 cm LEFT ATRIUM             Index       RIGHT ATRIUM          Index LA diam:        3.10 cm 2.11 cm/m  RA Area:     6.12 cm LA Vol (A2C):   20.0 ml 13.63 ml/m RA Volume:   10.50 ml 7.16 ml/m LA Vol (A4C):   22.5 ml 15.34 ml/m LA Biplane Vol: 22.1 ml 15.07 ml/m  AORTIC VALVE LVOT Vmax:   79.90 cm/s LVOT Vmean:  62.000 cm/s LVOT VTI:    0.185 m  AORTA Ao Root diam: 2.50 cm MITRAL VALVE MV Area (PHT): 3.31 cm     SHUNTS MV Decel Time: 229 msec     Systemic VTI:  0.18 m MV E velocity: 87.40 cm/s   Systemic Diam: 1.80 cm MV A velocity: 113.00 cm/s MV E/A ratio:  0.77 Buford Dresser MD Electronically signed by Buford Dresser MD Signature Date/Time: 09/09/2020/12:07:01 PM    Final      LOS: 2 days   Oren Binet, MD  [facility]    To contact the attending provider between 7A-7P or the covering provider during after hours 7P-7A, please log into the web  site [email or web address] and access using universal [place] password for that web site. If you do not have the password, please call the hospital operator.  09/10/2020, 12:07 PM

## 2020-09-10 NOTE — Progress Notes (Signed)
Alejandra Ford 10:05 AM  Subjective: Patient says she did okay with her prep and her history was reviewed and her case discussed with my partners Dr. Michail Sermon and Dr. Alessandra Bevels and her previous procedure was reviewed and she says her last colonoscopy was 7 years ago and she is aware of the CT findings but has not talked to a surgeon about the timing of her possible surgery  Objective: Vital signs stable afebrile no acute distress exam please see preassessment evaluation labs stable and reviewed CT reviewed  Assessment: Ascending colon mass on CT scan  Plan: Okay to proceed with colonoscopy today with anesthesia assistance and will need surgical consultation probably pending those findings  [facility]  office [phone] After 5PM or if no answer call [phone]

## 2020-09-10 NOTE — Anesthesia Preprocedure Evaluation (Addendum)
Anesthesia Evaluation  Patient identified by MRN, date of birth, ID band Patient awake    Reviewed: Allergy & Precautions, H&P , NPO status , Patient's Chart, lab work & pertinent test results  Airway Mallampati: II  TM Distance: >3 FB Neck ROM: Full    Dental no notable dental hx. (+) Teeth Intact, Dental Advisory Given   Pulmonary neg pulmonary ROS, former smoker,    Pulmonary exam normal breath sounds clear to auscultation       Cardiovascular hypertension, + CAD, + Past MI and + Peripheral Vascular Disease   Rhythm:Regular Rate:Normal     Neuro/Psych negative neurological ROS  negative psych ROS   GI/Hepatic negative GI ROS, Neg liver ROS,   Endo/Other  negative endocrine ROS  Renal/GU negative Renal ROS  negative genitourinary   Musculoskeletal   Abdominal   Peds  Hematology  (+) Blood dyscrasia, anemia ,   Anesthesia Other Findings   Reproductive/Obstetrics negative OB ROS                            Anesthesia Physical Anesthesia Plan  ASA: 2  Anesthesia Plan: MAC   Post-op Pain Management:    Induction: Intravenous  PONV Risk Score and Plan: 2 and Propofol infusion and Treatment may vary due to age or medical condition  Airway Management Planned: Simple Face Mask  Additional Equipment:   Intra-op Plan:   Post-operative Plan:   Informed Consent: I have reviewed the patients History and Physical, chart, labs and discussed the procedure including the risks, benefits and alternatives for the proposed anesthesia with the patient or authorized representative who has indicated his/her understanding and acceptance.     Dental advisory given  Plan Discussed with: CRNA  Anesthesia Plan Comments:         Anesthesia Quick Evaluation

## 2020-09-10 NOTE — Consult Note (Signed)
Consult Note  Alejandra Ford 28-Oct-1945  [id number].    Requesting MD: Dr. Watt Climes Chief Complaint/Reason for Consult: colon mass  HPI:  75 year old female with past medical history of PAD SP left right femorofemoral bypass and aorto by common iliac artery bypass, former smoker, HLD, COVID-19 in October 2021 who was admitted for NSTEMI on 7/5.  Cardiology has evaluated and echo performed.  She was initially started on IV heparin.  She underwent cardiac cath which showed complete infarct and no plans for revascularization.  Prior to admission for NSTEMI she had lost 16 pounds in the last 2 months and was undergoing outpatient work-up which included CT showing evidence of colonic mass.  She was scheduled to undergo colonoscopy with [facility] on 09/11/2020 but due to admission this was performed today by Dr. Watt Climes.   Findings of "An ulcerated non-obstructing large mass was found in the ascending colon. The mass was  circumferential. The mass measured Almost the entire a sending cm in length. No bleeding was present. Biopsies were taken with a cold forceps for histology. Area was tattooed with an injection of 4 mL of Spot (carbon black) over 3 injections probably at the hepatic flexure."  She is feeling well following colonoscopy - some fatigue. Leading up to admission she was eating and drinking well without nausea, emesis, or abdominal pain. She does have a low appetite. She denies fever, chills, SHOB, constipation, diarrhea, melena/hematochezia.  Substance use: Former smoker Allergies: none Surgeries: aortobifemoral bypass greater than 11 years ago  ROS: Review of Systems  Constitutional:  Positive for malaise/fatigue and weight loss. Negative for chills and fever.  Respiratory:  Negative for cough, shortness of breath and wheezing.   Cardiovascular:  Positive for chest pain and palpitations. Negative for orthopnea and leg swelling.  Gastrointestinal:  Negative for abdominal pain, blood in  stool, constipation, diarrhea, melena, nausea and vomiting.   History reviewed. No pertinent family history.  Past Medical History:  Diagnosis Date   HLD (hyperlipidemia)    PAD (peripheral artery disease) (HCC)    Tobacco abuse     Past Surgical History:  Procedure Laterality Date   LEFT HEART CATH AND CORONARY ANGIOGRAPHY N/A 09/09/2020   Procedure: LEFT HEART CATH AND CORONARY ANGIOGRAPHY;  Surgeon: Wellington Hampshire, MD;  Location: [facility];  Service: Cardiovascular;  Laterality: N/A;    Social History:  reports that she has quit smoking. Her smoking use included cigarettes. She has quit using smokeless tobacco. No history on file for alcohol use and drug use.  Allergies: No Known Allergies  Medications Prior to Admission  Medication Sig Dispense Refill   acetaminophen (TYLENOL) 500 MG tablet Take 500 mg by mouth every 6 (six) hours as needed for moderate pain.     aspirin EC 81 MG tablet Take 81 mg by mouth daily. Swallow whole.     atorvastatin (LIPITOR) 40 MG tablet Take 40 mg by mouth every evening.     Cholecalciferol (VITAMIN D) 50 MCG (2000 UT) CAPS Take 1 capsule by mouth at bedtime.     ibuprofen (ADVIL) 200 MG tablet Take 200 mg by mouth every 6 (six) hours as needed.     polyethylene glycol (MIRALAX / GLYCOLAX) 17 g packet Take 17 g by mouth daily as needed for moderate constipation.     traZODone (DESYREL) 50 MG tablet Take 50 mg by mouth at bedtime.      Blood pressure 139/76, pulse 87, temperature 98.7 F (  37.1 C), temperature source Oral, resp. rate 18, height 5\' 3"  (1.6 m), weight 47.4 kg, SpO2 97 %. Physical Exam:  General: pleasant, WD, female who is laying in bed in NAD HEENT: head is normocephalic, atraumatic.  Sclera are noninjected.  PERRL.  Ears and nose without any masses or lesions.  Mouth is pink and moist Heart: regular, rate, and rhythm.  Normal s1,s2. No obvious murmurs, gallops, or rubs noted.  Palpable radial and pedal pulses  bilaterally Lungs: CTAB, no wheezes, rhonchi, or rales noted.  Respiratory effort nonlabored Abd: soft, NT, ND, +BS, no masses, hernias, or organomegaly. Well healed midline surgical scar MS: all 4 extremities are symmetrical with no cyanosis, clubbing, or edema. Skin: warm and dry with no masses, lesions, or rashes Neuro: Cranial nerves 2-12 grossly intact, sensation is normal throughout Psych: A&Ox3 with an appropriate affect.   Results for orders placed or performed during the hospital encounter of 09/08/20 (from the past 48 hour(s))  Basic metabolic panel     Status: Abnormal   Collection Time: 09/08/20  1:10 PM  Result Value Ref Range   Sodium 129 (L) 135 - 145 mmol/L   Potassium 4.2 3.5 - 5.1 mmol/L   Chloride 93 (L) 98 - 111 mmol/L   CO2 27 22 - 32 mmol/L   Glucose, Bld 111 (H) 70 - 99 mg/dL    Comment: Glucose reference range applies only to samples taken after fasting for at least 8 hours.   BUN 6 (L) 8 - 23 mg/dL   Creatinine, Ser 0.68 0.44 - 1.00 mg/dL   Calcium 9.4 8.9 - 10.3 mg/dL   GFR, Estimated >60 >60 mL/min    Comment: (NOTE) Calculated using the CKD-EPI Creatinine Equation (2021)    Anion gap 9 5 - 15    Comment: Performed at [facility] [address] [place], [place] [address]  CBC     Status: Abnormal   Collection Time: 09/08/20  1:10 PM  Result Value Ref Range   WBC 12.1 (H) 4.0 - 10.5 K/uL   RBC 4.38 3.87 - 5.11 MIL/uL   Hemoglobin 11.6 (L) 12.0 - 15.0 g/dL   HCT 36.2 36.0 - 46.0 %   MCV 82.6 80.0 - 100.0 fL   MCH 26.5 26.0 - 34.0 pg   MCHC 32.0 30.0 - 36.0 g/dL   RDW 15.7 (H) 11.5 - 15.5 %   Platelets 557 (H) 150 - 400 K/uL   nRBC 0.0 0.0 - 0.2 %    Comment: Performed at [facility] [address] [place], [place] [address]  Troponin I (High Sensitivity)     Status: Abnormal   Collection Time: 09/08/20  1:10 PM  Result Value Ref Range   Troponin I (High Sensitivity) 13,980 (HH) <18 ng/L    Comment: CRITICAL RESULT CALLED TO,  READ BACK BY AND VERIFIED WITH: Estil Daft RN [phone] BY A BENNETT (NOTE) Elevated high sensitivity troponin I (hsTnI) values and significant  changes across serial measurements may suggest ACS but many other  chronic and acute conditions are known to elevate hsTnI results.  Refer to the Links section for chest pain algorithms and additional  guidance. Performed at [facility], [place] [address], [place], [place] [address]   Troponin I (High Sensitivity)     Status: Abnormal   Collection Time: 09/08/20  3:02 PM  Result Value Ref Range   Troponin I (High Sensitivity) 13,592 (HH) <18 ng/L    Comment:  CRITICAL VALUE NOTED.  VALUE IS CONSISTENT WITH PREVIOUSLY REPORTED AND CALLED VALUE. (NOTE) Elevated high sensitivity troponin I (hsTnI) values and significant  changes across serial measurements may suggest ACS but many other  chronic and acute conditions are known to elevate hsTnI results.  Refer to the Links section for chest pain algorithms and additional  guidance. Performed at [facility], [place] [address], [place], [place] [address]   Resp Panel by RT-PCR (Flu A&B, Covid) Nasopharyngeal Swab     Status: None   Collection Time: 09/08/20  3:10 PM   Specimen: Nasopharyngeal Swab; Nasopharyngeal(NP) swabs in vial transport medium  Result Value Ref Range   SARS Coronavirus 2 by RT PCR NEGATIVE NEGATIVE    Comment: (NOTE) SARS-CoV-2 target nucleic acids are NOT DETECTED.  The SARS-CoV-2 RNA is generally detectable in upper respiratory specimens during the acute phase of infection. The lowest concentration of SARS-CoV-2 viral copies this assay can detect is 138 copies/mL. A negative result does not preclude SARS-Cov-2 infection and should not be used as the sole basis for treatment or other patient management decisions. A negative result may occur with  improper specimen collection/handling, submission of specimen other than nasopharyngeal swab, presence of viral  mutation(s) within the areas targeted by this assay, and inadequate number of viral copies(<138 copies/mL). A negative result must be combined with clinical observations, patient history, and epidemiological information. The expected result is Negative.  Fact Sheet for Patients:  EntrepreneurPulse.com.au  Fact Sheet for Healthcare Providers:  IncredibleEmployment.be  This test is no t yet approved or cleared by the [place] FDA and  has been authorized for detection and/or diagnosis of SARS-CoV-2 by FDA under an Emergency Use Authorization (EUA). This EUA will remain  in effect (meaning this test can be used) for the duration of the COVID-19 declaration under Section 564(b)(1) of the Act, 21 U.S.C.section 360bbb-3(b)(1), unless the authorization is terminated  or revoked sooner.       Influenza A by PCR NEGATIVE NEGATIVE   Influenza B by PCR NEGATIVE NEGATIVE    Comment: (NOTE) The Xpert Xpress SARS-CoV-2/FLU/RSV plus assay is intended as an aid in the diagnosis of influenza from Nasopharyngeal swab specimens and should not be used as a sole basis for treatment. Nasal washings and aspirates are unacceptable for Xpert Xpress SARS-CoV-2/FLU/RSV testing.  Fact Sheet for Patients: EntrepreneurPulse.com.au  Fact Sheet for Healthcare Providers: IncredibleEmployment.be  This test is not yet approved or cleared by the [place] FDA and has been authorized for detection and/or diagnosis of SARS-CoV-2 by FDA under an Emergency Use Authorization (EUA). This EUA will remain in effect (meaning this test can be used) for the duration of the COVID-19 declaration under Section 564(b)(1) of the Act, 21 U.S.C. section 360bbb-3(b)(1), unless the authorization is terminated or revoked.  Performed at [facility], [place] [address], [place], [place] [address]   Troponin I (High Sensitivity)     Status:  Abnormal   Collection Time: 09/08/20  6:14 PM  Result Value Ref Range   Troponin I (High Sensitivity) 13,401 (HH) <18 ng/L    Comment: CRITICAL VALUE NOTED.  VALUE IS CONSISTENT WITH PREVIOUSLY REPORTED AND CALLED VALUE. (NOTE) Elevated high sensitivity troponin I (hsTnI) values and significant  changes across serial measurements may suggest ACS but many other  chronic and acute conditions are known to elevate hsTnI results.  Refer to the Links section for chest pain algorithms and additional  guidance. Performed at [facility], [address] [place], [place] [address]   Type and screen [place]     Status: None   Collection Time: 09/08/20  6:14 PM  Result Value Ref Range   ABO/RH(D) O POS    Antibody Screen NEG    Sample Expiration      09/11/2020,2359 Performed at [facility], [place] [address], [place], [place] [address]   Troponin I (High Sensitivity)     Status: Abnormal   Collection Time: 09/08/20  8:14 PM  Result Value Ref Range   Troponin I (High Sensitivity) 11,720 (HH) <18 ng/L    Comment: CRITICAL VALUE NOTED.  VALUE IS CONSISTENT WITH PREVIOUSLY REPORTED AND CALLED VALUE. Performed at [facility], [place] [address] [place], [place] [address]   Heparin level (unfractionated)     Status: Abnormal   Collection Time: 09/08/20 10:48 PM  Result Value Ref Range   Heparin Unfractionated <0.10 (L) 0.30 - 0.70 IU/mL    Comment: (NOTE) The clinical reportable range upper limit is being lowered to >1.10 to align with the FDA approved guidance for the current laboratory assay.  If heparin results are below expected values, and patient dosage has  been confirmed, suggest follow up testing of antithrombin III levels. Performed at [facility], [place] [address], [place], [place] [address]   Troponin I (High Sensitivity)     Status: Abnormal   Collection Time: 09/08/20 10:48 PM  Result Value Ref Range   Troponin I (High Sensitivity)  12,104 (HH) <18 ng/L    Comment: CRITICAL VALUE NOTED.  VALUE IS CONSISTENT WITH PREVIOUSLY REPORTED AND CALLED VALUE. (NOTE) Elevated high sensitivity troponin I (hsTnI) values and significant  changes across serial measurements may suggest ACS but many other  chronic and acute conditions are known to elevate hsTnI results.  Refer to the Links section for chest pain algorithms and additional  guidance. Performed at [facility], [place] [address], [place], [place] [address]   Comprehensive metabolic panel     Status: Abnormal   Collection Time: 09/09/20  7:26 AM  Result Value Ref Range   Sodium 129 (L) 135 - 145 mmol/L   Potassium 4.0 3.5 - 5.1 mmol/L   Chloride 96 (L) 98 - 111 mmol/L   CO2 24 22 - 32 mmol/L   Glucose, Bld 106 (H) 70 - 99 mg/dL    Comment: Glucose reference range applies only to samples taken after fasting for at least 8 hours.   BUN 7 (L) 8 - 23 mg/dL   Creatinine, Ser 0.73 0.44 - 1.00 mg/dL   Calcium 8.7 (L) 8.9 - 10.3 mg/dL   Total Protein 5.9 (L) 6.5 - 8.1 g/dL   Albumin 2.7 (L) 3.5 - 5.0 g/dL   AST 78 (H) 15 - 41 U/L   ALT 15 0 - 44 U/L   Alkaline Phosphatase 64 38 - 126 U/L   Total Bilirubin 0.5 0.3 - 1.2 mg/dL   GFR, Estimated >60 >60 mL/min    Comment: (NOTE) Calculated using the CKD-EPI Creatinine Equation (2021)    Anion gap 9 5 - 15    Comment: Performed at [facility], [place] [address] [place], [place] [address]  Heparin level (unfractionated)     Status: Abnormal   Collection Time: 09/09/20  7:26 AM  Result Value Ref Range   Heparin Unfractionated 0.13 (L) 0.30 - 0.70 IU/mL    Comment: (NOTE) The clinical reportable range upper limit is  being lowered to >1.10 to align with the FDA approved guidance for the current laboratory assay.  If heparin results are below expected values, and patient dosage has  been confirmed, suggest follow up testing of antithrombin III levels. Performed at [facility], [place] [address],  [place], [place] [address]   CBC     Status: Abnormal   Collection Time: 09/09/20  7:26 AM  Result Value Ref Range   WBC 13.4 (H) 4.0 - 10.5 K/uL   RBC 4.07 3.87 - 5.11 MIL/uL   Hemoglobin 10.8 (L) 12.0 - 15.0 g/dL   HCT 32.6 (L) 36.0 - 46.0 %   MCV 80.1 80.0 - 100.0 fL   MCH 26.5 26.0 - 34.0 pg   MCHC 33.1 30.0 - 36.0 g/dL   RDW 15.6 (H) 11.5 - 15.5 %   Platelets 502 (H) 150 - 400 K/uL   nRBC 0.0 0.0 - 0.2 %    Comment: Performed at [facility] [address] [place], [place] [address]  Lipid panel     Status: None   Collection Time: 09/09/20  7:26 AM  Result Value Ref Range   Cholesterol 122 0 - 200 mg/dL   Triglycerides 86 <150 mg/dL   HDL 45 >40 mg/dL   Total CHOL/HDL Ratio 2.7 RATIO   VLDL 17 0 - 40 mg/dL   LDL Cholesterol 60 0 - 99 mg/dL    Comment:        Total Cholesterol/HDL:CHD Risk Coronary Heart Disease Risk Table                     Men   Women  1/2 Average Risk   3.4   3.3  Average Risk       5.0   4.4  2 X Average Risk   9.6   7.1  3 X Average Risk  23.4   11.0        Use the calculated Patient Ratio above and the CHD Risk Table to determine the patient's CHD Risk.        ATP III CLASSIFICATION (LDL):  <100     mg/dL   Optimal  100-129  mg/dL   Near or Above                    Optimal  130-159  mg/dL   Borderline  160-189  mg/dL   High  >190     mg/dL   Very High Performed at [facility] [address], [place], [place] [address]   CEA     Status: None   Collection Time: 09/09/20  1:10 PM  Result Value Ref Range   CEA 2.4 0.0 - 4.7 ng/mL    Comment: (NOTE)                             Nonsmokers          <3.9                             Smokers             <5.6 [employer] Electrochemiluminescence Immunoassay (ECLIA) Values obtained with different assay methods or kits cannot be used interchangeably.  Results cannot be interpreted as absolute evidence of the presence or absence of malignant disease. Performed At: [facility] [employer] [address]  [place], [place] [id number] Rush Farmer MD RW:[id number]   Osmolality     Status: Abnormal   Collection Time: 09/09/20  1:10 PM  Result Value Ref Range   Osmolality 270 (L) 275 - 295 mOsm/kg    Comment: Performed at [facility], [place] [address] [place], [place] [address]  TSH     Status: None   Collection Time: 09/09/20  1:10 PM  Result Value Ref Range   TSH 1.431 0.350 - 4.500 uIU/mL    Comment: Performed by a 3rd Generation assay with a functional sensitivity of <=0.01 uIU/mL. Performed at [facility], [place] [address], [place], [place] [address]   CBC     Status: Abnormal   Collection Time: 09/10/20  5:27 AM  Result Value Ref Range   WBC 10.3 4.0 - 10.5 K/uL   RBC 3.49 (L) 3.87 - 5.11 MIL/uL   Hemoglobin 9.4 (L) 12.0 - 15.0 g/dL   HCT 28.3 (L) 36.0 - 46.0 %   MCV 81.1 80.0 - 100.0 fL   MCH 26.9 26.0 - 34.0 pg   MCHC 33.2 30.0 - 36.0 g/dL   RDW 15.7 (H) 11.5 - 15.5 %   Platelets 422 (H) 150 - 400 K/uL   nRBC 0.0 0.0 - 0.2 %    Comment: Performed at [facility] [address], [place], [place] [address]  Basic metabolic panel     Status: Abnormal   Collection Time: 09/10/20  5:27 AM  Result Value Ref Range   Sodium 132 (L) 135 - 145 mmol/L   Potassium 3.9 3.5 - 5.1 mmol/L   Chloride 102 98 - 111 mmol/L   CO2 23 22 - 32 mmol/L   Glucose, Bld 96 70 - 99 mg/dL    Comment: Glucose reference range applies only to samples taken after fasting for at least 8 hours.   BUN 9 8 - 23 mg/dL   Creatinine, Ser 0.68 0.44 - 1.00 mg/dL   Calcium 8.6 (L) 8.9 - 10.3 mg/dL   GFR, Estimated >60 >60 mL/min    Comment: (NOTE) Calculated using the CKD-EPI Creatinine Equation (2021)    Anion gap 7 5 - 15    Comment: Performed at [facility] [address], [place], [place] [address]   DG Chest 2 View  Result Date: 09/08/2020 CLINICAL DATA:  Chest pain. EXAM: CHEST - 2 VIEW COMPARISON:  CT 08/28/2020.  Chest x-ray 08/27/2020. FINDINGS:  Mediastinum hilar structures normal. No focal infiltrate. Previously described pulmonary nodules best identified by prior CT. No pleural effusion or pneumothorax. Heart size normal. No acute bony abnormality. IMPRESSION: No acute cardiopulmonary disease. Electronically Signed   By: Marcello Moores  Register   On: 09/08/2020 13:35   CARDIAC CATHETERIZATION  Result Date: 09/09/2020  Dist Cx lesion is 100% stenosed.  Prox RCA lesion is 50% stenosed.  1.  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  There is also moderate proximal RCA disease. 2.  Left ventricular angiography was not performed.  EF was low normal by echo with posterior wall hypokinesis.  Mildly elevated left ventricular end-diastolic pressure. Recommendations: This is a late presenting posterior myocardial infarction.  The occlusion involves distal left circumflex which has already collateralized.  The patient is chest pain-free and thus no indication for revascularization at this point.  I recommend continuing medical therapy. I discontinued heparin drip. The patient can proceed with endoscopic GI procedures for  diagnosis of colon mass at an overall low to moderate risk.   ECHOCARDIOGRAM COMPLETE  Result Date: 09/09/2020    ECHOCARDIOGRAM REPORT   Patient Name:   JUNI GLAAB Date of Exam: 09/09/2020 Medical Rec #:  [id number]    Height:       63.0 in Accession #:    [id number]   Weight:       104.4 lb Date of Birth:  10/07/1945    BSA:          1.467 m Patient Age:    42 years     BP:           126/78 mmHg Patient Gender: F            HR:           84 bpm. Exam Location:  Inpatient Procedure: 2D Echo, Cardiac Doppler, Color Doppler and Intracardiac            Opacification Agent Indications:    NSTEMI I21.4  History:        Patient has prior history of Echocardiogram examinations, most                 recent 06/20/2008. Risk Factors:Dyslipidemia. PAD.  Sonographer:     Vickie Epley RDCS Referring Phys: [id number] [facility] IMPRESSIONS  1. Left ventricular ejection fraction, by estimation, is 50 to 55%. The left ventricle has low normal function. The left ventricle demonstrates regional wall motion abnormalities (see scoring diagram/findings for description). Left ventricular diastolic  parameters are indeterminate. Elevated left ventricular end-diastolic pressure. There is hypokinesis of the left ventricular, mid-apical inferior wall, inferolateral wall and anterolateral wall. There is akinesis of the left ventricular, apical inferior  wall.  2. Right ventricular systolic function is normal. The right ventricular size is normal. Tricuspid regurgitation signal is inadequate for assessing PA pressure.  3. The mitral valve is normal in structure. Mild mitral valve regurgitation. No evidence of mitral stenosis.  4. The aortic valve is tricuspid. There is mild calcification of the aortic valve. There is mild thickening of the aortic valve. Aortic valve regurgitation is not visualized. Mild aortic valve sclerosis is present, with no evidence of aortic valve stenosis.  5. The inferior vena cava is normal in size with greater than 50% respiratory variability, suggesting right atrial pressure of 3 mmHg. Comparison(s): Prior images unable to be directly viewed, comparison made by report only. Conclusion(s)/Recommendation(s): Wall motion abnormalities in the lateral and inferior wall. Results communicated to primary team, cardiology team also aware. FINDINGS  Left Ventricle: LV thrombus excluded by contrast. Left ventricular ejection fraction, by estimation, is 50 to 55%. The left ventricle has low normal function. The left ventricle demonstrates regional wall motion abnormalities. Definity contrast agent was given IV to delineate the left ventricular endocardial borders. The left ventricular internal cavity size was normal in size. There is no left ventricular hypertrophy. Left  ventricular diastolic parameters are indeterminate. Elevated left ventricular  end-diastolic pressure. Right Ventricle: The right ventricular size is normal. No increase in right ventricular wall thickness. Right ventricular systolic function is normal. Tricuspid regurgitation signal is inadequate for assessing PA pressure. Left Atrium: Left atrial size was normal in size. Right Atrium: Right atrial size was normal in size. Pericardium: There is no evidence of pericardial effusion. Mitral Valve: The mitral valve is normal in structure. Mild mitral valve regurgitation. No evidence of mitral valve stenosis. Tricuspid Valve: The tricuspid valve is normal in structure. Tricuspid valve regurgitation is trivial.  No evidence of tricuspid stenosis. Aortic Valve: The aortic valve is tricuspid. There is mild calcification of the aortic valve. There is mild thickening of the aortic valve. Aortic valve regurgitation is not visualized. Mild aortic valve sclerosis is present, with no evidence of aortic valve stenosis. Pulmonic Valve: The pulmonic valve was not well visualized. Pulmonic valve regurgitation is not visualized. No evidence of pulmonic stenosis. Aorta: The aortic root, ascending aorta, aortic arch and descending aorta are all structurally normal, with no evidence of dilitation or obstruction. Venous: The inferior vena cava is normal in size with greater than 50% respiratory variability, suggesting right atrial pressure of 3 mmHg. IAS/Shunts: The atrial septum is grossly normal.  LEFT VENTRICLE PLAX 2D LVIDd:         3.60 cm     Diastology LVIDs:         2.60 cm     LV e' medial:    4.33 cm/s LV PW:         0.80 cm     LV E/e' medial:  20.2 LV IVS:        0.80 cm     LV e' lateral:   6.25 cm/s LVOT diam:     1.80 cm     LV E/e' lateral: 14.0 LV SV:         47 LV SV Index:   32 LVOT Area:     2.54 cm  LV Volumes (MOD) LV vol d, MOD A2C: 98.6 ml LV vol d, MOD A4C: 67.2 ml LV vol s, MOD A2C: 36.0 ml LV vol s, MOD A4C:  38.1 ml LV SV MOD A2C:     62.6 ml LV SV MOD A4C:     67.2 ml LV SV MOD BP:      46.7 ml RIGHT VENTRICLE TAPSE (M-mode): 1.6 cm LEFT ATRIUM             Index       RIGHT ATRIUM          Index LA diam:        3.10 cm 2.11 cm/m  RA Area:     6.12 cm LA Vol (A2C):   20.0 ml 13.63 ml/m RA Volume:   10.50 ml 7.16 ml/m LA Vol (A4C):   22.5 ml 15.34 ml/m LA Biplane Vol: 22.1 ml 15.07 ml/m  AORTIC VALVE LVOT Vmax:   79.90 cm/s LVOT Vmean:  62.000 cm/s LVOT VTI:    0.185 m  AORTA Ao Root diam: 2.50 cm MITRAL VALVE MV Area (PHT): 3.31 cm     SHUNTS MV Decel Time: 229 msec     Systemic VTI:  0.18 m MV E velocity: 87.40 cm/s   Systemic Diam: 1.80 cm MV A velocity: 113.00 cm/s MV E/A ratio:  0.77 Buford Dresser MD Electronically signed by Buford Dresser MD Signature Date/Time: 09/09/2020/12:07:01 PM    Final      Assessment/Plan Ascending colon mass - Nonobstructive. Underwent colonoscopy by Dr. Watt Climes on 7/7 - pathology pending - CT chest with "scattered tiny lung nodules - most likely areas of scarring. Metastatic lesions are not excluded, but felt unlikely" - will get CEA  - no current obstruction or bleeding - will discuss with MD and definitive surgical timeline planning pending  FEN: Clears for now ID: none VTE: Lovenox  NSTEMI -Cardiology following.  No plans for revascularization - Echo showed EF of 50-55% - Medical management, ASA, statin, metoprolol   Winferd Humphrey, [facility]  09/10/2020, 12:13 PM Please see Amion for pager number during day hours 7:00am-4:30pm

## 2020-09-10 NOTE — Op Note (Signed)
[facility] Patient Name: Alejandra Ford Procedure Date : 09/10/2020 MRN: [id number] Attending MD: Clarene Essex , MD Date of Birth: [birth year] CSN: [id number] Age: 75 Admit Type: Inpatient Procedure:                Colonoscopy Indications:              Last colonoscopy: 2017, Abnormal CT of the GI tract Providers:                Clarene Essex, MD, Vista Lawman, RN, Tyrone Apple,                            Technician, Elmer Sow Referring MD:              Medicines:                Propofol total dose 180 mg IV, 60 mg IV lidocaine Complications:            No immediate complications. Estimated Blood Loss:     Estimated blood loss: none. Procedure:                Pre-Anesthesia Assessment:                           - Prior to the procedure, a History and Physical                            was performed, and patient medications and                            allergies were reviewed. The patient's tolerance of                            previous anesthesia was also reviewed. The risks                            and benefits of the procedure and the sedation                            options and risks were discussed with the patient.                            All questions were answered, and informed consent                            was obtained. Prior Anticoagulants: The patient has                            taken no previous anticoagulant or antiplatelet                            agents except for aspirin. ASA Grade Assessment:                            III - A patient with severe systemic disease. After  reviewing the risks and benefits, the patient was                            deemed in satisfactory condition to undergo the                            procedure.                           After obtaining informed consent, the colonoscope                            was passed under direct vision. Throughout the                             procedure, the patient's blood pressure, pulse, and                            oxygen saturations were monitored continuously. The                            PCF-H190DL ([id number] Olympus pediatric colonoscope                            was introduced through the anus and advanced to the                            the cecum, identified by appendiceal orifice and                            ileocecal valve. The ileocecal valve, appendiceal                            orifice, and rectum were photographed. The                            colonoscopy was performed without difficulty. The                            patient tolerated the procedure well. The quality                            of the bowel preparation was adequate to identify                            polyps 6 mm and larger in size. Scope In: 10:56:31 AM Scope Out: 11:14:28 AM Scope Withdrawal Time: 0 hours 10 minutes 37 seconds  Total Procedure Duration: 0 hours 17 minutes 57 seconds  Findings:      Internal hemorrhoids were found during retroflexion, during perianal       exam and during digital exam. The hemorrhoids were small.      An ulcerated non-obstructing large mass was found in the ascending       colon. The mass was circumferential. The mass measured Almost the entire  a sending cm in length. No bleeding was present. Biopsies were taken       with a cold forceps for histology. Area was tattooed with an injection       of 4 mL of Spot (carbon black) over 3 injections probably at the hepatic       flexure.      The exam was otherwise without abnormality. Impression:               - Internal hemorrhoids.                           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                            Biopsied. Tattooed.                           - The examination was otherwise normal. Recommendation:           - Clear liquid diet today. If surgery not to be                            done tomorrow then may  advance to soft solids                           - Continue present medications.                           - Await pathology results.                           - Repeat colonoscopy after studies are complete for                            surveillance based on clinical status at that time.                           - Return to GI clinic PRN.                           - Telephone GI clinic for pathology results in 3                            days.                           - Telephone GI clinic if symptomatic PRN.                           - Refer to a colo-rectal surgeon today to see if                            surgery can be done tomorrow while she is already                            prepped and will need obviously cardiology  approval                            as well. Procedure Code(s):        --- Professional ---                           [phone], Colonoscopy, flexible; with directed                            submucosal injection(s), any substance                           [id number], Colonoscopy, flexible; with biopsy, single                            or multiple Diagnosis Code(s):        --- Professional ---                           D49.0, Neoplasm of unspecified behavior of                            digestive system                           K64.8, Other hemorrhoids                           R93.3, Abnormal findings on diagnostic imaging of                            other parts of digestive tract CPT copyright 2019 [employer]. All rights reserved. The codes documented in this report are preliminary and upon coder review may  be revised to meet current compliance requirements. Clarene Essex, MD 09/10/2020 11:23:26 AM This report has been signed electronically. Number of Addenda: 0

## 2020-09-10 NOTE — Progress Notes (Addendum)
Progress Note  Patient Name: Alejandra Ford Date of Encounter: 09/10/2020  [place] Cardiologist: None   Subjective   No complaints this morning.   Inpatient Medications    Scheduled Meds:  aspirin EC  81 mg Oral Daily   atorvastatin  80 mg Oral Daily   enoxaparin (LOVENOX) injection  40 mg Subcutaneous Q24H   feeding supplement  237 mL Oral TID BM   metoprolol tartrate  12.5 mg Oral BID   multivitamin with minerals  1 tablet Oral Daily   sodium chloride flush  3 mL Intravenous Q12H   sodium chloride flush  3 mL Intravenous Q12H   traZODone  50 mg Oral QHS   Continuous Infusions:  sodium chloride     sodium chloride     PRN Meds: sodium chloride, acetaminophen **OR** acetaminophen, nitroGLYCERIN, ondansetron **OR** ondansetron (ZOFRAN) IV, polyethylene glycol, sodium chloride flush   Vital Signs    Vitals:   09/09/20 1524 09/09/20 1614 09/09/20 2024 09/10/20 0534  BP: 122/65  138/67 121/60  Pulse: 82 87 87 83  Resp:  17 16 16   Temp:  98.8 F (37.1 C) 97.7 F (36.5 C) 98.4 F (36.9 C)  TempSrc:  Oral Oral Oral  SpO2: 99% 96% 100% 100%  Weight:      Height:        Intake/Output Summary (Last 24 hours) at 09/10/2020 0841 Last data filed at 09/09/2020 2137 Gross per 24 hour  Intake 3 ml  Output --  Net 3 ml   Last 3 Weights 09/08/2020 09/08/2020  Weight (lbs) 104 lb 6.4 oz 106 lb  Weight (kg) 47.356 kg 48.081 kg      Telemetry    SR - Personally Reviewed  ECG    No new tracing.  Physical Exam   GEN: No acute distress.   Neck: No JVD Cardiac: RRR, no murmurs, rubs, or gallops.  Respiratory: Clear to auscultation bilaterally. GI: Soft, nontender, non-distended  MS: No edema; No deformity. Right radial cath site stable.  Neuro:  Nonfocal  Psych: Normal affect   Labs    High Sensitivity Troponin:   Recent Labs  Lab 09/08/20 1310 09/08/20 1502 09/08/20 1814 09/08/20 2014 09/08/20 2248  TROPONINIHS 13,980* 13,592* 13,401* 11,720* 12,104*       Chemistry Recent Labs  Lab 09/08/20 1310 09/09/20 0726 09/10/20 0527  NA 129* 129* 132*  K 4.2 4.0 3.9  CL 93* 96* 102  CO2 27 24 23   GLUCOSE 111* 106* 96  BUN 6* 7* 9  CREATININE 0.68 0.73 0.68  CALCIUM 9.4 8.7* 8.6*  PROT  --  5.9*  --   ALBUMIN  --  2.7*  --   AST  --  78*  --   ALT  --  15  --   ALKPHOS  --  64  --   BILITOT  --  0.5  --   GFRNONAA >60 >60 >60  ANIONGAP 9 9 7      Hematology Recent Labs  Lab 09/08/20 1310 09/09/20 0726 09/10/20 0527  WBC 12.1* 13.4* 10.3  RBC 4.38 4.07 3.49*  HGB 11.6* 10.8* 9.4*  HCT 36.2 32.6* 28.3*  MCV 82.6 80.1 81.1  MCH 26.5 26.5 26.9  MCHC 32.0 33.1 33.2  RDW 15.7* 15.6* 15.7*  PLT 557* 502* 422*    BNPNo results for input(s): BNP, PROBNP in the last 168 hours.   DDimer No results for input(s): DDIMER in the last 168 hours.   Radiology  DG Chest 2 View  Result Date: 09/08/2020 CLINICAL DATA:  Chest pain. EXAM: CHEST - 2 VIEW COMPARISON:  CT 08/28/2020.  Chest x-ray 08/27/2020. FINDINGS: Mediastinum hilar structures normal. No focal infiltrate. Previously described pulmonary nodules best identified by prior CT. No pleural effusion or pneumothorax. Heart size normal. No acute bony abnormality. IMPRESSION: No acute cardiopulmonary disease. Electronically Signed   By: Marcello Moores  Register   On: 09/08/2020 13:35   CARDIAC CATHETERIZATION  Result Date: 09/09/2020  Dist Cx lesion is 100% stenosed.  Prox RCA lesion is 50% stenosed.  1.  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  There is also moderate proximal RCA disease. 2.  Left ventricular angiography was not performed.  EF was low normal by echo with posterior wall hypokinesis.  Mildly elevated left ventricular end-diastolic pressure. Recommendations: This is a late presenting posterior myocardial infarction.  The occlusion involves distal left circumflex which has already  collateralized.  The patient is chest pain-free and thus no indication for revascularization at this point.  I recommend continuing medical therapy. I discontinued heparin drip. The patient can proceed with endoscopic GI procedures for diagnosis of colon mass at an overall low to moderate risk.   ECHOCARDIOGRAM COMPLETE  Result Date: 09/09/2020    ECHOCARDIOGRAM REPORT   Patient Name:   [name] Date of Exam: 09/09/2020 Medical Rec #:  [id number]    Height:       63.0 in Accession #:    [id number]   Weight:       104.4 lb Date of Birth:  Dec 06, 1945    BSA:          1.467 m Patient Age:    75 years     BP:           126/78 mmHg Patient Gender: F            HR:           84 bpm. Exam Location:  Inpatient Procedure: 2D Echo, Cardiac Doppler, Color Doppler and Intracardiac            Opacification Agent Indications:    NSTEMI I21.4  History:        Patient has prior history of Echocardiogram examinations, most                 recent 06/20/2008. Risk Factors:Dyslipidemia. PAD.  Sonographer:    Vickie Epley RDCS Referring Phys: [id number] [facility]AGAT IMPRESSIONS  1. Left ventricular ejection fraction, by estimation, is 50 to 55%. The left ventricle has low normal function. The left ventricle demonstrates regional wall motion abnormalities (see scoring diagram/findings for description). Left ventricular diastolic  parameters are indeterminate. Elevated left ventricular end-diastolic pressure. There is hypokinesis of the left ventricular, mid-apical inferior wall, inferolateral wall and anterolateral wall. There is akinesis of the left ventricular, apical inferior  wall.  2. Right ventricular systolic function is normal. The right ventricular size is normal. Tricuspid regurgitation signal is inadequate for assessing PA pressure.  3. The mitral valve is normal in structure. Mild mitral valve regurgitation. No evidence of mitral stenosis.  4. The aortic valve is tricuspid. There is mild calcification of the aortic  valve. There is mild thickening of the aortic valve. Aortic valve regurgitation is not visualized. Mild aortic valve sclerosis is present, with no evidence of aortic valve stenosis.  5. The inferior vena cava is normal in size with greater than 50% respiratory  variability, suggesting right atrial pressure of 3 mmHg. Comparison(s): Prior images unable to be directly viewed, comparison made by report only. Conclusion(s)/Recommendation(s): Wall motion abnormalities in the lateral and inferior wall. Results communicated to primary team, cardiology team also aware. FINDINGS  Left Ventricle: LV thrombus excluded by contrast. Left ventricular ejection fraction, by estimation, is 50 to 55%. The left ventricle has low normal function. The left ventricle demonstrates regional wall motion abnormalities. Definity contrast agent was given IV to delineate the left ventricular endocardial borders. The left ventricular internal cavity size was normal in size. There is no left ventricular hypertrophy. Left ventricular diastolic parameters are indeterminate. Elevated left ventricular  end-diastolic pressure. Right Ventricle: The right ventricular size is normal. No increase in right ventricular wall thickness. Right ventricular systolic function is normal. Tricuspid regurgitation signal is inadequate for assessing PA pressure. Left Atrium: Left atrial size was normal in size. Right Atrium: Right atrial size was normal in size. Pericardium: There is no evidence of pericardial effusion. Mitral Valve: The mitral valve is normal in structure. Mild mitral valve regurgitation. No evidence of mitral valve stenosis. Tricuspid Valve: The tricuspid valve is normal in structure. Tricuspid valve regurgitation is trivial. No evidence of tricuspid stenosis. Aortic Valve: The aortic valve is tricuspid. There is mild calcification of the aortic valve. There is mild thickening of the aortic valve. Aortic valve regurgitation is not visualized. Mild  aortic valve sclerosis is present, with no evidence of aortic valve stenosis. Pulmonic Valve: The pulmonic valve was not well visualized. Pulmonic valve regurgitation is not visualized. No evidence of pulmonic stenosis. Aorta: The aortic root, ascending aorta, aortic arch and descending aorta are all structurally normal, with no evidence of dilitation or obstruction. Venous: The inferior vena cava is normal in size with greater than 50% respiratory variability, suggesting right atrial pressure of 3 mmHg. IAS/Shunts: The atrial septum is grossly normal.  LEFT VENTRICLE PLAX 2D LVIDd:         3.60 cm     Diastology LVIDs:         2.60 cm     LV e' medial:    4.33 cm/s LV PW:         0.80 cm     LV E/e' medial:  20.2 LV IVS:        0.80 cm     LV e' lateral:   6.25 cm/s LVOT diam:     1.80 cm     LV E/e' lateral: 14.0 LV SV:         47 LV SV Index:   32 LVOT Area:     2.54 cm  LV Volumes (MOD) LV vol d, MOD A2C: 98.6 ml LV vol d, MOD A4C: 67.2 ml LV vol s, MOD A2C: 36.0 ml LV vol s, MOD A4C: 38.1 ml LV SV MOD A2C:     62.6 ml LV SV MOD A4C:     67.2 ml LV SV MOD BP:      46.7 ml RIGHT VENTRICLE TAPSE (M-mode): 1.6 cm LEFT ATRIUM             Index       RIGHT ATRIUM          Index LA diam:        3.10 cm 2.11 cm/m  RA Area:     6.12 cm LA Vol (A2C):   20.0 ml 13.63 ml/m RA Volume:   10.50 ml 7.16 ml/m LA Vol (A4C):   22.5 ml 15.34  ml/m LA Biplane Vol: 22.1 ml 15.07 ml/m  AORTIC VALVE LVOT Vmax:   79.90 cm/s LVOT Vmean:  62.000 cm/s LVOT VTI:    0.185 m  AORTA Ao Root diam: 2.50 cm MITRAL VALVE MV Area (PHT): 3.31 cm     SHUNTS MV Decel Time: 229 msec     Systemic VTI:  0.18 m MV E velocity: 87.40 cm/s   Systemic Diam: 1.80 cm MV A velocity: 113.00 cm/s MV E/A ratio:  0.77 Buford Dresser MD Electronically signed by Buford Dresser MD Signature Date/Time: 09/09/2020/12:07:01 PM    Final     Cardiac Studies   Cath: 09/09/20  Dist Cx lesion is 100% stenosed. Prox RCA lesion is 50% stenosed.   1.   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  There is also moderate proximal RCA disease. 2.  Left ventricular angiography was not performed.  EF was low normal by echo with posterior wall hypokinesis.  Mildly elevated left ventricular end-diastolic pressure.   Recommendations: This is a late presenting posterior myocardial infarction.  The occlusion involves distal left circumflex which has already collateralized.  The patient is chest pain-free and thus no indication for revascularization at this point.  I recommend continuing medical therapy. I discontinued heparin drip. The patient can proceed with endoscopic GI procedures for diagnosis of colon mass at an overall low to moderate risk.  Diagnostic Dominance: Right   Echo: 09/09/20  IMPRESSIONS     1. Left ventricular ejection fraction, by estimation, is 50 to 55%. The  left ventricle has low normal function. The left ventricle demonstrates  regional wall motion abnormalities (see scoring diagram/findings for  description). Left ventricular diastolic   parameters are indeterminate. Elevated left ventricular end-diastolic  pressure. There is hypokinesis of the left ventricular, mid-apical  inferior wall, inferolateral wall and anterolateral wall. There is  akinesis of the left ventricular, apical inferior   wall.   2. Right ventricular systolic function is normal. The right ventricular  size is normal. Tricuspid regurgitation signal is inadequate for assessing  PA pressure.   3. The mitral valve is normal in structure. Mild mitral valve  regurgitation. No evidence of mitral stenosis.   4. The aortic valve is tricuspid. There is mild calcification of the  aortic valve. There is mild thickening of the aortic valve. Aortic valve  regurgitation is not visualized. Mild aortic valve sclerosis is present,  with no evidence of aortic valve  stenosis.   5.  The inferior vena cava is normal in size with greater than 50%  respiratory variability, suggesting right atrial pressure of 3 mmHg.   Comparison(s): Prior images unable to be directly viewed, comparison made  by report only.   Conclusion(s)/Recommendation(s): Wall motion abnormalities in the lateral  and inferior wall. Results communicated to primary team, cardiology team  also aware.   Patient Profile     75 y.o. female with PAD (follow by Dr. Donnetta Hutching), tobacco abuse, HLD, and recent CT abd with colon mass who was seen 09/08/2020 for the evaluation of chest pain and elevated troponin consistent with non-STEMI at request of Dr. Laverta Baltimore.   Assessment & Plan    NSTEMI: High-sensitivity troponin peaked at 13,980.  EKG shows T wave inversions in lateral leads. Underwent cardiac cath noted above with 100% dLCx and 50%pRCA. Recommendation for medical therapy, no plans for revascularization given completed infarct. Echo showed EFof 50-55% with inferolateral hypokinesis. Will order CR given NSTEMI  with medical management -- remains on ASA, statin, metoprolol   Colon Mass: Noted on CT scan with concern for malignancy of 7.5 cm of the lower ascending colon.  Seen by Dr. Alessandra Bevels today with plans for colonoscopy today -- CEA 2.4   PAD: Status post left to right fem/fem bypass grafting in 2009.  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 Donnetta Hutching   HLD: LDL 60 -- on statin   Anemia: Hgb 11.6>>10.8>>9.4 -- IV heparin stopped post cath   For questions or updates, please contact [place] Please consult [email or web address] for contact info under        Signed, Reino Bellis, NP  09/10/2020, 8:41 AM    I have reviewed her findings today with Reino Bellis. I have not examined the patient as she is off of the floor. Medical management of CAD. No cardiac reason not to proceed with further GI workup for her colon mass.   Lauree Chandler 09/10/2020 10:27 AM

## 2020-09-10 NOTE — Anesthesia Postprocedure Evaluation (Signed)
Anesthesia Post Note  Patient: Alejandra Ford  Procedure(s) Performed: COLONOSCOPY BIOPSY SUBMUCOSAL TATTOO INJECTION     Patient location during evaluation: Endoscopy Anesthesia Type: MAC Level of consciousness: awake and alert Pain management: pain level controlled Vital Signs Assessment: post-procedure vital signs reviewed and stable Respiratory status: spontaneous breathing, nonlabored ventilation and respiratory function stable Cardiovascular status: stable and blood pressure returned to baseline Postop Assessment: no apparent nausea or vomiting Anesthetic complications: no   No notable events documented.  Last Vitals:  Vitals:   09/10/20 1135 09/10/20 1153  BP: 130/62 139/76  Pulse: 82 87  Resp: 19 18  Temp:  37.1 C  SpO2: 98% 97%    Last Pain:  Vitals:   09/10/20 1153  TempSrc: Oral  PainSc:                  Sonia Bromell,W. EDMOND

## 2020-09-11 DIAGNOSIS — D649 Anemia, unspecified: Secondary | ICD-10-CM

## 2020-09-11 LAB — CBC
HCT: 29.2 % — ABNORMAL LOW (ref 36.0–46.0)
Hemoglobin: 9.5 g/dL — ABNORMAL LOW (ref 12.0–15.0)
MCH: 26.2 pg (ref 26.0–34.0)
MCHC: 32.5 g/dL (ref 30.0–36.0)
MCV: 80.7 fL (ref 80.0–100.0)
Platelets: 428 10*3/uL — ABNORMAL HIGH (ref 150–400)
RBC: 3.62 MIL/uL — ABNORMAL LOW (ref 3.87–5.11)
RDW: 15.4 % (ref 11.5–15.5)
WBC: 10.3 10*3/uL (ref 4.0–10.5)
nRBC: 0 % (ref 0.0–0.2)

## 2020-09-11 LAB — CEA: CEA: 2.8 ng/mL (ref 0.0–4.7)

## 2020-09-11 MED ORDER — NITROGLYCERIN 0.4 MG SL SUBL: 0.4000 mg | SUBLINGUAL_TABLET | SUBLINGUAL | 0 refills | Status: DC | PRN

## 2020-09-11 MED ORDER — ENSURE ENLIVE PO LIQD: 237.0000 mL | Freq: Three times a day (TID) | ORAL | 0 refills | Status: AC

## 2020-09-11 MED ORDER — ATORVASTATIN CALCIUM 80 MG PO TABS: 80.0000 mg | ORAL_TABLET | Freq: Every evening | ORAL | 1 refills | Status: AC

## 2020-09-11 MED ORDER — METOPROLOL TARTRATE 25 MG PO TABS: 12.5000 mg | ORAL_TABLET | Freq: Two times a day (BID) | ORAL | 1 refills | Status: DC

## 2020-09-11 NOTE — Discharge Summary (Signed)
PATIENT DETAILS Name: Alejandra Ford Age: 75 y.o. Sex: female Date of Birth: December 28, 1945 MRN: [id number]. Admitting Physician: Lavina Hamman, MD [id number], [name], MD  Admit Date: 09/08/2020 Discharge date: 09/11/2020  Recommendations for Outpatient Follow-up:  Follow up with PCP in 1-2 weeks Please obtain CMP/CBC in one week Please follow colonoscopy biopsy Please ensure outpatient follow-up with general surgery for colectomy Please ensure outpatient follow-up with cardiology  Admitted From:  Home  Disposition: [place]: No  Equipment/Devices: None  Discharge Condition: Stable  CODE STATUS: FULL CODE  Diet recommendation:  Diet Order             Diet - low sodium heart healthy           DIET SOFT Room service appropriate? Yes; Fluid consistency: Thin  Diet effective now                    Brief Narrative: Patient is a 75 y.o. female who was recently diagnosed with a colon mass on CT abdomen-presenting with chest pain-found to have non-STEMI.   Significant events: 7/5>> admitted with non-STEMI 7/6>> LHC   Significant studies: 6/24>> CT chest: Scattered tiny lung nodules-most likely scarring. 6/24>> CT abdomen/pelvis: 7.5 cm mass in the ascending colon-no evidence of metastatic disease in abdomen. 7/5>> CXR: No pneumonia 7/6>> Echo: EF 50-55%, hypokinesis mid apical/inferior/inferolateral and anterolateral wall.   Antimicrobial therapy: None   Microbiology data: 7/5>> COVID/influenza PCR: Negative   Procedures : 7/6>> LHC Dist Cx lesion is 100% stenosed. Prox RCA lesion is 50% stenosed.   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  After cardiology clearance-GI work-up  was commenced-plans are to continue aspirin/statin/beta-blocker on discharge.  Once GI/general surgery work-up is complete-patient may benefit from cardiology reevaluation.     Ascending colon mass: Known issue prior to this hospitalization-had outpatient CT abdomen that showed a mass-GI consulted-underwent colonoscopy on 7/7-biopsy pending.  General surgery planning on outpatient colectomy.  They will arrange for outpatient follow-up    Mild normocytic anemia: Probably due to underlying malignancy-MCV almost borderline microcytic.  Watch closely for now.   Thrombocytosis: Mild-could be due to underlying malignancy-stable for follow-up.   Hyponatremia: Euvolemic on exam-improving with just supportive care.  Repeat electrolytes in 1 week.   History of PAD-s/p right fem/fem bypass grafting in 2009, s/p aortobifemoral graft in 2010: On aspirin/statin-follows with vascular surgery.   Nutrition Problem: Nutrition Problem: Severe Malnutrition Etiology: acute illness (colonic mass) Signs/Symptoms: severe fat depletion, severe muscle depletion, percent weight loss (13% weight loss within 3 months) Percent weight loss: 13 % Interventions: Ensure Enlive (each supplement provides 350kcal and 20 grams of protein), MVI   Discharge Diagnoses:  Principal Problem:   NSTEMI (non-ST elevated myocardial infarction) ([place]) Active Problems:   Essential hypertension   Anemia   Colonic mass   Unintended weight loss   Hyperlipidemia   Hyponatremia   Leucocytosis   PAD (peripheral artery disease) (HCC)   S/P femoral-popliteal bypass surgery   H/O aorta-iliac-femoral bypass   Smoker   Protein-calorie malnutrition, severe   Discharge Instructions:  Activity:  As tolerated   Discharge Instructions     AMB Referral to Cardiac Rehabilitation - Phase II   Complete by: As directed    Diagnosis: NSTEMI   After  initial evaluation and assessments completed: Virtual Based Care may be provided alone or in  conjunction with Phase 2 Cardiac Rehab based on patient barriers.: Yes   Call MD for:  difficulty breathing, headache or visual disturbances   Complete by: As directed    Call MD for:  persistant dizziness or light-headedness   Complete by: As directed    Diet - low sodium heart healthy   Complete by: As directed    Discharge instructions   Complete by: As directed    Follow with Primary MD  Glenis Smoker, MD in 1-2 weeks  Please follow-up with general surgery-they will call you with a follow-up appointment  Please get a complete blood count and chemistry panel checked by your Primary MD at your next visit, and again as instructed by your Primary MD.  Get Medicines reviewed and adjusted: Please take all your medications with you for your next visit with your Primary MD  Laboratory/radiological data: Please request your Primary MD to go over all hospital tests and procedure/radiological results at the follow up, please ask your Primary MD to get all Hospital records sent to his/her office.  In some cases, they will be blood work, cultures and biopsy results pending at the time of your discharge. Please request that your primary care M.D. follows up on these results.  Also Note the following: If you experience worsening of your admission symptoms, develop shortness of breath, life threatening emergency, suicidal or homicidal thoughts you must seek medical attention immediately by calling 911 or calling your MD immediately  if symptoms less severe.  You must read complete instructions/literature along with all the possible adverse reactions/side effects for all the Medicines you take and that have been prescribed to you. Take any new Medicines after you have completely understood and accpet all the possible adverse reactions/side effects.   Do not drive when taking Pain medications or sleeping medications (Benzodaizepines)  Do not take more than prescribed Pain, Sleep and Anxiety  Medications. It is not advisable to combine anxiety,sleep and pain medications without talking with your primary care practitioner  Special Instructions: If you have smoked or chewed Tobacco  in the last 2 yrs please stop smoking, stop any regular Alcohol  and or any Recreational drug use.  Wear Seat belts while driving.  Please note: You were cared for by a hospitalist during your hospital stay. Once you are discharged, your primary care physician will handle any further medical issues. Please note that NO REFILLS for any discharge medications will be authorized once you are discharged, as it is imperative that you return to your primary care physician (or establish a relationship with a primary care physician if you do not have one) for your post hospital discharge needs so that they can reassess your need for medications and monitor your lab values.   Increase activity slowly   Complete by: As directed       Allergies as of 09/11/2020   No Known Allergies      Medication List     TAKE these medications    acetaminophen 500 MG tablet Commonly known as: TYLENOL Take 500 mg by mouth every 6 (six) hours as needed for moderate pain.   aspirin EC 81 MG tablet Take 81 mg by mouth daily. Swallow whole.   atorvastatin 80 MG tablet Commonly known as: LIPITOR Take 1 tablet (80 mg total) by mouth every evening. What changed:  medication strength how much to take   feeding supplement  Liqd Take 237 mLs by mouth 3 (three) times daily between meals.   ibuprofen 200 MG tablet Commonly known as: ADVIL Take 200 mg by mouth every 6 (six) hours as needed.   metoprolol tartrate 25 MG tablet Commonly known as: LOPRESSOR Take 0.5 tablets (12.5 mg total) by mouth 2 (two) times daily.   nitroGLYCERIN 0.4 MG SL tablet Commonly known as: NITROSTAT Place 1 tablet (0.4 mg total) under the tongue every 5 (five) minutes x 3 doses as needed for chest pain.   polyethylene glycol 17 g  packet Commonly known as: MIRALAX / GLYCOLAX Take 17 g by mouth daily as needed for moderate constipation.   traZODone 50 MG tablet Commonly known as: DESYREL Take 50 mg by mouth at bedtime.   Vitamin D 50 MCG (2000 UT) Caps Take 1 capsule by mouth at bedtime.        Follow-up Information     Surgery, [place]. Call in 3 week(s).   Specialty: General Surgery Why: We are working on getting you an appointment with one of our colorectal surgeons in the next 3-4 weeks. Please call in 1 week from discharge to confirm this appointment date and time Contact information: [address] [place] [address] [phone]         Glenis Smoker, MD. Schedule an appointment as soon as possible for a visit in 1 week(s).   Specialty: Family Medicine Why: Hospital follow up Contact information: [place] [address] [phone]                No Known Allergies   Other Procedures/Studies: DG Chest 2 View  Result Date: 09/08/2020 CLINICAL DATA:  Chest pain. EXAM: CHEST - 2 VIEW COMPARISON:  CT 08/28/2020.  Chest x-ray 08/27/2020. FINDINGS: Mediastinum hilar structures normal. No focal infiltrate. Previously described pulmonary nodules best identified by prior CT. No pleural effusion or pneumothorax. Heart size normal. No acute bony abnormality. IMPRESSION: No acute cardiopulmonary disease. Electronically Signed   By: Marcello Moores  Register   On: 09/08/2020 13:35   DG Chest 2 View  Result Date: 08/27/2020 CLINICAL DATA:  75 year old female with history of unintentional weight loss. Heart palpitations. EXAM: CHEST - 2 VIEW COMPARISON:  Chest x-ray 07/30/2008. FINDINGS: Lung volumes are normal. Mild diffuse peribronchial cuffing and interstitial prominence. No consolidative airspace disease. No pleural effusions. No pneumothorax. No pulmonary nodule or mass noted. Pulmonary vasculature and the cardiomediastinal silhouette are within normal  limits. Atherosclerosis in the thoracic aorta. IMPRESSION: 1. Mild diffuse peribronchial cuffing and interstitial prominence. This could reflect either acute or chronic bronchitis. 2. Aortic atherosclerosis. Electronically Signed   By: Vinnie Langton M.D.   On: 08/27/2020 13:44   CARDIAC CATHETERIZATION  Result Date: 09/09/2020  Dist Cx lesion is 100% stenosed.  Prox RCA lesion is 50% stenosed.  1.  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  There is also moderate proximal RCA disease. 2.  Left ventricular angiography was not performed.  EF was low normal by echo with posterior wall hypokinesis.  Mildly elevated left ventricular end-diastolic pressure. Recommendations: This is a late presenting posterior myocardial infarction.  The occlusion involves distal left circumflex which has already collateralized.  The patient is chest pain-free and thus no indication for revascularization at this point.  I recommend continuing medical therapy. I discontinued heparin drip. The patient can proceed with endoscopic GI procedures for diagnosis of colon  mass at an overall low to moderate risk.   CT CHEST ABDOMEN PELVIS W CONTRAST  Result Date: 08/29/2020 CLINICAL DATA:  Unintentional weight loss. Patient with stomach issues including gas in indigestion. History of abdominal aortic bypass surgery. EXAM: CT CHEST, ABDOMEN, AND PELVIS WITH CONTRAST TECHNIQUE: Multidetector CT imaging of the chest, abdomen and pelvis was performed following the standard protocol during bolus administration of intravenous contrast. CONTRAST:  41m OMNIPAQUE IOHEXOL 300 MG/ML  SOLN COMPARISON:  CTA, 06/18/2008. FINDINGS: CT CHEST FINDINGS Cardiovascular: Heart normal in size and configuration. No pericardial effusion. Mild three-vessel coronary artery calcifications. Great vessels are normal in caliber. Thoracic aorta without dissection. Thoracic aortic  atherosclerosis most evident along the descending portion. Branch vessels are widely patent. Mediastinum/Nodes: No enlarged mediastinal, hilar, or axillary lymph nodes. Thyroid gland, trachea, and esophagus demonstrate no significant findings. Lungs/Pleura: Minor areas of peripheral reticular opacity consistent with chronic interstitial thickening/scarring. Several tiny peripheral nodules, 3 mm and less. No evidence of pneumonia or pulmonary edema. No pleural effusion or pneumothorax. Musculoskeletal: No fracture or acute finding.  No bone lesion. CT ABDOMEN PELVIS FINDINGS Hepatobiliary: No focal liver abnormality is seen. No gallstones, gallbladder wall thickening, or biliary dilatation. Pancreas: Unremarkable. No pancreatic ductal dilatation or surrounding inflammatory changes. Spleen: Normal in size without focal abnormality. Adrenals/Urinary Tract: No adrenal masses. Kidneys normal size, orientation and position with symmetric enhancement and excretion. No mass, stone or hydronephrosis. Normal ureters. Normal bladder. Stomach/Bowel: Circumferential mass of the ascending colon beginning just above the ileocecal valve, 7.5 cm in length, wall thickness up to 1.7 cm. Minor inflammatory type stranding is seen in the fat posterior to this mass. Remainder of the colon is normal in caliber. No other areas of wall thickening or mass. No other inflammation. Normal appendix not discretely seen. No findings to suggest acute appendicitis. Normal stomach. Small bowel is normal in caliber. No wall thickening or inflammation. Vascular/Lymphatic: Aortic atherosclerosis. Aortic bypass beginning at the infrarenal portion with widely patent common iliac limbs. Significant atherosclerosis noted of the native external iliac and internal iliac arteries. There is a thrombosed by femoral artery graft crossing the low anterior abdomen. No enlarged lymph nodes. Reproductive: Uterus and bilateral adnexa are unremarkable. Other: No  abdominal wall hernia or abnormality. No abdominopelvic ascites. Musculoskeletal: No fracture or acute finding. No osteoblastic or osteolytic lesions. IMPRESSION: 1. 7.5 cm long circumferential mass of the lower ascending colon just above the ileocecal valve consistent with malignancy. 2. No evidence of metastatic disease in the abdomen or pelvis. 3. There are scattered tiny lung nodules are most likely areas of scarring. Metastatic lesions are not excluded, but felt unlikely. Close attention on follow-up imaging recommended. 4. No acute abnormalities within the chest, abdomen or pelvis. 5. Aortic atherosclerosis. No aneurysm or dissection. Bypass graft from the aorta to the iliac arteries is widely patent. Thrombosed by femoral artery bypass graft. Electronically Signed   By: DLajean ManesM.D.   On: 08/29/2020 16:00   ECHOCARDIOGRAM COMPLETE  Result Date: 09/09/2020    ECHOCARDIOGRAM REPORT   Patient Name:   TPATTIJO JUSTEDate of Exam: 09/09/2020 Medical Rec #:  [id number]   Height:       63.0 in Accession #:    [id number]  Weight:       104.4 lb Date of Birth:  [birth year]   BSA:          1.467 m Patient Age:    89years     BP:  126/78 mmHg Patient Gender: F            HR:           84 bpm. Exam Location:  Inpatient Procedure: 2D Echo, Cardiac Doppler, Color Doppler and Intracardiac            Opacification Agent Indications:    NSTEMI I21.4  History:        Patient has prior history of Echocardiogram examinations, most                 recent 06/20/2008. Risk Factors:Dyslipidemia. PAD.  Sonographer:    Vickie Epley RDCS Referring Phys: [id number] [facility] BHAGAT IMPRESSIONS  1. Left ventricular ejection fraction, by estimation, is 50 to 55%. The left ventricle has low normal function. The left ventricle demonstrates regional wall motion abnormalities (see scoring diagram/findings for description). Left ventricular diastolic  parameters are indeterminate. Elevated left ventricular end-diastolic pressure.  There is hypokinesis of the left ventricular, mid-apical inferior wall, inferolateral wall and anterolateral wall. There is akinesis of the left ventricular, apical inferior  wall.  2. Right ventricular systolic function is normal. The right ventricular size is normal. Tricuspid regurgitation signal is inadequate for assessing PA pressure.  3. The mitral valve is normal in structure. Mild mitral valve regurgitation. No evidence of mitral stenosis.  4. The aortic valve is tricuspid. There is mild calcification of the aortic valve. There is mild thickening of the aortic valve. Aortic valve regurgitation is not visualized. Mild aortic valve sclerosis is present, with no evidence of aortic valve stenosis.  5. The inferior vena cava is normal in size with greater than 50% respiratory variability, suggesting right atrial pressure of 3 mmHg. Comparison(s): Prior images unable to be directly viewed, comparison made by report only. Conclusion(s)/Recommendation(s): Wall motion abnormalities in the lateral and inferior wall. Results communicated to primary team, cardiology team also aware. FINDINGS  Left Ventricle: LV thrombus excluded by contrast. Left ventricular ejection fraction, by estimation, is 50 to 55%. The left ventricle has low normal function. The left ventricle demonstrates regional wall motion abnormalities. Definity contrast agent was given IV to delineate the left ventricular endocardial borders. The left ventricular internal cavity size was normal in size. There is no left ventricular hypertrophy. Left ventricular diastolic parameters are indeterminate. Elevated left ventricular  end-diastolic pressure. Right Ventricle: The right ventricular size is normal. No increase in right ventricular wall thickness. Right ventricular systolic function is normal. Tricuspid regurgitation signal is inadequate for assessing PA pressure. Left Atrium: Left atrial size was normal in size. Right Atrium: Right atrial size was  normal in size. Pericardium: There is no evidence of pericardial effusion. Mitral Valve: The mitral valve is normal in structure. Mild mitral valve regurgitation. No evidence of mitral valve stenosis. Tricuspid Valve: The tricuspid valve is normal in structure. Tricuspid valve regurgitation is trivial. No evidence of tricuspid stenosis. Aortic Valve: The aortic valve is tricuspid. There is mild calcification of the aortic valve. There is mild thickening of the aortic valve. Aortic valve regurgitation is not visualized. Mild aortic valve sclerosis is present, with no evidence of aortic valve stenosis. Pulmonic Valve: The pulmonic valve was not well visualized. Pulmonic valve regurgitation is not visualized. No evidence of pulmonic stenosis. Aorta: The aortic root, ascending aorta, aortic arch and descending aorta are all structurally normal, with no evidence of dilitation or obstruction. Venous: The inferior vena cava is normal in size with greater than 50% respiratory variability, suggesting right atrial pressure of 3 mmHg. IAS/Shunts: The  atrial septum is grossly normal.  LEFT VENTRICLE PLAX 2D LVIDd:         3.60 cm     Diastology LVIDs:         2.60 cm     LV e' medial:    4.33 cm/s LV PW:         0.80 cm     LV E/e' medial:  20.2 LV IVS:        0.80 cm     LV e' lateral:   6.25 cm/s LVOT diam:     1.80 cm     LV E/e' lateral: 14.0 LV SV:         47 LV SV Index:   32 LVOT Area:     2.54 cm  LV Volumes (MOD) LV vol d, MOD A2C: 98.6 ml LV vol d, MOD A4C: 67.2 ml LV vol s, MOD A2C: 36.0 ml LV vol s, MOD A4C: 38.1 ml LV SV MOD A2C:     62.6 ml LV SV MOD A4C:     67.2 ml LV SV MOD BP:      46.7 ml RIGHT VENTRICLE TAPSE (M-mode): 1.6 cm LEFT ATRIUM             Index       RIGHT ATRIUM          Index LA diam:        3.10 cm 2.11 cm/m  RA Area:     6.12 cm LA Vol (A2C):   20.0 ml 13.63 ml/m RA Volume:   10.50 ml 7.16 ml/m LA Vol (A4C):   22.5 ml 15.34 ml/m LA Biplane Vol: 22.1 ml 15.07 ml/m  AORTIC VALVE LVOT  Vmax:   79.90 cm/s LVOT Vmean:  62.000 cm/s LVOT VTI:    0.185 m  AORTA Ao Root diam: 2.50 cm MITRAL VALVE MV Area (PHT): 3.31 cm     SHUNTS MV Decel Time: 229 msec     Systemic VTI:  0.18 m MV E velocity: 87.40 cm/s   Systemic Diam: 1.80 cm MV A velocity: 113.00 cm/s MV E/A ratio:  0.77 Buford Dresser MD Electronically signed by Buford Dresser MD Signature Date/Time: 09/09/2020/12:07:01 PM    Final      TODAY-DAY OF DISCHARGE:  Subjective:   Shaima Sardinas today has no headache,no chest abdominal pain,no new weakness tingling or numbness, feels much better wants to go home today.  Objective:   Blood pressure 115/61, pulse 91, temperature 99.6 F (37.6 C), temperature source Oral, resp. rate 16, height 5' 3"  (1.6 m), weight 47.4 kg, SpO2 100 %.  Intake/Output Summary (Last 24 hours) at 09/11/2020 1057 Last data filed at 09/10/2020 2227 Gross per 24 hour  Intake 503 ml  Output --  Net 503 ml   Filed Weights   09/08/20 1502 09/08/20 2108 09/10/20 0919  Weight: 48.1 kg 47.4 kg 47.4 kg    Exam: Awake Alert, Oriented *3, No new F.N deficits, Normal affect [place].AT,PERRAL Supple Neck,No JVD, No cervical lymphadenopathy appriciated.  Symmetrical Chest wall movement, Good air movement bilaterally, CTAB RRR,No Gallops,Rubs or new Murmurs, No Parasternal Heave +ve B.Sounds, Abd Soft, Non tender, No organomegaly appriciated, No rebound -guarding or rigidity. No Cyanosis, Clubbing or edema, No new Rash or bruise   PERTINENT RADIOLOGIC STUDIES: CARDIAC CATHETERIZATION  Result Date: 09/09/2020  Dist Cx lesion is 100% stenosed.  Prox RCA lesion is 50% stenosed.  1.  Severe one-vessel coronary artery disease with thrombotic occlusion of the  distal left circumflex with left to left collaterals.  There is also moderate proximal RCA disease. 2.  Left ventricular angiography was not performed.  EF was low normal by echo with posterior wall hypokinesis.  Mildly elevated left ventricular  end-diastolic pressure. Recommendations: This is a late presenting posterior myocardial infarction.  The occlusion involves distal left circumflex which has already collateralized.  The patient is chest pain-free and thus no indication for revascularization at this point.  I recommend continuing medical therapy. I discontinued heparin drip. The patient can proceed with endoscopic GI procedures for diagnosis of colon mass at an overall low to moderate risk.     PERTINENT LAB RESULTS: CBC: Recent Labs    09/10/20 0527 09/11/20 0333  WBC 10.3 10.3  HGB 9.4* 9.5*  HCT 28.3* 29.2*  PLT 422* 428*   CMET CMP     Component Value Date/Time   NA 132 (L) 09/10/2020 0527   K 3.9 09/10/2020 0527   CL 102 09/10/2020 0527   CO2 23 09/10/2020 0527   GLUCOSE 96 09/10/2020 0527   BUN 9 09/10/2020 0527   CREATININE 0.68 09/10/2020 0527   CALCIUM 8.6 (L) 09/10/2020 0527   PROT 5.9 (L) 09/09/2020 0726   ALBUMIN 2.7 (L) 09/09/2020 0726   AST 78 (H) 09/09/2020 0726   ALT 15 09/09/2020 0726   ALKPHOS 64 09/09/2020 0726   BILITOT 0.5 09/09/2020 0726   GFRNONAA >60 09/10/2020 0527   GFRAA  07/31/2008 0420    >60        The eGFR has been calculated using the MDRD equation. This calculation has not been validated in all clinical situations. eGFR's persistently <60 mL/min signify possible Chronic Kidney Disease.    GFR Estimated Creatinine Clearance: 46.9 mL/min (by C-G formula based on SCr of 0.68 mg/dL). No results for input(s): LIPASE, AMYLASE in the last 72 hours. No results for input(s): CKTOTAL, CKMB, CKMBINDEX, TROPONINI in the last 72 hours. Invalid input(s): POCBNP No results for input(s): DDIMER in the last 72 hours. No results for input(s): HGBA1C in the last 72 hours. Recent Labs    09/09/20 0726  CHOL 122  HDL 45  LDLCALC 60  TRIG 86  CHOLHDL 2.7   Recent Labs    09/09/20 1310  TSH 1.431   No results for input(s): VITAMINB12, FOLATE, FERRITIN, TIBC, IRON, RETICCTPCT  in the last 72 hours. Coags: No results for input(s): INR in the last 72 hours.  Invalid input(s): PT Microbiology: Recent Results (from the past 240 hour(s))  Resp Panel by RT-PCR (Flu A&B, Covid) Nasopharyngeal Swab     Status: None   Collection Time: 09/08/20  3:10 PM   Specimen: Nasopharyngeal Swab; Nasopharyngeal(NP) swabs in vial transport medium  Result Value Ref Range Status   SARS Coronavirus 2 by RT PCR NEGATIVE NEGATIVE Final    Comment: (NOTE) SARS-CoV-2 target nucleic acids are NOT DETECTED.  The SARS-CoV-2 RNA is generally detectable in upper respiratory specimens during the acute phase of infection. The lowest concentration of SARS-CoV-2 viral copies this assay can detect is 138 copies/mL. A negative result does not preclude SARS-Cov-2 infection and should not be used as the sole basis for treatment or other patient management decisions. A negative result may occur with  improper specimen collection/handling, submission of specimen other than nasopharyngeal swab, presence of viral mutation(s) within the areas targeted by this assay, and inadequate number of viral copies(<138 copies/mL). A negative result must be combined with clinical observations, patient history, and epidemiological information.  The expected result is Negative.  Fact Sheet for Patients:  EntrepreneurPulse.com.au  Fact Sheet for Healthcare Providers:  IncredibleEmployment.be  This test is no t yet approved or cleared by the [place] FDA and  has been authorized for detection and/or diagnosis of SARS-CoV-2 by FDA under an Emergency Use Authorization (EUA). This EUA will remain  in effect (meaning this test can be used) for the duration of the COVID-19 declaration under Section 564(b)(1) of the Act, 21 U.S.C.section 360bbb-3(b)(1), unless the authorization is terminated  or revoked sooner.       Influenza A by PCR NEGATIVE NEGATIVE Final   Influenza B  by PCR NEGATIVE NEGATIVE Final    Comment: (NOTE) The Xpert Xpress SARS-CoV-2/FLU/RSV plus assay is intended as an aid in the diagnosis of influenza from Nasopharyngeal swab specimens and should not be used as a sole basis for treatment. Nasal washings and aspirates are unacceptable for Xpert Xpress SARS-CoV-2/FLU/RSV testing.  Fact Sheet for Patients: EntrepreneurPulse.com.au  Fact Sheet for Healthcare Providers: IncredibleEmployment.be  This test is not yet approved or cleared by the [place] FDA and has been authorized for detection and/or diagnosis of SARS-CoV-2 by FDA under an Emergency Use Authorization (EUA). This EUA will remain in effect (meaning this test can be used) for the duration of the COVID-19 declaration under Section 564(b)(1) of the Act, 21 U.S.C. section 360bbb-3(b)(1), unless the authorization is terminated or revoked.  Performed at [facility], [place] [address], [place], [place] [address]     FURTHER DISCHARGE INSTRUCTIONS:  Get Medicines reviewed and adjusted: Please take all your medications with you for your next visit with your Primary MD  Laboratory/radiological data: Please request your Primary MD to go over all hospital tests and procedure/radiological results at the follow up, please ask your Primary MD to get all Hospital records sent to his/her office.  In some cases, they will be blood work, cultures and biopsy results pending at the time of your discharge. Please request that your primary care M.D. goes through all the records of your hospital data and follows up on these results.  Also Note the following: If you experience worsening of your admission symptoms, develop shortness of breath, life threatening emergency, suicidal or homicidal thoughts you must seek medical attention immediately by calling 911 or calling your MD immediately  if symptoms less severe.  You must read complete  instructions/literature along with all the possible adverse reactions/side effects for all the Medicines you take and that have been prescribed to you. Take any new Medicines after you have completely understood and accpet all the possible adverse reactions/side effects.   Do not drive when taking Pain medications or sleeping medications (Benzodaizepines)  Do not take more than prescribed Pain, Sleep and Anxiety Medications. It is not advisable to combine anxiety,sleep and pain medications without talking with your primary care practitioner  Special Instructions: If you have smoked or chewed Tobacco  in the last 2 yrs please stop smoking, stop any regular Alcohol  and or any Recreational drug use.  Wear Seat belts while driving.  Please note: You were cared for by a hospitalist during your hospital stay. Once you are discharged, your primary care physician will handle any further medical issues. Please note that NO REFILLS for any discharge medications will be authorized once you are discharged, as it is imperative that you return to your primary care physician (or establish a relationship with a primary care physician if you do not have one) for  your post hospital discharge needs so that they can reassess your need for medications and monitor your lab values.  Total Time spent coordinating discharge including counseling, education and face to face time equals 35 minutes.  SignedOren Binet 09/11/2020 10:57 AM

## 2020-09-11 NOTE — Progress Notes (Signed)
CARDIAC REHAB PHASE I   PRE:  Rate/Rhythm: 97 SR EOB -->129 ST min activity    BP: sitting 101/63    SaO2: 97 RA  MODE:  Ambulation: 280 ft   POST:  Rate/Rhythm: 125 ST    BP: sitting 121/67     SaO2: 97 RA  Pts HR very sensitive to minimal activity. Up to 129 ST in room. C/o lightheadedness with drinking water, had her rest for several minutes, HR back to 90 SR. Ambulated hall at very slow pace, HR gradually up to 125 ST. Rest x1 for lightheadedness. Pt generally feels weak and "wobbly". Denied CP or SOB.  Discussed MI, restrictions, walking as tolerated, NTG, and CRPII. Pt receptive. Did not give diet but encouraged protein and good nutrition. Will refer to [place] however pt is not interested at this time. [place], ACSM 09/11/2020 8:53 AM

## 2020-09-11 NOTE — Progress Notes (Signed)
Progress Note  1 Day Post-Op  Subjective: CC: feeling well. Tolerating soft diet. No nausea or emesis. She denies abdominal pain   Objective: Vital signs in last 24 hours: Temp:  [98.6 F (37 C)-99.6 F (37.6 C)] 99.6 F (37.6 C) (07/08 0517) Pulse Rate:  [72-91] 91 (07/08 0935) Resp:  [16-19] 16 (07/08 0517) BP: (115-139)/(52-89) 115/61 (07/08 0935) SpO2:  [97 %-100 %] 100 % (07/08 0517) Last BM Date: 09/09/20  Intake/Output from previous day: 07/07 0701 - 07/08 0700 In: 503 [I.V.:503] Out: -  Intake/Output this shift: No intake/output data recorded.  PE: General: pleasant, WD, female who is sitting up in bed in NAD HEENT: head is normocephalic, atraumatic. Mouth is pink and moist Heart:  Palpable radial pulses bilaterally Lungs: Respiratory effort nonlabored Abd: soft, ND, mild TTP RLQ MS: all 4 extremities are symmetrical with no cyanosis, clubbing, or edema. Skin: warm and dry with no masses, lesions, or rashes Psych: A&Ox3 with an appropriate affect.    Lab Results:  Recent Labs    09/10/20 0527 09/11/20 0333  WBC 10.3 10.3  HGB 9.4* 9.5*  HCT 28.3* 29.2*  PLT 422* 428*   BMET Recent Labs    09/09/20 0726 09/10/20 0527  NA 129* 132*  K 4.0 3.9  CL 96* 102  CO2 24 23  GLUCOSE 106* 96  BUN 7* 9  CREATININE 0.73 0.68  CALCIUM 8.7* 8.6*   PT/INR No results for input(s): LABPROT, INR in the last 72 hours. CMP     Component Value Date/Time   NA 132 (L) 09/10/2020 0527   K 3.9 09/10/2020 0527   CL 102 09/10/2020 0527   CO2 23 09/10/2020 0527   GLUCOSE 96 09/10/2020 0527   BUN 9 09/10/2020 0527   CREATININE 0.68 09/10/2020 0527   CALCIUM 8.6 (L) 09/10/2020 0527   PROT 5.9 (L) 09/09/2020 0726   ALBUMIN 2.7 (L) 09/09/2020 0726   AST 78 (H) 09/09/2020 0726   ALT 15 09/09/2020 0726   ALKPHOS 64 09/09/2020 0726   BILITOT 0.5 09/09/2020 0726   GFRNONAA >60 09/10/2020 0527   GFRAA  07/31/2008 0420    >60        The eGFR has been  calculated using the MDRD equation. This calculation has not been validated in all clinical situations. eGFR's persistently <60 mL/min signify possible Chronic Kidney Disease.   Lipase  No results found for: LIPASE     Studies/Results: CARDIAC CATHETERIZATION  Result Date: 09/09/2020  Dist Cx lesion is 100% stenosed.  Prox RCA lesion is 50% stenosed.  1.  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  There is also moderate proximal RCA disease. 2.  Left ventricular angiography was not performed.  EF was low normal by echo with posterior wall hypokinesis.  Mildly elevated left ventricular end-diastolic pressure. Recommendations: This is a late presenting posterior myocardial infarction.  The occlusion involves distal left circumflex which has already collateralized.  The patient is chest pain-free and thus no indication for revascularization at this point.  I recommend continuing medical therapy. I discontinued heparin drip. The patient can proceed with endoscopic GI procedures for diagnosis of colon mass at an overall low to moderate risk.    Anti-infectives: Anti-infectives (From admission, onward)    None        Assessment/Plan  Ascending colon mass - Nonobstructive. Underwent colonoscopy by Dr. Watt Climes on 7/7 - biopsy pending - CT chest  with "scattered tiny lung nodules - most likely areas of scarring. Metastatic lesions are not excluded, but felt unlikely" - CEA 2.8 - no current obstruction or bleeding - plan for presentation at tumor boards and I am working on scheduling her follow up appointment with one of our colorectal surgeons.  Stable for discharge from surgical perspective   FEN: soft ID: none VTE: Lovenox   NSTEMI -Cardiology following.  No plans for revascularization - Echo showed EF of 50-55% - Medical management, ASA, statin, metoprolol   LOS: 3 days    Winferd Humphrey, [facility] 09/11/2020, 9:38 AM Please see Amion for pager number during day hours 7:00am-4:30pm

## 2020-09-11 NOTE — Progress Notes (Signed)
Progress Note  Patient Name: Alejandra Ford Date of Encounter: 09/11/2020  [facility] Cardiologist: Angelena Form (new)  Subjective   No chest pain.   Inpatient Medications    Scheduled Meds:  aspirin EC  81 mg Oral Daily   atorvastatin  80 mg Oral Daily   enoxaparin (LOVENOX) injection  40 mg Subcutaneous Q24H   feeding supplement  237 mL Oral TID BM   metoprolol tartrate  12.5 mg Oral BID   multivitamin with minerals  1 tablet Oral Daily   sodium chloride flush  3 mL Intravenous Q12H   sodium chloride flush  3 mL Intravenous Q12H   traZODone  50 mg Oral QHS   Continuous Infusions:  sodium chloride     lactated ringers Stopped (09/10/20 1144)   PRN Meds: sodium chloride, acetaminophen **OR** acetaminophen, nitroGLYCERIN, ondansetron **OR** ondansetron (ZOFRAN) IV, polyethylene glycol, sodium chloride flush   Vital Signs    Vitals:   09/10/20 1135 09/10/20 1153 09/10/20 2000 09/11/20 0517  BP: 130/62 139/76 124/89 125/67  Pulse: 82 87 89 90  Resp: 19 18 19 16   Temp:  98.7 F (37.1 C) 98.7 F (37.1 C) 99.6 F (37.6 C)  TempSrc:  Oral Oral Oral  SpO2: 98% 97% 100% 100%  Weight:      Height:        Intake/Output Summary (Last 24 hours) at 09/11/2020 0934 Last data filed at 09/10/2020 2227 Gross per 24 hour  Intake 503 ml  Output --  Net 503 ml   Last 3 Weights 09/10/2020 09/08/2020 09/08/2020  Weight (lbs) 104 lb 8 oz 104 lb 6.4 oz 106 lb  Weight (kg) 47.4 kg 47.356 kg 48.081 kg      Telemetry    Sinus- Personally Reviewed  ECG    No new tracing.  Physical Exam   General: Well developed, well nourished, NAD  HEENT: OP clear, mucus membranes moist  SKIN: warm, dry. No rashes. Neuro: No focal deficits  Musculoskeletal: Muscle strength 5/5 all ext  Psychiatric: Mood and affect normal  Neck: No JVD, no carotid bruits, no thyromegaly, no lymphadenopathy.  Lungs:Clear bilaterally, no wheezes, rhonci, crackles Cardiovascular: Regular rate and rhythm. No  murmurs, gallops or rubs. Abdomen:Soft. Bowel sounds present. Non-tender.  Extremities: No lower extremity edema.   Labs    High Sensitivity Troponin:   Recent Labs  Lab 09/08/20 1310 09/08/20 1502 09/08/20 1814 09/08/20 2014 09/08/20 2248  TROPONINIHS 13,980* 13,592* 13,401* 11,720* 12,104*      Chemistry Recent Labs  Lab 09/08/20 1310 09/09/20 0726 09/10/20 0527  NA 129* 129* 132*  K 4.2 4.0 3.9  CL 93* 96* 102  CO2 27 24 23   GLUCOSE 111* 106* 96  BUN 6* 7* 9  CREATININE 0.68 0.73 0.68  CALCIUM 9.4 8.7* 8.6*  PROT  --  5.9*  --   ALBUMIN  --  2.7*  --   AST  --  78*  --   ALT  --  15  --   ALKPHOS  --  64  --   BILITOT  --  0.5  --   GFRNONAA >60 >60 >60  ANIONGAP 9 9 7      Hematology Recent Labs  Lab 09/09/20 0726 09/10/20 0527 09/11/20 0333  WBC 13.4* 10.3 10.3  RBC 4.07 3.49* 3.62*  HGB 10.8* 9.4* 9.5*  HCT 32.6* 28.3* 29.2*  MCV 80.1 81.1 80.7  MCH 26.5 26.9 26.2  MCHC 33.1 33.2 32.5  RDW 15.6* 15.7* 15.4  PLT 502* 422* 428*    BNPNo results for input(s): BNP, PROBNP in the last 168 hours.   DDimer No results for input(s): DDIMER in the last 168 hours.   Radiology    CARDIAC CATHETERIZATION  Result Date: 09/09/2020  Dist Cx lesion is 100% stenosed.  Prox RCA lesion is 50% stenosed.  1.  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  There is also moderate proximal RCA disease. 2.  Left ventricular angiography was not performed.  EF was low normal by echo with posterior wall hypokinesis.  Mildly elevated left ventricular end-diastolic pressure. Recommendations: This is a late presenting posterior myocardial infarction.  The occlusion involves distal left circumflex which has already collateralized.  The patient is chest pain-free and thus no indication for revascularization at this point.  I recommend continuing medical therapy. I discontinued heparin drip. The  patient can proceed with endoscopic GI procedures for diagnosis of colon mass at an overall low to moderate risk.   ECHOCARDIOGRAM COMPLETE  Result Date: 09/09/2020    ECHOCARDIOGRAM REPORT   Patient Name:   Alejandra Ford Date of Exam: 09/09/2020 Medical Rec #:  [id number]    Height:       63.0 in Accession #:    [id number]   Weight:       104.4 lb Date of Birth:  1946-02-13    BSA:          1.467 m Patient Age:    5 years     BP:           126/78 mmHg Patient Gender: F            HR:           84 bpm. Exam Location:  Inpatient Procedure: 2D Echo, Cardiac Doppler, Color Doppler and Intracardiac            Opacification Agent Indications:    NSTEMI I21.4  History:        Patient has prior history of Echocardiogram examinations, most                 recent 06/20/2008. Risk Factors:Dyslipidemia. PAD.  Sonographer:    Vickie Epley RDCS Referring Phys: [id number] [facility]AGAT IMPRESSIONS  1. Left ventricular ejection fraction, by estimation, is 50 to 55%. The left ventricle has low normal function. The left ventricle demonstrates regional wall motion abnormalities (see scoring diagram/findings for description). Left ventricular diastolic  parameters are indeterminate. Elevated left ventricular end-diastolic pressure. There is hypokinesis of the left ventricular, mid-apical inferior wall, inferolateral wall and anterolateral wall. There is akinesis of the left ventricular, apical inferior  wall.  2. Right ventricular systolic function is normal. The right ventricular size is normal. Tricuspid regurgitation signal is inadequate for assessing PA pressure.  3. The mitral valve is normal in structure. Mild mitral valve regurgitation. No evidence of mitral stenosis.  4. The aortic valve is tricuspid. There is mild calcification of the aortic valve. There is mild thickening of the aortic valve. Aortic valve regurgitation is not visualized. Mild aortic valve sclerosis is present, with no evidence of aortic valve stenosis.  5.  The inferior vena cava is normal in size with greater than 50% respiratory variability, suggesting right atrial pressure of 3 mmHg. Comparison(s): Prior images unable to be directly viewed, comparison made by report only. Conclusion(s)/Recommendation(s): Wall motion abnormalities in the lateral and inferior wall. Results communicated to primary team, cardiology team  also aware. FINDINGS  Left Ventricle: LV thrombus excluded by contrast. Left ventricular ejection fraction, by estimation, is 50 to 55%. The left ventricle has low normal function. The left ventricle demonstrates regional wall motion abnormalities. Definity contrast agent was given IV to delineate the left ventricular endocardial borders. The left ventricular internal cavity size was normal in size. There is no left ventricular hypertrophy. Left ventricular diastolic parameters are indeterminate. Elevated left ventricular  end-diastolic pressure. Right Ventricle: The right ventricular size is normal. No increase in right ventricular wall thickness. Right ventricular systolic function is normal. Tricuspid regurgitation signal is inadequate for assessing PA pressure. Left Atrium: Left atrial size was normal in size. Right Atrium: Right atrial size was normal in size. Pericardium: There is no evidence of pericardial effusion. Mitral Valve: The mitral valve is normal in structure. Mild mitral valve regurgitation. No evidence of mitral valve stenosis. Tricuspid Valve: The tricuspid valve is normal in structure. Tricuspid valve regurgitation is trivial. No evidence of tricuspid stenosis. Aortic Valve: The aortic valve is tricuspid. There is mild calcification of the aortic valve. There is mild thickening of the aortic valve. Aortic valve regurgitation is not visualized. Mild aortic valve sclerosis is present, with no evidence of aortic valve stenosis. Pulmonic Valve: The pulmonic valve was not well visualized. Pulmonic valve regurgitation is not visualized. No  evidence of pulmonic stenosis. Aorta: The aortic root, ascending aorta, aortic arch and descending aorta are all structurally normal, with no evidence of dilitation or obstruction. Venous: The inferior vena cava is normal in size with greater than 50% respiratory variability, suggesting right atrial pressure of 3 mmHg. IAS/Shunts: The atrial septum is grossly normal.  LEFT VENTRICLE PLAX 2D LVIDd:         3.60 cm     Diastology LVIDs:         2.60 cm     LV e' medial:    4.33 cm/s LV PW:         0.80 cm     LV E/e' medial:  20.2 LV IVS:        0.80 cm     LV e' lateral:   6.25 cm/s LVOT diam:     1.80 cm     LV E/e' lateral: 14.0 LV SV:         47 LV SV Index:   32 LVOT Area:     2.54 cm  LV Volumes (MOD) LV vol d, MOD A2C: 98.6 ml LV vol d, MOD A4C: 67.2 ml LV vol s, MOD A2C: 36.0 ml LV vol s, MOD A4C: 38.1 ml LV SV MOD A2C:     62.6 ml LV SV MOD A4C:     67.2 ml LV SV MOD BP:      46.7 ml RIGHT VENTRICLE TAPSE (M-mode): 1.6 cm LEFT ATRIUM             Index       RIGHT ATRIUM          Index LA diam:        3.10 cm 2.11 cm/m  RA Area:     6.12 cm LA Vol (A2C):   20.0 ml 13.63 ml/m RA Volume:   10.50 ml 7.16 ml/m LA Vol (A4C):   22.5 ml 15.34 ml/m LA Biplane Vol: 22.1 ml 15.07 ml/m  AORTIC VALVE LVOT Vmax:   79.90 cm/s LVOT Vmean:  62.000 cm/s LVOT VTI:    0.185 m  AORTA Ao Root diam: 2.50 cm MITRAL VALVE  MV Area (PHT): 3.31 cm     SHUNTS MV Decel Time: 229 msec     Systemic VTI:  0.18 m MV E velocity: 87.40 cm/s   Systemic Diam: 1.80 cm MV A velocity: 113.00 cm/s MV E/A ratio:  0.77 Buford Dresser MD Electronically signed by Buford Dresser MD Signature Date/Time: 09/09/2020/12:07:01 PM    Final     Cardiac Studies   Cath: 09/09/20  Dist Cx lesion is 100% stenosed. Prox RCA lesion is 50% stenosed.   1.  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  There is also moderate proximal RCA disease. 2.  Left  ventricular angiography was not performed.  EF was low normal by echo with posterior wall hypokinesis.  Mildly elevated left ventricular end-diastolic pressure.   Recommendations: This is a late presenting posterior myocardial infarction.  The occlusion involves distal left circumflex which has already collateralized.  The patient is chest pain-free and thus no indication for revascularization at this point.  I recommend continuing medical therapy. I discontinued heparin drip. The patient can proceed with endoscopic GI procedures for diagnosis of colon mass at an overall low to moderate risk.  Diagnostic Dominance: Right   Echo: 09/09/20  IMPRESSIONS     1. Left ventricular ejection fraction, by estimation, is 50 to 55%. The  left ventricle has low normal function. The left ventricle demonstrates  regional wall motion abnormalities (see scoring diagram/findings for  description). Left ventricular diastolic   parameters are indeterminate. Elevated left ventricular end-diastolic  pressure. There is hypokinesis of the left ventricular, mid-apical  inferior wall, inferolateral wall and anterolateral wall. There is  akinesis of the left ventricular, apical inferior   wall.   2. Right ventricular systolic function is normal. The right ventricular  size is normal. Tricuspid regurgitation signal is inadequate for assessing  PA pressure.   3. The mitral valve is normal in structure. Mild mitral valve  regurgitation. No evidence of mitral stenosis.   4. The aortic valve is tricuspid. There is mild calcification of the  aortic valve. There is mild thickening of the aortic valve. Aortic valve  regurgitation is not visualized. Mild aortic valve sclerosis is present,  with no evidence of aortic valve  stenosis.   5. The inferior vena cava is normal in size with greater than 50%  respiratory variability, suggesting right atrial pressure of 3 mmHg.   Comparison(s): Prior images unable to be  directly viewed, comparison made  by report only.   Conclusion(s)/Recommendation(s): Wall motion abnormalities in the lateral  and inferior wall. Results communicated to primary team, cardiology team  also aware.   Patient Profile     75 y.o. female with PAD (follow by Dr. Donnetta Hutching), tobacco abuse, HLD, and recent CT abd with colon mass who was seen 09/08/2020 for the evaluation of chest pain and elevated troponin consistent with NSTEMI. Cardiac cath with occlusion of the Circumflex artery which was filling from left to left collaterals.   Assessment & Plan    NSTEMI: High-sensitivity troponin peaked at 13,980.  EKG showed T wave inversions in lateral leads. Cardiac cath with occlusion of the Circumflex artery which was filling from left to left collaterals.  No good options for PCI. The Circumflex is filling briskly from left to left collaterals.  Echo showed EFof 50-55% with inferolateral hypokinesis.  Continue ASA, statin and beta blocker.   Colon Mass: Noted on CT scan with concern for malignancy of  7.5 cm of the lower ascending colon.  Followed by GI and surgery.    PAD: Status post left to right fem/fem bypass grafting in 2009.  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 Donnetta Hutching   HLD: LDL 60 -- on statin   OK to discharge home today from cardiac perspective.   For questions or updates, please contact [place] Please consult [email or web address] for contact info under        Signed, Lauree Chandler, MD  09/11/2020, 9:34 AM

## 2020-09-11 NOTE — Progress Notes (Signed)
Spoke with surgery PA-C Martha-surgery will arrange for outpatient follow-up-and colectomy.  Cardiology has cleared patient for discharge.  Biopsy still pending.  Stable for discharge from my point of view-see discharge summary for details

## 2020-09-14 LAB — SURGICAL PATHOLOGY

## 2020-09-17 ENCOUNTER — Telehealth (HOSPITAL_COMMUNITY): Payer: Self-pay

## 2020-09-17 NOTE — Telephone Encounter (Signed)
Per the cardiac rehab phase I team, don't call pt for cardiac rehab. Canceled referral

## 2020-10-05 ENCOUNTER — Ambulatory Visit (HOSPITAL_BASED_OUTPATIENT_CLINIC_OR_DEPARTMENT_OTHER): Payer: Medicare Other | Admitting: Cardiology

## 2020-10-05 ENCOUNTER — Other Ambulatory Visit: Payer: Self-pay

## 2020-10-05 ENCOUNTER — Encounter (HOSPITAL_BASED_OUTPATIENT_CLINIC_OR_DEPARTMENT_OTHER): Payer: Self-pay | Admitting: Cardiology

## 2020-10-05 VITALS — BP 126/68 | HR 78 | Ht 63.0 in | Wt 101.0 lb

## 2020-10-05 DIAGNOSIS — F17210 Nicotine dependence, cigarettes, uncomplicated: Secondary | ICD-10-CM | POA: Diagnosis not present

## 2020-10-05 DIAGNOSIS — I2510 Atherosclerotic heart disease of native coronary artery without angina pectoris: Secondary | ICD-10-CM

## 2020-10-05 DIAGNOSIS — E7800 Pure hypercholesterolemia, unspecified: Secondary | ICD-10-CM | POA: Diagnosis not present

## 2020-10-05 DIAGNOSIS — I739 Peripheral vascular disease, unspecified: Secondary | ICD-10-CM | POA: Diagnosis not present

## 2020-10-05 DIAGNOSIS — Z716 Tobacco abuse counseling: Secondary | ICD-10-CM | POA: Diagnosis not present

## 2020-10-05 DIAGNOSIS — I252 Old myocardial infarction: Secondary | ICD-10-CM

## 2020-10-05 NOTE — Progress Notes (Signed)
Cardiology Office Note:    Date:  10/05/2020   ID:  Alejandra Ford, DOB 12/19/1945, MRN [id number]  PCP:  Glenis Smoker, MD  Cardiologist:  Buford Dresser, MD  Referring MD: Glenis Smoker, *   CC: hospital follow up  History of Present Illness:    Alejandra Ford is a 75 y.o. female with a hx of hyperlipidemia, PAD, and tobacco abuse, recent NSTEMI who is seen for post hospital follow up. This appt had been scheduled as a new consult at the request of Glenis Smoker, * for the evaluation and management of palpitations, but in the interim she was admitted to the hospital and treated for NSTEMI.  Today: This visit was scheduled initially to discuss palpitations, but in the interim she presented to the hospital and was treated as an NSTEMI, see below.  Tachycardia/palpitations: -Initial onset: Since COVID infection in 12/2019 -Frequency/Duration: 2-3 times a day for a few seconds -Associated symptoms: A hard, fast beat -Tobacco: Currently smokes 2-3 cigarettes per day, is trying to quit but finds she is wanting to smoke more since her ED visit -OTC supplements: None, notes having strange reactions to vitamins and supplements -Exercise level: Inactive, needs to rest after minimal activity (laundry, rearrange dishwasher, straighten up rooms, dusting). She will feel tired or start having palpitations. Notes that lifting a gallon of fluid is "pushing it." -Cardiac ROS: no chest pain, +shortness of breath, no PND, no orthopnea, no LE edema.  She was recently in the hospital 09/2020, found to have NSTEMI that was managed medically. While in the hospital she had a few episodes of palpitations.   Prior to her hospital visit, she felt different, and her chest felt "fuller" for about 2 days. We reviewed her hospital testing and workup together today.  Of note, she underwent endoscopy and had a biopsy of ascending colon mass. 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 She has follow up this week to discuss next steps.  She is still having palpitations but not as severely as before. An episode will consist of a hard, fast beat, and after a few seconds it slows back down. Now it occurs 2-3 times a day, for a duration of 2-3 beats ("a jump and a skip"). Was worse previously, and sometimes she needed to lie down. Most of her palpitations seem to occur with exertion.  Lately she has been suffering from severe insomnia. At one time she did not sleep for four nights in a row. She has tried taking trazodone but this caused her to have nightmares.  In October 2021 she was infected with COVID.   She denies any chest pain, headaches, lightheadedness, or syncope. Also has no lower extremity edema, orthopnea or PND. She denies any bleeding issues.   Past Medical History:  Diagnosis Date   HLD (hyperlipidemia)    PAD (peripheral artery disease) ([place])    Tobacco abuse     Past Surgical History:  Procedure Laterality Date   BIOPSY  09/10/2020   Procedure: BIOPSY;  Surgeon: Clarene Essex, MD;  Location: [facility];  Service: Endoscopy;;   COLONOSCOPY N/A 09/10/2020   Procedure: COLONOSCOPY;  Surgeon: Clarene Essex, MD;  Location: [facility] ENDOSCOPY;  Service: Endoscopy;  Laterality: N/A;   LEFT HEART CATH AND CORONARY ANGIOGRAPHY N/A 09/09/2020   Procedure: LEFT HEART CATH AND CORONARY ANGIOGRAPHY;  Surgeon: Wellington Hampshire, MD;  Location: [facility];  Service: Cardiovascular;  Laterality: N/A;   SUBMUCOSAL TATTOO INJECTION  09/10/2020   Procedure: SUBMUCOSAL TATTOO INJECTION;  Surgeon: Clarene Essex, MD;  Location: [facility] ENDOSCOPY;  Service: Endoscopy;;    Current Medications: Current Outpatient Medications on File Prior to Visit  Medication Sig   acetaminophen (TYLENOL) 500 MG tablet Take 500 mg by mouth every 6 (six) hours as needed for moderate pain.   aspirin EC 81 MG tablet Take 81 mg by mouth daily. Swallow whole.   atorvastatin (LIPITOR) 80 MG tablet  Take 1 tablet (80 mg total) by mouth every evening.   Cholecalciferol (VITAMIN D) 50 MCG (2000 UT) CAPS Take 1 capsule by mouth at bedtime.   feeding supplement (ENSURE ENLIVE / ENSURE PLUS) LIQD Take 237 mLs by mouth 3 (three) times daily between meals.   ibuprofen (ADVIL) 200 MG tablet Take 200 mg by mouth every 6 (six) hours as needed.   metoprolol tartrate (LOPRESSOR) 25 MG tablet Take 0.5 tablets (12.5 mg total) by mouth 2 (two) times daily.   nitroGLYCERIN (NITROSTAT) 0.4 MG SL tablet Place 1 tablet (0.4 mg total) under the tongue every 5 (five) minutes x 3 doses as needed for chest pain.   polyethylene glycol (MIRALAX / GLYCOLAX) 17 g packet Take 17 g by mouth daily as needed for moderate constipation.   No current facility-administered medications on file prior to visit.     Allergies:   Patient has no known allergies.   Social History   Tobacco Use   Smoking status: Former    Types: Cigarettes   Smokeless tobacco: Former    Family History: No family history of CAD.  ROS:   Please see the history of present illness.  Additional pertinent ROS: Constitutional: Positive for fatigue, insomnia. Negative for chills, fever, night sweats.  HENT: Negative for ear pain and hearing loss.   Eyes: Negative for loss of vision and eye pain.  Respiratory: Negative for cough, sputum, wheezing.   Cardiovascular: See HPI. Gastrointestinal: Negative for abdominal pain, melena, and hematochezia.  Genitourinary: Negative for dysuria and hematuria.  Musculoskeletal: Negative for falls and myalgias.  Skin: Negative for itching and rash.  Neurological: Negative for focal weakness, focal sensory changes and loss of consciousness.  Endo/Heme/Allergies: Does not bruise/bleed easily.     EKGs/Labs/Other Studies Reviewed:    The following studies were reviewed today:  LHC 09/09/2020: Dist Cx lesion is 100% stenosed. Prox RCA lesion is 50% stenosed.   1.  Severe one-vessel coronary artery  disease with thrombotic occlusion of the distal left circumflex with left to left collaterals.  There is also moderate proximal RCA disease. 2.  Left ventricular angiography was not performed.  EF was low normal by echo with posterior wall hypokinesis.  Mildly elevated left ventricular end-diastolic pressure.   Recommendations: This is a late presenting posterior myocardial infarction.  The occlusion involves distal left circumflex which has already collateralized.  The patient is chest pain-free and thus no indication for revascularization at this point.  I recommend continuing medical therapy. I discontinued heparin drip. The patient can proceed with endoscopic GI procedures for diagnosis of colon mass at an overall low to moderate risk.  Echo 09/09/2020:  1. Left ventricular ejection fraction, by estimation, is 50 to 55%. The  left ventricle has low normal function. The left ventricle demonstrates  regional wall motion abnormalities (see scoring diagram/findings for  description). Left ventricular diastolic   parameters are indeterminate. Elevated left ventricular end-diastolic  pressure. There is hypokinesis of the left ventricular, mid-apical  inferior wall, inferolateral wall and anterolateral wall.  There is  akinesis of the left ventricular, apical inferior   wall.   2. Right ventricular systolic function is normal. The right ventricular  size is normal. Tricuspid regurgitation signal is inadequate for assessing  PA pressure.   3. The mitral valve is normal in structure. Mild mitral valve  regurgitation. No evidence of mitral stenosis.   4. The aortic valve is tricuspid. There is mild calcification of the  aortic valve. There is mild thickening of the aortic valve. Aortic valve  regurgitation is not visualized. Mild aortic valve sclerosis is present,  with no evidence of aortic valve  stenosis.   5. The inferior vena cava is normal in size with greater than 50%  respiratory  variability, suggesting right atrial pressure of 3 mmHg.   Comparison(s): Prior images unable to be directly viewed, comparison made  by report only.   Conclusion(s)/Recommendation(s): Wall motion abnormalities in the lateral  and inferior wall. Results communicated to primary team, cardiology team  also aware.   EKG:  EKG is personally reviewed.   10/05/2020: NSR with TWI in inferiorlateral leads  Recent Labs: 09/09/2020: ALT 15; TSH 1.431 09/10/2020: BUN 9; Creatinine, Ser 0.68; Potassium 3.9; Sodium 132 09/11/2020: Hemoglobin 9.5; Platelets 428  Recent Lipid Panel    Component Value Date/Time   CHOL 122 09/09/2020 0726   TRIG 86 09/09/2020 0726   HDL 45 09/09/2020 0726   CHOLHDL 2.7 09/09/2020 0726   VLDL 17 09/09/2020 0726   LDLCALC 60 09/09/2020 0726    Physical Exam:    VS:  BP 126/68   Pulse 78   Ht '5\' 3"'$  (1.6 m)   Wt 101 lb (45.8 kg)   BMI 17.89 kg/m     Wt Readings from Last 3 Encounters:  10/05/20 101 lb (45.8 kg)  09/10/20 104 lb 8 oz (47.4 kg)    GEN: Thin, well developed in no acute distress HEENT: Normal, moist mucous membranes NECK: No JVD CARDIAC: regular rhythm, normal S1 and S2, no rubs or gallops. No murmur. VASCULAR: Radial and DP pulses 2+ bilaterally. No carotid bruits RESPIRATORY:  Clear to auscultation without rales, wheezing or rhonchi  ABDOMEN: Soft, non-tender, non-distended MUSCULOSKELETAL:  Ambulates independently SKIN: Warm and dry, no edema NEUROLOGIC:  Alert and oriented x 3. No focal neuro deficits noted. PSYCHIATRIC:  Normal affect    ASSESSMENT:    1. History of non-ST elevation myocardial infarction (NSTEMI)   2. Coronary artery disease involving native coronary artery of native heart without angina pectoris   3. PAD (peripheral artery disease) ([place])   4. Pure hypercholesterolemia   5. Tobacco abuse counseling    PLAN:    Coronary disease, without angina History of recent NSTEMI History of PAD Pure hypercholesterolemia, LDL  goal <70 -now chest pain free, reviewed her anginal equivalent -reviewed echo, cath results -tolerating aspirin, atorvastatin, metoprolol -biggest unknown is next step and prognosis with her malignancy. Given recent cath, would not perform additional testing prior to surgery if needed; would continue beta blocker perioperatively and aspirin if possible given recent NSTEMI -she is hesitant about starting cardiac rehab. I think it is reasonable to wait until we have more of an understanding regarding plans for her malignancy before starting a cardiac rehab program  Tobacco abuse counseling: The patient was counseled on tobacco cessation today for 4 minutes.  Counseling included reviewing the risks of smoking tobacco products, how it impacts the patient's current medical diagnoses and different strategies for quitting.  Pharmacotherapy to aid in tobacco  cessation was not prescribed today. She quit for a time but restarted after hospital discharge. Encouraged complete cessation as her goal.  Cardiac risk counseling and prevention recommendations: -recommend heart healthy/Mediterranean diet, with whole grains, fruits, vegetable, fish, lean meats, nuts, and olive oil. Limit salt. -recommend moderate walking, 3-5 times/week for 30-50 minutes each session. Aim for at least 150 minutes.week. Goal should be pace of 3 miles/hours, or walking 1.5 miles in 30 minutes -recommend avoidance of tobacco products. Avoid excess alcohol. -ASCVD risk score: The ASCVD Risk score Mikey Bussing DC Jr., et al., 2013) failed to calculate for the following reasons:   The patient has a prior MI or stroke diagnosis    Plan for follow up: 3 months or sooner as needed.  Buford Dresser, MD, PhD, [facility]    Medication Adjustments/Labs and Tests Ordered: Current medicines are reviewed at length with the patient today.  Concerns regarding medicines are outlined above.  Orders Placed This Encounter   Procedures   EKG 12-Lead   No orders of the defined types were placed in this encounter.  Patient Instructions  Medication Instructions:  Your Physician recommend you continue on your current medication as directed.    *If you need a refill on your cardiac medications before your next appointment, please call your pharmacy*   Lab Work: None ordered today   Testing/Procedures: None ordered today   Follow-Up: At [facility], you and your health needs are our priority.  As part of our continuing mission to provide you with exceptional heart care, we have created designated Provider Care Teams.  These Care Teams include your primary Cardiologist (physician) and Advanced Practice Providers (APPs -  Physician Assistants and Nurse Practitioners) who all work together to provide you with the care you need, when you need it.  We recommend signing up for the patient portal called "MyChart".  Sign up information is provided on this After Visit Summary.  MyChart is used to connect with patients for Virtual Visits (Telemedicine).  Patients are able to view lab/test results, encounter notes, upcoming appointments, etc.  Non-urgent messages can be sent to your provider as well.   To learn more about what you can do with MyChart, go to [email or web address].    Your next appointment:   3 month(s)  The format for your next appointment:   In Person  Provider:   Dr. Randall An [place] as a scribe for Buford Dresser, MD.,have documented all relevant documentation on the behalf of Buford Dresser, MD,as directed by  Buford Dresser, MD while in the presence of Buford Dresser, MD.  I, Buford Dresser, MD, have reviewed all documentation for this visit. The documentation on 10/05/20 for the exam, diagnosis, procedures, and orders are all accurate and complete.   Signed, Buford Dresser, MD PhD 10/05/2020 6:28 PM    [place]

## 2020-10-05 NOTE — Patient Instructions (Signed)
Medication Instructions:  Your Physician recommend you continue on your current medication as directed.    *If you need a refill on your cardiac medications before your next appointment, please call your pharmacy*   Lab Work: None ordered today   Testing/Procedures: None ordered today   Follow-Up: At [facility], you and your health needs are our priority.  As part of our continuing mission to provide you with exceptional heart care, we have created designated Provider Care Teams.  These Care Teams include your primary Cardiologist (physician) and Advanced Practice Providers (APPs -  Physician Assistants and Nurse Practitioners) who all work together to provide you with the care you need, when you need it.  We recommend signing up for the patient portal called "MyChart".  Sign up information is provided on this After Visit Summary.  MyChart is used to connect with patients for Virtual Visits (Telemedicine).  Patients are able to view lab/test results, encounter notes, upcoming appointments, etc.  Non-urgent messages can be sent to your provider as well.   To learn more about what you can do with MyChart, go to [email or web address].    Your next appointment:   3 month(s)  The format for your next appointment:   In Person  Provider:   Dr. Harrell Gave

## 2020-10-08 ENCOUNTER — Ambulatory Visit: Payer: Self-pay | Admitting: General Surgery

## 2020-10-08 NOTE — H&P (View-Only) (Signed)
PROVIDER:  Monico Blitz, MD  MRN: [id number] DOB: 19-May-1945 DATE OF ENCOUNTER: 10/08/2020  Subjective   Chief Complaint: No chief complaint on file.     History of Present Illness: Alejandra Ford is a 75 y.o. female who is seen today as an office consultation at the request of Dr. Pasty Arch for evaluation of ascending colon cancer.  Patient is a smoker with past medical history of peripheral artery disease status post left femoral-femoral bypass and aortoiliac bypass who was admitted in early July for NSTEMI.  She was evaluated by cardiology and underwent an echo and heart catheterization which showed a distal left circumflex which was already collateralized.  Her ejection fraction was 50 to 55%.  It was felt that she did not need any further revascularization at this time.  She is felt to be stable for surgery from her cardiologist's standpoint as long as her beta-blocker and '81mg'$  aspirin can be continued.  Patient has a recent history of anemia and weight loss.  CT scan of the chest abdomen and pelvis were performed in late June 2022 showing a 7.5 cm ascending colon mass.  Colonoscopy was performed on 09/10/2020.  An ulcerated nonobstructing mass was found in the ascending colon.  No bleeding was present.  Area was tattooed distally.  Pathology showed poorly differentiated malignancy.  CT scans of the chest abdomen and pelvis show no sign of metastatic disease, although several lung lesions were noted for attention on f/u.  CEA was 2.8.  She has cut back to 2-3 cigarettes per day.  Review of Systems: A complete review of systems was obtained from the patient.  I have reviewed this information and discussed as appropriate with the patient.  See HPI as well for other ROS.   Medical History: Past Medical History:  Diagnosis Date   Anemia    Arthritis     Patient Active Problem List  Diagnosis   Anemia   Asymmetric SNHL (sensorineural hearing loss)   Colonic mass   De Quervain's  syndrome (tenosynovitis)   Essential hypertension   H/O aorta-iliac-femoral bypass   Hyperlipidemia   Hyponatremia   Leucocytosis   NSTEMI (non-ST elevated myocardial infarction) (CMS-HCC)   PAD (peripheral artery disease) (CMS-HCC)   Primary osteoarthritis of both first carpometacarpal joints   Protein-calorie malnutrition, severe (CMS-HCC)   Smoker   Unintended weight loss    Past Surgical History:  Procedure Laterality Date   abdominal aorta bypass       Allergies  Allergen Reactions   Oxycodone Nausea And Vomiting    Hallucinations/nightmares   Prednisone Nausea And Vomiting   Trazodone Anxiety    Current Outpatient Medications on File Prior to Visit  Medication Sig Dispense Refill   lactose-reduced food (ENSURE ENLIVE ORAL) Take by mouth     acetaminophen (TYLENOL) 500 MG tablet Take by mouth     aspirin 81 MG EC tablet Take by mouth     cholecalciferol (VITAMIN D3) 2,000 unit capsule Take 1 capsule by mouth at bedtime     ibuprofen (MOTRIN) 200 MG tablet Take by mouth     metoprolol tartrate (LOPRESSOR) 25 MG tablet      nitroGLYcerin (NITROSTAT) 0.4 MG SL tablet PLACE 1 TABLET UNDER THE TONGUE EVERY 5 MINUTES FOR 3 DOSES AS NEEDED FOR CHEST PAIN     polyethylene glycol (MIRALAX) powder Take by mouth     No current facility-administered medications on file prior to visit.    Family History  Problem Relation  Age of Onset   Hyperlipidemia (Elevated cholesterol) Mother      Social History   Tobacco Use  Smoking Status Current Every Day Smoker   Packs/day: 0.25   Types: Cigarettes  Smokeless Tobacco Never Used     Social History   Socioeconomic History   Marital status: Married  Tobacco Use   Smoking status: Current Every Day Smoker    Packs/day: 0.25    Types: Cigarettes   Smokeless tobacco: Never Used  Vaping Use   Vaping Use: Never used  Substance and Sexual Activity   Alcohol use: Never   Drug use: Never    Objective:    Vitals:    10/08/20 0957  BP: 120/60  Pulse: 90  Temp: 36.5 C (97.7 F)  Weight: 46.3 kg (102 lb)  Height: 161.3 cm (5' 3.5")     Exam Gen: NAD CV: RRR Chest: CTAB Abd: soft, non-distended, RLQ mass, aortic bypass scar    Labs, Imaging and Diagnostic Testing: CT scan of the chest abdomen and pelvis reviewed.  Mass noted in ascending colon.  No signs of metastatic disease.  Colonoscopy report reviewed.  Tattoo noted distal to the mass.  Assessment and Plan:  Diagnoses and all orders for this visit:  Colon cancer, ascending (CMS-HCC)  75 year old female smoker who presents to the office for evaluation of a new ascending colon cancer.  Patient has a history of a recent MI.  She also has a history of an open aortobifem bypass.  We have discussed these as risk factors for surgery.  We have discussed ways to minimize these risk factors as much as possible including quitting or stopping smoking altogether around the time of surgery and making sure that her nutrition and iron levels are as high as possible prior to surgery.  She was felt to be low to moderate risk for surgery by her cardiologist.  The surgery and anatomy were described to the patient as well as the risks of surgery and the possible complications.  These include: Bleeding, deep abdominal infections and possible wound complications such as hernia and infection, damage to adjacent structures, leak of surgical connections, which can lead to other surgeries and possibly an ostomy, possible need for other procedures, such as abscess drains in radiology, possible prolonged hospital stay, possible diarrhea from removal of part of the colon, possible constipation from narcotics, possible bowel, bladder or sexual dysfunction if having rectal surgery, prolonged fatigue/weakness or appetite loss, possible early recurrence of of disease, possible complications of their medical problems such as heart disease or arrhythmias or lung problems, death (less  than 1%). I believe the patient understands and wishes to proceed with the surgery.

## 2020-10-08 NOTE — H&P (Signed)
PROVIDER:  Monico Blitz, MD  MRN: [id number] DOB: 1945-10-28 DATE OF ENCOUNTER: 10/08/2020  Subjective   Chief Complaint: No chief complaint on file.     History of Present Illness: Alejandra Ford is a 75 y.o. female who is seen today as an office consultation at the request of Dr. Pasty Arch for evaluation of ascending colon cancer.  Patient is a smoker with past medical history of peripheral artery disease status post left femoral-femoral bypass and aortoiliac bypass who was admitted in early July for NSTEMI.  She was evaluated by cardiology and underwent an echo and heart catheterization which showed a distal left circumflex which was already collateralized.  Her ejection fraction was 50 to 55%.  It was felt that she did not need any further revascularization at this time.  She is felt to be stable for surgery from her cardiologist's standpoint as long as her beta-blocker and '81mg'$  aspirin can be continued.  Patient has a recent history of anemia and weight loss.  CT scan of the chest abdomen and pelvis were performed in late June 2022 showing a 7.5 cm ascending colon mass.  Colonoscopy was performed on 09/10/2020.  An ulcerated nonobstructing mass was found in the ascending colon.  No bleeding was present.  Area was tattooed distally.  Pathology showed poorly differentiated malignancy.  CT scans of the chest abdomen and pelvis show no sign of metastatic disease, although several lung lesions were noted for attention on f/u.  CEA was 2.8.  She has cut back to 2-3 cigarettes per day.  Review of Systems: A complete review of systems was obtained from the patient.  I have reviewed this information and discussed as appropriate with the patient.  See HPI as well for other ROS.   Medical History: Past Medical History:  Diagnosis Date   Anemia    Arthritis     Patient Active Problem List  Diagnosis   Anemia   Asymmetric SNHL (sensorineural hearing loss)   Colonic mass   De Quervain's  syndrome (tenosynovitis)   Essential hypertension   H/O aorta-iliac-femoral bypass   Hyperlipidemia   Hyponatremia   Leucocytosis   NSTEMI (non-ST elevated myocardial infarction) (CMS-HCC)   PAD (peripheral artery disease) (CMS-HCC)   Primary osteoarthritis of both first carpometacarpal joints   Protein-calorie malnutrition, severe (CMS-HCC)   Smoker   Unintended weight loss    Past Surgical History:  Procedure Laterality Date   abdominal aorta bypass       Allergies  Allergen Reactions   Oxycodone Nausea And Vomiting    Hallucinations/nightmares   Prednisone Nausea And Vomiting   Trazodone Anxiety    Current Outpatient Medications on File Prior to Visit  Medication Sig Dispense Refill   lactose-reduced food (ENSURE ENLIVE ORAL) Take by mouth     acetaminophen (TYLENOL) 500 MG tablet Take by mouth     aspirin 81 MG EC tablet Take by mouth     cholecalciferol (VITAMIN D3) 2,000 unit capsule Take 1 capsule by mouth at bedtime     ibuprofen (MOTRIN) 200 MG tablet Take by mouth     metoprolol tartrate (LOPRESSOR) 25 MG tablet      nitroGLYcerin (NITROSTAT) 0.4 MG SL tablet PLACE 1 TABLET UNDER THE TONGUE EVERY 5 MINUTES FOR 3 DOSES AS NEEDED FOR CHEST PAIN     polyethylene glycol (MIRALAX) powder Take by mouth     No current facility-administered medications on file prior to visit.    Family History  Problem Relation  Age of Onset   Hyperlipidemia (Elevated cholesterol) Mother      Social History   Tobacco Use  Smoking Status Current Every Day Smoker   Packs/day: 0.25   Types: Cigarettes  Smokeless Tobacco Never Used     Social History   Socioeconomic History   Marital status: Married  Tobacco Use   Smoking status: Current Every Day Smoker    Packs/day: 0.25    Types: Cigarettes   Smokeless tobacco: Never Used  Vaping Use   Vaping Use: Never used  Substance and Sexual Activity   Alcohol use: Never   Drug use: Never    Objective:    Vitals:    10/08/20 0957  BP: 120/60  Pulse: 90  Temp: 36.5 C (97.7 F)  Weight: 46.3 kg (102 lb)  Height: 161.3 cm (5' 3.5")     Exam Gen: NAD CV: RRR Chest: CTAB Abd: soft, non-distended, RLQ mass, aortic bypass scar    Labs, Imaging and Diagnostic Testing: CT scan of the chest abdomen and pelvis reviewed.  Mass noted in ascending colon.  No signs of metastatic disease.  Colonoscopy report reviewed.  Tattoo noted distal to the mass.  Assessment and Plan:  Diagnoses and all orders for this visit:  Colon cancer, ascending (CMS-HCC)  75 year old female smoker who presents to the office for evaluation of a new ascending colon cancer.  Patient has a history of a recent MI.  She also has a history of an open aortobifem bypass.  We have discussed these as risk factors for surgery.  We have discussed ways to minimize these risk factors as much as possible including quitting or stopping smoking altogether around the time of surgery and making sure that her nutrition and iron levels are as high as possible prior to surgery.  She was felt to be low to moderate risk for surgery by her cardiologist.  The surgery and anatomy were described to the patient as well as the risks of surgery and the possible complications.  These include: Bleeding, deep abdominal infections and possible wound complications such as hernia and infection, damage to adjacent structures, leak of surgical connections, which can lead to other surgeries and possibly an ostomy, possible need for other procedures, such as abscess drains in radiology, possible prolonged hospital stay, possible diarrhea from removal of part of the colon, possible constipation from narcotics, possible bowel, bladder or sexual dysfunction if having rectal surgery, prolonged fatigue/weakness or appetite loss, possible early recurrence of of disease, possible complications of their medical problems such as heart disease or arrhythmias or lung problems, death (less  than 1%). I believe the patient understands and wishes to proceed with the surgery.

## 2020-10-12 NOTE — Patient Instructions (Addendum)
DUE TO COVID-19 ONLY ONE VISITOR IS ALLOWED TO COME WITH YOU AND STAY IN THE WAITING ROOM ONLY DURING PRE OP AND PROCEDURE.   **NO VISITORS ARE ALLOWED IN THE SHORT STAY AREA OR RECOVERY ROOM!!**  IF YOU WILL BE ADMITTED INTO THE HOSPITAL YOU ARE ALLOWED ONLY TWO SUPPORT PEOPLE DURING VISITATION HOURS ONLY (10AM -8PM)   The support person(s) may change daily. The support person(s) must pass our screening, gel in and out, and wear a mask at all times, including in the patient's room. Patients must also wear a mask when staff or their support person are in the room.  No visitors under the age of 48. Any visitor under the age of 43 must be accompanied by an adult.    COVID SWAB TESTING MUST BE COMPLETED ON:  10/19/20 **MUST PRESENT COMPLETED FORM AT TESTING SITE**    [facility] (backside of the building) Open 8am-3pm. No appointment needed. You are not required to quarantine, however you are required to wear a well-fitted mask when you are out and around people not in your household.  Hand Hygiene often Do NOT share personal items Notify your provider if you are in close contact with someone who has COVID or you develop fever 100.4 or greater, new onset of sneezing, cough, sore throat, shortness of breath or body aches.       Your procedure is scheduled on: 10/21/20   Report to [facility] Main  Entrance    Report to admitting at 6:30 AM   Call this number if you have problems the morning of surgery [phone]   Do not eat food :After Midnight.   May have liquids until  5:30 AM day of surgery  CLEAR LIQUID DIET  Foods Allowed                                                                     Foods Excluded  Water, Black Coffee and tea, regular and decaf               liquids that you cannot  Plain Jell-O in any flavor  (No red)                                    see through such as: Fruit ices (not with fruit pulp)                                            milk, soups, orange juice              Iced Popsicles (No red)                                               All solid food  Apple juices Sports drinks like Gatorade (No red) Lightly seasoned clear broth or consume(fat free) Sugar, honey syrup    Complete one Ensure drink the morning of surgery at 5:30 AM  the day of surgery.   Drink 2 Ensure drinks the night before surgery have them finished by 10:00 PM    The day of surgery:  Drink ONE (1) Pre-Surgery Clear Ensure by 5:30 am the morning of surgery. Drink in one sitting. Do not sip.  This drink was given to you during your hospital  pre-op appointment visit. Nothing else to drink after completing the  Pre-Surgery Clear Ensure.          If you have questions, please contact your surgeon's office.     Oral Hygiene is also important to reduce your risk of infection.                                    Remember - BRUSH YOUR TEETH THE MORNING OF SURGERY WITH YOUR REGULAR TOOTHPASTE   Do NOT smoke after Midnight   Take these medicines the morning of surgery with A SIP OF WATER: Tylenol, Atorvastatin, Metoprolol Tartate.                               You may not have any metal on your body including hair pins, jewelry, and body piercing             Do not wear make-up, lotions, powders, perfumes, or deodorant  Do not wear nail polish including gel and S&S, artificial/acrylic nails, or any other type of covering on natural nails including finger and toenails. If you have artificial nails, gel coating, etc. that needs to be removed by a nail salon please have this removed prior to surgery or surgery may need to be canceled/ delayed if the surgeon/ anesthesia feels like they are unable to be safely monitored.   Do not shave  48 hours prior to surgery.    Do not bring valuables to the hospital. [place].   Contacts, dentures or bridgework may not be  worn into surgery.   Bring small overnight bag day of surgery.   Special Instructions: Bring a copy of your healthcare power of attorney and living will documents         the day of surgery if you haven't scanned them in before.   Please read over the following fact sheets you were given: IF YOU HAVE QUESTIONS ABOUT YOUR PRE OP INSTRUCTIONS PLEASE CALL [phone]- [facility] - Preparing for Surgery Before surgery, you can play an important role.  Because skin is not sterile, your skin needs to be as free of germs as possible.  You can reduce the number of germs on your skin by washing with CHG (chlorahexidine gluconate) soap before surgery.  CHG is an antiseptic cleaner which kills germs and bonds with the skin to continue killing germs even after washing. Please DO NOT use if you have an allergy to CHG or antibacterial soaps.  If your skin becomes reddened/irritated stop using the CHG and inform your nurse when you arrive at Short Stay. Do not shave (including legs and underarms) for at least 48 hours prior to the first CHG shower.  You may shave your face/neck.  Please follow these instructions carefully:  1.  Shower with CHG Soap the night before surgery and the  morning of surgery.  2.  If you choose to wash your hair, wash your hair first as usual with your normal  shampoo.  3.  After you shampoo, rinse your hair and body thoroughly to remove the shampoo.                             4.  Use CHG as you would any other liquid soap.  You can apply chg directly to the skin and wash.  Gently with a scrungie or clean washcloth.  5.  Apply the CHG Soap to your body ONLY FROM THE NECK DOWN.   Do   not use on face/ open                           Wound or open sores. Avoid contact with eyes, ears mouth and   genitals (private parts).                       Wash face,  Genitals (private parts) with your normal soap.             6.  Wash thoroughly, paying special attention to the area where  your    surgery  will be performed.  7.  Thoroughly rinse your body with warm water from the neck down.  8.  DO NOT shower/wash with your normal soap after using and rinsing off the CHG Soap.                9.  Pat yourself dry with a clean towel.            10.  Wear clean pajamas.            11.  Place clean sheets on your bed the night of your first shower and do not  sleep with pets. Day of Surgery : Do not apply any lotions/deodorants the morning of surgery.  Please wear clean clothes to the hospital/surgery center.  FAILURE TO FOLLOW THESE INSTRUCTIONS MAY RESULT IN THE CANCELLATION OF YOUR SURGERY  PATIENT SIGNATURE_________________________________  NURSE SIGNATURE__________________________________  ________________________________________________________________________   Adam Phenix  An incentive spirometer is a tool that can help keep your lungs clear and active. This tool measures how well you are filling your lungs with each breath. Taking long deep breaths may help reverse or decrease the chance of developing breathing (pulmonary) problems (especially infection) following: A long period of time when you are unable to move or be active. BEFORE THE PROCEDURE  If the spirometer includes an indicator to show your best effort, your nurse or respiratory therapist will set it to a desired goal. If possible, sit up straight or lean slightly forward. Try not to slouch. Hold the incentive spirometer in an upright position. INSTRUCTIONS FOR USE  Sit on the edge of your bed if possible, or sit up as far as you can in bed or on a chair. Hold the incentive spirometer in an upright position. Breathe out normally. Place the mouthpiece in your mouth and seal your lips tightly around it. Breathe in slowly and as deeply as possible, raising the piston or the ball toward the top of the column. Hold your breath for 3-5 seconds or for as long as possible.  Allow the piston or ball to  fall to the bottom of the column. Remove the mouthpiece from your mouth and breathe out normally. Rest for a few seconds and repeat Steps 1 through 7 at least 10 times every 1-2 hours when you are awake. Take your time and take a few normal breaths between deep breaths. The spirometer may include an indicator to show your best effort. Use the indicator as a goal to work toward during each repetition. After each set of 10 deep breaths, practice coughing to be sure your lungs are clear. If you have an incision (the cut made at the time of surgery), support your incision when coughing by placing a pillow or rolled up towels firmly against it. Once you are able to get out of bed, walk around indoors and cough well. You may stop using the incentive spirometer when instructed by your caregiver.  RISKS AND COMPLICATIONS Take your time so you do not get dizzy or light-headed. If you are in pain, you may need to take or ask for pain medication before doing incentive spirometry. It is harder to take a deep breath if you are having pain. AFTER USE Rest and breathe slowly and easily. It can be helpful to keep track of a log of your progress. Your caregiver can provide you with a simple table to help with this. If you are using the spirometer at home, follow these instructions: [place] IF:  You are having difficultly using the spirometer. You have trouble using the spirometer as often as instructed. Your pain medication is not giving enough relief while using the spirometer. You develop fever of 100.5 F (38.1 C) or higher. SEEK IMMEDIATE MEDICAL CARE IF:  You cough up bloody sputum that had not been present before. You develop fever of 102 F (38.9 C) or greater. You develop worsening pain at or near the incision site. MAKE SURE YOU:  Understand these instructions. Will watch your condition. Will get help right away if you are not doing well or get worse. Document Released: 07/04/2006  Document Revised: 05/16/2011 Document Reviewed: 09/04/2006 [employer] Patient Information 2014 [employer], [place].   ________________________________________________________________________  WHAT IS A BLOOD TRANSFUSION? Blood Transfusion Information  A transfusion is the replacement of blood or some of its parts. Blood is made up of multiple cells which provide different functions. Red blood cells carry oxygen and are used for blood loss replacement. White blood cells fight against infection. Platelets control bleeding. Plasma helps clot blood. Other blood products are available for specialized needs, such as hemophilia or other clotting disorders. BEFORE THE TRANSFUSION  Who gives blood for transfusions?  Healthy volunteers who are fully evaluated to make sure their blood is safe. This is blood bank blood. Transfusion therapy is the safest it has ever been in the practice of medicine. Before blood is taken from a donor, a complete history is taken to make sure that person has no history of diseases nor engages in risky social behavior (examples are intravenous drug use or sexual activity with multiple partners). The donor's travel history is screened to minimize risk of transmitting infections, such as malaria. The donated blood is tested for signs of infectious diseases, such as HIV and hepatitis. The blood is then tested to be sure it is compatible with you in order to minimize the chance of a transfusion reaction. If you or a relative donates blood, this is often done in anticipation of surgery and is not appropriate for emergency situations. It  takes many days to process the donated blood. RISKS AND COMPLICATIONS Although transfusion therapy is very safe and saves many lives, the main dangers of transfusion include:  Getting an infectious disease. Developing a transfusion reaction. This is an allergic reaction to something in the blood you were given. Every precaution is taken to prevent  this. The decision to have a blood transfusion has been considered carefully by your caregiver before blood is given. Blood is not given unless the benefits outweigh the risks. AFTER THE TRANSFUSION Right after receiving a blood transfusion, you will usually feel much better and more energetic. This is especially true if your red blood cells have gotten low (anemic). The transfusion raises the level of the red blood cells which carry oxygen, and this usually causes an energy increase. The nurse administering the transfusion will monitor you carefully for complications. HOME CARE INSTRUCTIONS  No special instructions are needed after a transfusion. You may find your energy is better. Speak with your caregiver about any limitations on activity for underlying diseases you may have. SEEK MEDICAL CARE IF:  Your condition is not improving after your transfusion. You develop redness or irritation at the intravenous (IV) site. SEEK IMMEDIATE MEDICAL CARE IF:  Any of the following symptoms occur over the next 12 hours: Shaking chills. You have a temperature by mouth above 102 F (38.9 C), not controlled by medicine. Chest, back, or muscle pain. People around you feel you are not acting correctly or are confused. Shortness of breath or difficulty breathing. Dizziness and fainting. You get a rash or develop hives. You have a decrease in urine output. Your urine turns a dark color or changes to pink, red, or brown. Any of the following symptoms occur over the next 10 days: You have a temperature by mouth above 102 F (38.9 C), not controlled by medicine. Shortness of breath. Weakness after normal activity. The white part of the eye turns yellow (jaundice). You have a decrease in the amount of urine or are urinating less often. Your urine turns a dark color or changes to pink, red, or brown. Document Released: 02/19/2000 Document Revised: 05/16/2011 Document Reviewed: 10/08/2007 [facility] Patient  Information 2014 [place], [place].  _______________________________________________________________________

## 2020-10-12 NOTE — Progress Notes (Addendum)
COVID Vaccine Completed: yes x4 Date COVID Vaccine completed: 04/19/19, 05/12/19 Has received booster: COVID vaccine manufacturer: Pfizer      Date of COVID positive in last 90 days: No  PCP - Sela Hilding, MD Cardiologist - Buford Dresser, MD  Chest x-ray - 09/08/20 Epic EKG - 10/05/20 Epic Stress Test - 12 years ago per pt ECHO - 09/09/20 Epic Cardiac Cath - 09/09/20 Epic Pacemaker/ICD device last checked: N/a Spinal Cord Stimulator: N/a  Sleep Study - N/a CPAP -   Fasting Blood Sugar - N/a Checks Blood Sugar _____ times a day  Blood Thinner Instructions: Aspirin Instructions: ASA 81, no instructions given. Instructed to ask PCP about holding prior to surgery. Last Dose:  Activity level: Can go up a flight of stairs and perform activities of daily living without stopping and without symptoms of chest pain or shortness of breath.       Anesthesia review: recent NSTEMI, HTN, PAD, Hgb 9.2 at PAT visit.  Patient denies shortness of breath, fever, cough and chest pain at PAT appointment   Patient verbalized understanding of instructions that were given to them at the PAT appointment. Patient was also instructed that they will need to review over the PAT instructions again at home before surgery.

## 2020-10-13 ENCOUNTER — Encounter (HOSPITAL_COMMUNITY)
Admission: RE | Admit: 2020-10-13 | Discharge: 2020-10-13 | Disposition: A | Discharge status: Home / Self Care | Payer: Medicare Other | Source: Ambulatory Visit | Attending: General Surgery | Admitting: General Surgery

## 2020-10-13 ENCOUNTER — Encounter (HOSPITAL_COMMUNITY): Payer: Self-pay

## 2020-10-13 ENCOUNTER — Other Ambulatory Visit: Payer: Self-pay

## 2020-10-13 DIAGNOSIS — Z01812 Encounter for preprocedural laboratory examination: Secondary | ICD-10-CM | POA: Diagnosis present

## 2020-10-13 HISTORY — DX: Unspecified osteoarthritis, unspecified site: M19.90

## 2020-10-13 HISTORY — DX: Malignant (primary) neoplasm, unspecified: C80.1

## 2020-10-13 HISTORY — DX: Cardiac arrhythmia, unspecified: I49.9

## 2020-10-13 HISTORY — DX: Pneumonia, unspecified organism: J18.9

## 2020-10-13 HISTORY — DX: Acute myocardial infarction, unspecified: I21.9

## 2020-10-13 LAB — BASIC METABOLIC PANEL
Anion gap: 12 (ref 5–15)
BUN: 16 mg/dL (ref 8–23)
CO2: 26 mmol/L (ref 22–32)
Calcium: 9 mg/dL (ref 8.9–10.3)
Chloride: 95 mmol/L — ABNORMAL LOW (ref 98–111)
Creatinine, Ser: 0.81 mg/dL (ref 0.44–1.00)
GFR, Estimated: >60 mL/min (ref >60)
Glucose, Bld: 105 mg/dL — ABNORMAL HIGH (ref 70–99)
Potassium: 4.1 mmol/L (ref 3.5–5.1)
Sodium: 133 mmol/L — ABNORMAL LOW (ref 135–145)

## 2020-10-13 LAB — CBC
HCT: 29.2 % — ABNORMAL LOW (ref 36.0–46.0)
Hemoglobin: 9.2 g/dL — ABNORMAL LOW (ref 12.0–15.0)
MCH: 25 pg — ABNORMAL LOW (ref 26.0–34.0)
MCHC: 31.5 g/dL (ref 30.0–36.0)
MCV: 79.3 fL — ABNORMAL LOW (ref 80.0–100.0)
Platelets: 547 10*3/uL — ABNORMAL HIGH (ref 150–400)
RBC: 3.68 MIL/uL — ABNORMAL LOW (ref 3.87–5.11)
RDW: 15.6 % — ABNORMAL HIGH (ref 11.5–15.5)
WBC: 10 10*3/uL (ref 4.0–10.5)
nRBC: 0 % (ref 0.0–0.2)

## 2020-10-13 LAB — HEMOGLOBIN A1C
Hgb A1c MFr Bld: 6.3 % — ABNORMAL HIGH (ref 4.8–5.6)
Mean Plasma Glucose: 134.11 mg/dL

## 2020-10-14 NOTE — Anesthesia Preprocedure Evaluation (Addendum)
Anesthesia Evaluation  Patient identified by MRN, date of birth, ID band Patient awake    Reviewed: Allergy & Precautions, NPO status , Patient's Chart, lab work & pertinent test results  Airway Mallampati: II  TM Distance: >3 FB Neck ROM: Full    Dental  (+) Dental Advisory Given   Pulmonary Patient abstained from smoking., former smoker,    breath sounds clear to auscultation       Cardiovascular hypertension, Pt. on medications and Pt. on home beta blockers + CAD, + Past MI and + Peripheral Vascular Disease  + dysrhythmias  Rhythm:Regular Rate:Normal     Neuro/Psych negative neurological ROS     GI/Hepatic Neg liver ROS, Colon mass   Endo/Other  negative endocrine ROS  Renal/GU negative Renal ROS     Musculoskeletal  (+) Arthritis ,   Abdominal   Peds  Hematology  (+) anemia ,   Anesthesia Other Findings   Reproductive/Obstetrics                           Lab Results  Component Value Date   WBC 10.0 10/13/2020   HGB 9.2 (L) 10/13/2020   HCT 29.2 (L) 10/13/2020   MCV 79.3 (L) 10/13/2020   PLT 547 (H) 10/13/2020   Lab Results  Component Value Date   CREATININE 0.81 10/13/2020   BUN 16 10/13/2020   NA 133 (L) 10/13/2020   K 4.1 10/13/2020   CL 95 (L) 10/13/2020   CO2 26 10/13/2020   LHC 09/09/2020: . Dist Cx lesion is 100% stenosed. . Prox RCA lesion is 50% stenosed.  1. 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 There is also moderate proximal RCA disease. 2. Left ventricular angiography was not performed. EF was low normal by echo with posterior wall hypokinesis. Mildly elevated left ventricular end-diastolic pressure.  Recommendations: This is a late presenting posterior myocardial infarction. The occlusion involves distal left circumflex which has already collateralized. The  patient is chest pain-free and thus no indication for revascularization at this point. I recommend continuing medical therapy. I discontinued heparin drip. The patient can proceed with endoscopic GI procedures for diagnosis of colon mass at an overall low to moderate risk.  Echo 09/09/2020: 1. Left ventricular ejection fraction, by estimation, is 50 to 55%. The left ventricle has low normal function. The left ventricle demonstrates regional wall motion abnormalities (see scoring diagram/findings for  description). Left ventricular diastolic parameters are indeterminate. Elevated left ventricular end-diastolic  pressure. There is hypokinesis of the left ventricular, mid-apical inferior wall, inferolateral wall and anterolateral wall. There is akinesis of the left ventricular, apical inferior wall.  2. Right ventricular systolic function is normal. The right ventricular size is normal. Tricuspid regurgitation signal is inadequate for assessing PA pressure.  3. The mitral valve is normal in structure. Mild mitral valve regurgitation. No evidence of mitral stenosis.  4. The aortic valve is tricuspid. There is mild calcification of the aortic valve. There is mild thickening of the aortic valve. Aortic valve regurgitation is not visualized. Mild aortic valve sclerosis is present,  with no evidence of aortic valve stenosis.  5. The inferior vena cava is normal in size with greater than 50% respiratory variability, suggesting right atrial pressure of 3 mmHg.   Comparison(s): Prior images unable to be directly viewed, comparison made  by report only.   Conclusion(s)/Recommendation(s): Wall motion abnormalities in the lateral and  inferior wall. Results communicated to primary team, cardiology team  also aware.   Anesthesia Physical Anesthesia Plan  ASA: 4  Anesthesia Plan: General   Post-op Pain Management:    Induction: Intravenous  PONV Risk Score and Plan: 3 and Ondansetron,  Dexamethasone and Treatment may vary due to age or medical condition  Airway Management Planned: Oral ETT  Additional Equipment: Arterial line  Intra-op Plan:   Post-operative Plan: Extubation in OR  Informed Consent: I have reviewed the patients History and Physical, chart, labs and discussed the procedure including the risks, benefits and alternatives for the proposed anesthesia with the patient or authorized representative who has indicated his/her understanding and acceptance.     Dental advisory given  Plan Discussed with: CRNA  Anesthesia Plan Comments: (2 IV's, A-line, Type and cross. GETA)      Anesthesia Quick Evaluation

## 2020-10-14 NOTE — Progress Notes (Signed)
Anesthesia Chart Review   Case: [id number] Date/Time: 10/21/20 0815   Procedure: XI ROBOT ASSISTED LAPAROSCOPIC PARTIAL COLECTOMY - 2.5 HRS   Anesthesia type: General   Pre-op diagnosis: ASCENDING COLON CANCER   Location: [facility] ROOM 02 / [facility]   Surgeons: Leighton Ruff, MD       [age] y.o. former smoker with h/o PAD, CAD (NSTEMI 09/2020), colon cancer scheduled for above procedure [id number] with Dr. Leighton Ruff.   Pt last seen by cardiology 10/05/2020. Per OV note, "biggest unknown is next step and prognosis with her malignancy. Given recent cath, would not perform additional testing prior to surgery if needed; would continue beta blocker perioperatively and aspirin if possible given recent NSTEMI -she is hesitant about starting cardiac rehab. I think it is reasonable to wait until we have more of an understanding regarding plans for her malignancy before starting a cardiac rehab program."  VS: BP 132/90   Pulse 78   Temp 36.6 C (Oral)   Resp 14   Ht 5' 3.5" (1.613 m)   Wt 45.5 kg   SpO2 100%   BMI 17.51 kg/m   PROVIDERS: Glenis Smoker, MD is PCP   Buford Dresser, MD is Cardiologist  LABS: Labs reviewed: Acceptable for surgery. (all labs ordered are listed, but only abnormal results are displayed)  Labs Reviewed  HEMOGLOBIN A1C - Abnormal; Notable for the following components:      Result Value   Hgb A1c MFr Bld 6.3 (*)    All other components within normal limits  BASIC METABOLIC PANEL - Abnormal; Notable for the following components:   Sodium 133 (*)    Chloride 95 (*)    Glucose, Bld 105 (*)    All other components within normal limits  CBC - Abnormal; Notable for the following components:   RBC 3.68 (*)    Hemoglobin 9.2 (*)    HCT 29.2 (*)    MCV 79.3 (*)    MCH 25.0 (*)    RDW 15.6 (*)    Platelets 547 (*)    All other components within normal limits  TYPE AND SCREEN     IMAGES:   EKG: 10/05/2020 Rate 78 bpm  NSR T wave  abnormality, consider inferolateral ischemia  CV: Cardiac Cath 09/09/2020 Dist Cx lesion is 100% stenosed. Prox RCA lesion is 50% stenosed.   1.  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  There is also moderate proximal RCA disease. 2.  Left ventricular angiography was not performed.  EF was low normal by echo with posterior wall hypokinesis.  Mildly elevated left ventricular end-diastolic pressure.   Recommendations: This is a late presenting posterior myocardial infarction.  The occlusion involves distal left circumflex which has already collateralized.  The patient is chest pain-free and thus no indication for revascularization at this point.  I recommend continuing medical therapy. I discontinued heparin drip. The patient can proceed with endoscopic GI procedures for diagnosis of colon mass at an overall low to moderate risk.  Echo 09/09/2020  1. Left ventricular ejection fraction, by estimation, is 50 to 55%. The  left ventricle has low normal function. The left ventricle demonstrates  regional wall motion abnormalities (see scoring diagram/findings for  description). Left ventricular diastolic   parameters are indeterminate. Elevated left ventricular end-diastolic  pressure. There is hypokinesis of the left ventricular, mid-apical  inferior wall, inferolateral wall and anterolateral wall. There is  akinesis of the  left ventricular, apical inferior   wall.   2. Right ventricular systolic function is normal. The right ventricular  size is normal. Tricuspid regurgitation signal is inadequate for assessing  PA pressure.   3. The mitral valve is normal in structure. Mild mitral valve  regurgitation. No evidence of mitral stenosis.   4. The aortic valve is tricuspid. There is mild calcification of the  aortic valve. There is mild thickening of the aortic valve. Aortic valve  regurgitation is not  visualized. Mild aortic valve sclerosis is present,  with no evidence of aortic valve  stenosis.   5. The inferior vena cava is normal in size with greater than 50%  respiratory variability, suggesting right atrial pressure of 3 mmHg.  Past Medical History:  Diagnosis Date   Arthritis    Cancer ([place])    colon   Dysrhythmia    HLD (hyperlipidemia)    Myocardial infarction [facility])    PAD (peripheral artery disease) ([place])    Pneumonia    Tobacco abuse     Past Surgical History:  Procedure Laterality Date   BIOPSY  09/10/2020   Procedure: BIOPSY;  Surgeon: Clarene Essex, MD;  Location: [place];  Service: Endoscopy;;   COLONOSCOPY N/A 09/10/2020   Procedure: COLONOSCOPY;  Surgeon: Clarene Essex, MD;  Location: [place];  Service: Endoscopy;  Laterality: N/A;   LEFT HEART CATH AND CORONARY ANGIOGRAPHY N/A 09/09/2020   Procedure: LEFT HEART CATH AND CORONARY ANGIOGRAPHY;  Surgeon: Wellington Hampshire, MD;  Location: [place] [facility];  Service: Cardiovascular;  Laterality: N/A;   SUBMUCOSAL TATTOO INJECTION  09/10/2020   Procedure: SUBMUCOSAL TATTOO INJECTION;  Surgeon: Clarene Essex, MD;  Location: [facility] ENDOSCOPY;  Service: Endoscopy;;   wisodm teeth Bilateral     MEDICATIONS:  acetaminophen (TYLENOL) 500 MG tablet   aspirin EC 81 MG tablet   atorvastatin (LIPITOR) 80 MG tablet   betamethasone dipropionate 0.05 % cream   Cholecalciferol (VITAMIN D) 50 MCG (2000 UT) CAPS   ibuprofen (ADVIL) 200 MG tablet   metoprolol tartrate (LOPRESSOR) 25 MG tablet   nitroGLYCERIN (NITROSTAT) 0.4 MG SL tablet   polyethylene glycol (MIRALAX / GLYCOLAX) 17 g packet   No current facility-administered medications for this encounter.    Konrad Felix, PA-C [facility] [phone]

## 2020-10-19 ENCOUNTER — Other Ambulatory Visit: Payer: Self-pay | Admitting: General Surgery

## 2020-10-20 LAB — SARS CORONAVIRUS 2 (TAT 6-24 HRS): SARS Coronavirus 2: NEGATIVE

## 2020-10-20 MED ORDER — BUPIVACAINE LIPOSOME 1.3 % IJ SUSP
20.0000 mL | Freq: Once | INTRAMUSCULAR | Status: AC
  Filled 2020-10-20: qty 20

## 2020-10-21 ENCOUNTER — Inpatient Hospital Stay (HOSPITAL_COMMUNITY): Payer: Medicare Other | Admitting: Anesthesiology

## 2020-10-21 ENCOUNTER — Encounter (HOSPITAL_COMMUNITY)
Admission: RE | Disposition: A | Discharge status: Home / Self Care | Payer: Self-pay | Source: Home / Self Care | Attending: General Surgery

## 2020-10-21 ENCOUNTER — Encounter (HOSPITAL_COMMUNITY): Payer: Self-pay | Admitting: General Surgery

## 2020-10-21 ENCOUNTER — Inpatient Hospital Stay (HOSPITAL_COMMUNITY): Payer: Medicare Other | Admitting: Physician Assistant

## 2020-10-21 ENCOUNTER — Inpatient Hospital Stay (HOSPITAL_COMMUNITY)
Admission: RE | Admit: 2020-10-21 | Discharge: 2020-10-23 | DRG: 330 | Disposition: A | Discharge status: Home / Self Care | Payer: Medicare Other | Attending: General Surgery | Admitting: General Surgery

## 2020-10-21 ENCOUNTER — Other Ambulatory Visit: Payer: Self-pay

## 2020-10-21 DIAGNOSIS — I10 Essential (primary) hypertension: Secondary | ICD-10-CM | POA: Diagnosis present

## 2020-10-21 DIAGNOSIS — D62 Acute posthemorrhagic anemia: Secondary | ICD-10-CM | POA: Diagnosis not present

## 2020-10-21 DIAGNOSIS — I739 Peripheral vascular disease, unspecified: Secondary | ICD-10-CM | POA: Diagnosis present

## 2020-10-21 DIAGNOSIS — I2510 Atherosclerotic heart disease of native coronary artery without angina pectoris: Secondary | ICD-10-CM | POA: Diagnosis present

## 2020-10-21 DIAGNOSIS — F17210 Nicotine dependence, cigarettes, uncomplicated: Secondary | ICD-10-CM | POA: Diagnosis present

## 2020-10-21 DIAGNOSIS — I252 Old myocardial infarction: Secondary | ICD-10-CM | POA: Diagnosis not present

## 2020-10-21 DIAGNOSIS — Z7982 Long term (current) use of aspirin: Secondary | ICD-10-CM | POA: Diagnosis not present

## 2020-10-21 DIAGNOSIS — K660 Peritoneal adhesions (postprocedural) (postinfection): Secondary | ICD-10-CM | POA: Diagnosis present

## 2020-10-21 DIAGNOSIS — E785 Hyperlipidemia, unspecified: Secondary | ICD-10-CM | POA: Diagnosis present

## 2020-10-21 DIAGNOSIS — E440 Moderate protein-calorie malnutrition: Secondary | ICD-10-CM | POA: Insufficient documentation

## 2020-10-21 DIAGNOSIS — Z79899 Other long term (current) drug therapy: Secondary | ICD-10-CM

## 2020-10-21 DIAGNOSIS — C182 Malignant neoplasm of ascending colon: Principal | ICD-10-CM | POA: Diagnosis present

## 2020-10-21 DIAGNOSIS — Z681 Body mass index (BMI) 19 or less, adult: Secondary | ICD-10-CM | POA: Diagnosis not present

## 2020-10-21 LAB — TYPE AND SCREEN
ABO/RH(D): O POS
Antibody Screen: NEGATIVE

## 2020-10-21 SURGERY — COLECTOMY, PARTIAL, ROBOT-ASSISTED, LAPAROSCOPIC
Anesthesia: General | Site: Abdomen

## 2020-10-21 MED ORDER — HYDROMORPHONE HCL 1 MG/ML IJ SOLN
0.5000 mg | INTRAMUSCULAR | Status: DC | PRN
Start: 2020-10-21 — End: 2020-10-23
  Administered 2020-10-22: 0.5 mg via INTRAVENOUS
  Filled 2020-10-21: qty 0.5

## 2020-10-21 MED ORDER — LACTATED RINGERS IR SOLN
Status: DC | PRN
  Administered 2020-10-21: 1000 mL

## 2020-10-21 MED ORDER — NITROGLYCERIN 0.4 MG SL SUBL: 0.4000 mg | SUBLINGUAL_TABLET | SUBLINGUAL | Status: DC | PRN

## 2020-10-21 MED ORDER — ALUM & MAG HYDROXIDE-SIMETH 200-200-20 MG/5ML PO SUSP: 30.0000 mL | Freq: Four times a day (QID) | ORAL | Status: DC | PRN

## 2020-10-21 MED ORDER — ONDANSETRON HCL 4 MG/2ML IJ SOLN: 4.0000 mg | Freq: Four times a day (QID) | INTRAMUSCULAR | Status: DC | PRN

## 2020-10-21 MED ORDER — SUGAMMADEX SODIUM 200 MG/2ML IV SOLN
INTRAVENOUS | Status: DC | PRN
  Administered 2020-10-21: 150 mg via INTRAVENOUS

## 2020-10-21 MED ORDER — ROCURONIUM BROMIDE 10 MG/ML (PF) SYRINGE
PREFILLED_SYRINGE | INTRAVENOUS | Status: DC | PRN
  Administered 2020-10-21: 50 mg via INTRAVENOUS

## 2020-10-21 MED ORDER — KCL IN DEXTROSE-NACL 20-5-0.45 MEQ/L-%-% IV SOLN
INTRAVENOUS | Status: DC
  Filled 2020-10-21 (×3): qty 1000

## 2020-10-21 MED ORDER — LIDOCAINE 2% (20 MG/ML) 5 ML SYRINGE
INTRAMUSCULAR | Status: AC
  Filled 2020-10-21: qty 5

## 2020-10-21 MED ORDER — DEXAMETHASONE SODIUM PHOSPHATE 10 MG/ML IJ SOLN
INTRAMUSCULAR | Status: AC
  Filled 2020-10-21: qty 1

## 2020-10-21 MED ORDER — ENSURE PRE-SURGERY PO LIQD
296.0000 mL | Freq: Once | ORAL | Status: DC
  Filled 2020-10-21: qty 296

## 2020-10-21 MED ORDER — ONDANSETRON HCL 4 MG/2ML IJ SOLN
INTRAMUSCULAR | Status: DC | PRN
  Administered 2020-10-21: 4 mg via INTRAVENOUS

## 2020-10-21 MED ORDER — PROPOFOL 10 MG/ML IV BOLUS
INTRAVENOUS | Status: DC | PRN
  Administered 2020-10-21: 80 mg via INTRAVENOUS

## 2020-10-21 MED ORDER — ENSURE PRE-SURGERY PO LIQD
592.0000 mL | Freq: Once | ORAL | Status: AC
  Filled 2020-10-21: qty 592

## 2020-10-21 MED ORDER — FENTANYL CITRATE (PF) 100 MCG/2ML IJ SOLN
INTRAMUSCULAR | Status: AC
  Filled 2020-10-21: qty 2

## 2020-10-21 MED ORDER — ENOXAPARIN SODIUM 40 MG/0.4ML IJ SOSY
40.0000 mg | PREFILLED_SYRINGE | INTRAMUSCULAR | Status: DC
  Administered 2020-10-22 – 2020-10-23 (×2): 40 mg via SUBCUTANEOUS
  Filled 2020-10-21 (×2): qty 0.4

## 2020-10-21 MED ORDER — CHLORHEXIDINE GLUCONATE 0.12 % MT SOLN
15.0000 mL | Freq: Once | OROMUCOSAL | Status: AC
  Administered 2020-10-21: 15 mL via OROMUCOSAL

## 2020-10-21 MED ORDER — SODIUM CHLORIDE 0.9 % IV SOLN
2.0000 g | Freq: Two times a day (BID) | INTRAVENOUS | Status: AC
  Administered 2020-10-21: 2 g via INTRAVENOUS
  Filled 2020-10-21: qty 2

## 2020-10-21 MED ORDER — ROCURONIUM BROMIDE 10 MG/ML (PF) SYRINGE
PREFILLED_SYRINGE | INTRAVENOUS | Status: AC
  Filled 2020-10-21: qty 10

## 2020-10-21 MED ORDER — EPHEDRINE SULFATE-NACL 50-0.9 MG/10ML-% IV SOSY
PREFILLED_SYRINGE | INTRAVENOUS | Status: DC | PRN
  Administered 2020-10-21: 5 mg via INTRAVENOUS

## 2020-10-21 MED ORDER — ORAL CARE MOUTH RINSE
15.0000 mL | Freq: Once | OROMUCOSAL | Status: AC
Start: 2020-10-21 — End: 2020-10-21

## 2020-10-21 MED ORDER — ENSURE SURGERY PO LIQD
237.0000 mL | Freq: Two times a day (BID) | ORAL | Status: DC
  Administered 2020-10-22 – 2020-10-23 (×2): 237 mL via ORAL

## 2020-10-21 MED ORDER — ACETAMINOPHEN 500 MG PO TABS
1000.0000 mg | ORAL_TABLET | Freq: Four times a day (QID) | ORAL | Status: DC
  Administered 2020-10-21 – 2020-10-23 (×7): 1000 mg via ORAL
  Filled 2020-10-21 (×8): qty 2

## 2020-10-21 MED ORDER — ALVIMOPAN 12 MG PO CAPS
12.0000 mg | ORAL_CAPSULE | Freq: Two times a day (BID) | ORAL | Status: DC
  Administered 2020-10-22: 12 mg via ORAL
  Filled 2020-10-21: qty 1

## 2020-10-21 MED ORDER — FENTANYL CITRATE (PF) 100 MCG/2ML IJ SOLN
INTRAMUSCULAR | Status: DC | PRN
  Administered 2020-10-21: 50 ug via INTRAVENOUS
  Administered 2020-10-21 (×3): 25 ug via INTRAVENOUS
  Administered 2020-10-21: 50 ug via INTRAVENOUS
  Administered 2020-10-21: 25 ug via INTRAVENOUS

## 2020-10-21 MED ORDER — ALBUMIN HUMAN 5 % IV SOLN
INTRAVENOUS | Status: AC
  Filled 2020-10-21: qty 250

## 2020-10-21 MED ORDER — BOOST / RESOURCE BREEZE PO LIQD CUSTOM
1.0000 | Freq: Three times a day (TID) | ORAL | Status: DC
  Administered 2020-10-21 – 2020-10-23 (×5): 1 via ORAL

## 2020-10-21 MED ORDER — LIDOCAINE HCL 2 % IJ SOLN
INTRAMUSCULAR | Status: AC
  Filled 2020-10-21: qty 20

## 2020-10-21 MED ORDER — DEXAMETHASONE SODIUM PHOSPHATE 10 MG/ML IJ SOLN
INTRAMUSCULAR | Status: DC | PRN
  Administered 2020-10-21: 8 mg via INTRAVENOUS

## 2020-10-21 MED ORDER — PHENYLEPHRINE 40 MCG/ML (10ML) SYRINGE FOR IV PUSH (FOR BLOOD PRESSURE SUPPORT)
PREFILLED_SYRINGE | INTRAVENOUS | Status: DC | PRN
  Administered 2020-10-21: 80 ug via INTRAVENOUS
  Administered 2020-10-21: 40 ug via INTRAVENOUS
  Administered 2020-10-21 (×2): 80 ug via INTRAVENOUS
  Administered 2020-10-21: 40 ug via INTRAVENOUS
  Administered 2020-10-21: 80 ug via INTRAVENOUS

## 2020-10-21 MED ORDER — BUPIVACAINE-EPINEPHRINE 0.25% -1:200000 IJ SOLN
INTRAMUSCULAR | Status: DC | PRN
  Administered 2020-10-21: 30 mL

## 2020-10-21 MED ORDER — LIDOCAINE 2% (20 MG/ML) 5 ML SYRINGE
INTRAMUSCULAR | Status: DC | PRN
  Administered 2020-10-21: 1.5 mg/kg/h via INTRAVENOUS
  Administered 2020-10-21: 60 mg via INTRAVENOUS

## 2020-10-21 MED ORDER — MIDAZOLAM HCL 5 MG/5ML IJ SOLN
INTRAMUSCULAR | Status: DC | PRN
Start: 2020-10-21 — End: 2020-10-21
  Administered 2020-10-21: 1.5 mg via INTRAVENOUS

## 2020-10-21 MED ORDER — PHENYLEPHRINE 40 MCG/ML (10ML) SYRINGE FOR IV PUSH (FOR BLOOD PRESSURE SUPPORT)
PREFILLED_SYRINGE | INTRAVENOUS | Status: AC
  Filled 2020-10-21: qty 10

## 2020-10-21 MED ORDER — 0.9 % SODIUM CHLORIDE (POUR BTL) OPTIME
TOPICAL | Status: DC | PRN
  Administered 2020-10-21: 2000 mL

## 2020-10-21 MED ORDER — AMISULPRIDE (ANTIEMETIC) 5 MG/2ML IV SOLN: 10.0000 mg | Freq: Once | INTRAVENOUS | Status: DC | PRN

## 2020-10-21 MED ORDER — ONDANSETRON HCL 4 MG PO TABS: 4.0000 mg | ORAL_TABLET | Freq: Four times a day (QID) | ORAL | Status: DC | PRN

## 2020-10-21 MED ORDER — BUPIVACAINE-EPINEPHRINE (PF) 0.25% -1:200000 IJ SOLN
INTRAMUSCULAR | Status: AC
  Filled 2020-10-21: qty 30

## 2020-10-21 MED ORDER — PHENYLEPHRINE HCL-NACL 20-0.9 MG/250ML-% IV SOLN
INTRAVENOUS | Status: DC | PRN
  Administered 2020-10-21: 25 ug/min via INTRAVENOUS

## 2020-10-21 MED ORDER — METOPROLOL TARTRATE 12.5 MG HALF TABLET
12.5000 mg | ORAL_TABLET | Freq: Two times a day (BID) | ORAL | Status: DC
  Administered 2020-10-21 – 2020-10-23 (×3): 12.5 mg via ORAL
  Filled 2020-10-21 (×4): qty 1

## 2020-10-21 MED ORDER — INDOCYANINE GREEN 25 MG IV SOLR
INTRAVENOUS | Status: DC | PRN
  Administered 2020-10-21: 7.5 mg via INTRAVENOUS

## 2020-10-21 MED ORDER — BUPIVACAINE LIPOSOME 1.3 % IJ SUSP
INTRAMUSCULAR | Status: DC | PRN
  Administered 2020-10-21: 20 mL

## 2020-10-21 MED ORDER — ALVIMOPAN 12 MG PO CAPS
12.0000 mg | ORAL_CAPSULE | ORAL | Status: AC
  Administered 2020-10-21: 12 mg via ORAL
  Filled 2020-10-21: qty 1

## 2020-10-21 MED ORDER — LACTATED RINGERS IV SOLN: INTRAVENOUS | Status: DC | PRN

## 2020-10-21 MED ORDER — PROPOFOL 10 MG/ML IV BOLUS
INTRAVENOUS | Status: AC
  Filled 2020-10-21: qty 20

## 2020-10-21 MED ORDER — ALBUMIN HUMAN 5 % IV SOLN: INTRAVENOUS | Status: DC | PRN

## 2020-10-21 MED ORDER — SODIUM CHLORIDE 0.9 % IV SOLN
2.0000 g | INTRAVENOUS | Status: AC
  Administered 2020-10-21: 2 g via INTRAVENOUS
  Filled 2020-10-21: qty 2

## 2020-10-21 MED ORDER — SACCHAROMYCES BOULARDII 250 MG PO CAPS
250.0000 mg | ORAL_CAPSULE | Freq: Two times a day (BID) | ORAL | Status: DC
  Administered 2020-10-21 – 2020-10-23 (×4): 250 mg via ORAL
  Filled 2020-10-21 (×4): qty 1

## 2020-10-21 MED ORDER — MIDAZOLAM HCL 2 MG/2ML IJ SOLN
INTRAMUSCULAR | Status: AC
  Filled 2020-10-21: qty 2

## 2020-10-21 MED ORDER — LACTATED RINGERS IV SOLN: INTRAVENOUS | Status: DC

## 2020-10-21 MED ORDER — FENTANYL CITRATE (PF) 100 MCG/2ML IJ SOLN: 25.0000 ug | INTRAMUSCULAR | Status: DC | PRN

## 2020-10-21 MED ORDER — ONDANSETRON HCL 4 MG/2ML IJ SOLN
INTRAMUSCULAR | Status: AC
  Filled 2020-10-21: qty 2

## 2020-10-21 MED ORDER — GABAPENTIN 300 MG PO CAPS
300.0000 mg | ORAL_CAPSULE | ORAL | Status: AC
  Administered 2020-10-21: 300 mg via ORAL
  Filled 2020-10-21: qty 1

## 2020-10-21 MED ORDER — EPHEDRINE 5 MG/ML INJ
INTRAVENOUS | Status: AC
  Filled 2020-10-21: qty 5

## 2020-10-21 MED ORDER — ACETAMINOPHEN 500 MG PO TABS
1000.0000 mg | ORAL_TABLET | ORAL | Status: AC
  Administered 2020-10-21: 1000 mg via ORAL
  Filled 2020-10-21: qty 2

## 2020-10-21 MED ORDER — PHENYLEPHRINE HCL (PRESSORS) 10 MG/ML IV SOLN
INTRAVENOUS | Status: AC
  Filled 2020-10-21: qty 2

## 2020-10-21 SURGICAL SUPPLY — 97 items
ADH SKN CLS APL DERMABOND .7 (GAUZE/BANDAGES/DRESSINGS) ×1
BAG COUNTER SPONGE SURGICOUNT (BAG) ×1 IMPLANT
BAG SPNG CNTER NS LX DISP (BAG)
BLADE EXTENDED COATED 6.5IN (ELECTRODE) IMPLANT
CANNULA REDUC XI 12-8 STAPL (CANNULA) ×2
CANNULA REDUCER 12-8 DVNC XI (CANNULA) IMPLANT
CELLS DAT CNTRL 66122 CELL SVR (MISCELLANEOUS) IMPLANT
COVER SURGICAL LIGHT HANDLE (MISCELLANEOUS) ×3 IMPLANT
COVER TIP SHEARS 8 DVNC (MISCELLANEOUS) ×1 IMPLANT
COVER TIP SHEARS 8MM DA VINCI (MISCELLANEOUS) ×2
DECANTER SPIKE VIAL GLASS SM (MISCELLANEOUS) IMPLANT
DERMABOND ADVANCED (GAUZE/BANDAGES/DRESSINGS) ×1
DERMABOND ADVANCED .7 DNX12 (GAUZE/BANDAGES/DRESSINGS) IMPLANT
DRAIN CHANNEL 19F RND (DRAIN) IMPLANT
DRAPE ARM DVNC X/XI (DISPOSABLE) ×4 IMPLANT
DRAPE COLUMN DVNC XI (DISPOSABLE) ×1 IMPLANT
DRAPE DA VINCI XI ARM (DISPOSABLE) ×8
DRAPE DA VINCI XI COLUMN (DISPOSABLE) ×2
DRAPE SURG IRRIG POUCH 19X23 (DRAPES) ×2 IMPLANT
DRSG OPSITE POSTOP 4X10 (GAUZE/BANDAGES/DRESSINGS) IMPLANT
DRSG OPSITE POSTOP 4X6 (GAUZE/BANDAGES/DRESSINGS) ×1 IMPLANT
DRSG OPSITE POSTOP 4X8 (GAUZE/BANDAGES/DRESSINGS) IMPLANT
ELECT PENCIL ROCKER SW 15FT (MISCELLANEOUS) ×2 IMPLANT
ELECT REM PT RETURN 15FT ADLT (MISCELLANEOUS) ×2 IMPLANT
ENDOLOOP SUT PDS II  0 18 (SUTURE)
ENDOLOOP SUT PDS II 0 18 (SUTURE) IMPLANT
EVACUATOR SILICONE 100CC (DRAIN) IMPLANT
GLOVE SURG ENC MOIS LTX SZ6.5 (GLOVE) ×6 IMPLANT
GLOVE SURG UNDER POLY LF SZ7 (GLOVE) ×4 IMPLANT
GOWN STRL REUS W/TWL XL LVL3 (GOWN DISPOSABLE) ×6 IMPLANT
GRASPER SUT TROCAR 14GX15 (MISCELLANEOUS) ×1 IMPLANT
HOLDER FOLEY CATH W/STRAP (MISCELLANEOUS) ×2 IMPLANT
IRRIG SUCT STRYKERFLOW 2 WTIP (MISCELLANEOUS) ×2
IRRIGATION SUCT STRKRFLW 2 WTP (MISCELLANEOUS) ×1 IMPLANT
KIT PROCEDURE DA VINCI SI (MISCELLANEOUS) ×2
KIT PROCEDURE DVNC SI (MISCELLANEOUS) IMPLANT
KIT TURNOVER KIT A (KITS) ×2 IMPLANT
NDL INSUFFLATION 14GA 120MM (NEEDLE) ×1 IMPLANT
NEEDLE INSUFFLATION 14GA 120MM (NEEDLE) ×2 IMPLANT
PACK CARDIOVASCULAR III (CUSTOM PROCEDURE TRAY) ×2 IMPLANT
PACK COLON (CUSTOM PROCEDURE TRAY) ×2 IMPLANT
PAD POSITIONING PINK XL (MISCELLANEOUS) ×2 IMPLANT
RELOAD STAPLE 60 2.5 WHT DVNC (STAPLE) IMPLANT
RELOAD STAPLE 60 3.5 BLU DVNC (STAPLE) IMPLANT
RELOAD STAPLE 60 4.3 GRN DVNC (STAPLE) IMPLANT
RELOAD STAPLER 2.5X60 WHT DVNC (STAPLE) ×1 IMPLANT
RELOAD STAPLER 3.5X60 BLU DVNC (STAPLE) ×2 IMPLANT
RELOAD STAPLER 4.3X60 GRN DVNC (STAPLE) ×1 IMPLANT
RETRACTOR WND ALEXIS 18 MED (MISCELLANEOUS) IMPLANT
RTRCTR WOUND ALEXIS 18CM MED (MISCELLANEOUS)
SCISSORS LAP 5X35 DISP (ENDOMECHANICALS) IMPLANT
SEAL CANN UNIV 5-8 DVNC XI (MISCELLANEOUS) ×3 IMPLANT
SEAL XI 5MM-8MM UNIVERSAL (MISCELLANEOUS) ×6
SEALER VESSEL DA VINCI XI (MISCELLANEOUS) ×2
SEALER VESSEL EXT DVNC XI (MISCELLANEOUS) ×1 IMPLANT
SOLUTION ELECTROLUBE (MISCELLANEOUS) ×2 IMPLANT
STAPLER 60 DA VINCI SURE FORM (STAPLE) ×2
STAPLER 60 SUREFORM DVNC (STAPLE) IMPLANT
STAPLER CANNULA SEAL DVNC XI (STAPLE) IMPLANT
STAPLER CANNULA SEAL XI (STAPLE) ×2
STAPLER ECHELON POWER CIR 29 (STAPLE) IMPLANT
STAPLER ECHELON POWER CIR 31 (STAPLE) IMPLANT
STAPLER RELOAD 2.5X60 WHITE (STAPLE) ×2
STAPLER RELOAD 2.5X60 WHT DVNC (STAPLE) ×1
STAPLER RELOAD 3.5X60 BLU DVNC (STAPLE) ×2
STAPLER RELOAD 3.5X60 BLUE (STAPLE) ×4
STAPLER RELOAD 4.3X60 GREEN (STAPLE) ×2
STAPLER RELOAD 4.3X60 GRN DVNC (STAPLE) ×1
STOPCOCK 4 WAY LG BORE MALE ST (IV SETS) ×2 IMPLANT
SUT ETHILON 2 0 PS N (SUTURE) IMPLANT
SUT NOVA NAB DX-16 0-1 5-0 T12 (SUTURE) ×4 IMPLANT
SUT PROLENE 2 0 KS (SUTURE) IMPLANT
SUT SILK 2 0 (SUTURE) ×2
SUT SILK 2 0 SH CR/8 (SUTURE) IMPLANT
SUT SILK 2-0 18XBRD TIE 12 (SUTURE) ×1 IMPLANT
SUT SILK 3 0 (SUTURE)
SUT SILK 3 0 SH CR/8 (SUTURE) ×2 IMPLANT
SUT SILK 3-0 18XBRD TIE 12 (SUTURE) IMPLANT
SUT V-LOC BARB 180 2/0GR6 GS22 (SUTURE) ×4
SUT VIC AB 2-0 SH 18 (SUTURE) IMPLANT
SUT VIC AB 2-0 SH 27 (SUTURE)
SUT VIC AB 2-0 SH 27X BRD (SUTURE) IMPLANT
SUT VIC AB 3-0 SH 18 (SUTURE) IMPLANT
SUT VIC AB 4-0 PS2 27 (SUTURE) ×4 IMPLANT
SUT VICRYL 0 UR6 27IN ABS (SUTURE) ×2 IMPLANT
SUTURE V-LC BRB 180 2/0GR6GS22 (SUTURE) IMPLANT
SYR 10ML ECCENTRIC (SYRINGE) ×2 IMPLANT
SYS LAPSCP GELPORT 120MM (MISCELLANEOUS)
SYS WOUND ALEXIS 18CM MED (MISCELLANEOUS)
SYSTEM LAPSCP GELPORT 120MM (MISCELLANEOUS) IMPLANT
SYSTEM WOUND ALEXIS 18CM MED (MISCELLANEOUS) IMPLANT
TOWEL OR 17X26 10 PK STRL BLUE (TOWEL DISPOSABLE) IMPLANT
TOWEL OR NON WOVEN STRL DISP B (DISPOSABLE) ×2 IMPLANT
TRAY FOLEY MTR SLVR 16FR STAT (SET/KITS/TRAYS/PACK) ×1 IMPLANT
TROCAR ADV FIXATION 5X100MM (TROCAR) ×2 IMPLANT
TUBING CONNECTING 10 (TUBING) ×3 IMPLANT
TUBING INSUFFLATION 10FT LAP (TUBING) ×2 IMPLANT

## 2020-10-21 NOTE — Anesthesia Postprocedure Evaluation (Signed)
Anesthesia Post Note  Patient: Alejandra Ford  Procedure(s) Performed: XI ROBOT ASSISTED LAPAROSCOPIC PARTIAL COLECTOMY (Abdomen)     Patient location during evaluation: PACU Anesthesia Type: General Level of consciousness: awake and alert Pain management: pain level controlled Vital Signs Assessment: post-procedure vital signs reviewed and stable Respiratory status: spontaneous breathing, nonlabored ventilation, respiratory function stable and patient connected to nasal cannula oxygen Cardiovascular status: blood pressure returned to baseline and stable Postop Assessment: no apparent nausea or vomiting Anesthetic complications: no   No notable events documented.  Last Vitals:  Vitals:   10/21/20 1557 10/21/20 1809  BP: 118/67 114/66  Pulse: 72 75  Resp: 16 16  Temp: (!) 36.4 C 36.7 C  SpO2: 100% 96%    Last Pain:  Vitals:   10/21/20 1809  TempSrc: Oral  PainSc:                  [name]

## 2020-10-21 NOTE — Op Note (Signed)
10/21/2020  10:48 AM  PATIENT:  Alejandra Ford  75 y.o. female  Patient Care Team: Glenis Smoker, MD as PCP - General (Family Medicine) Buford Dresser, MD as PCP - Cardiology (Cardiology)  PRE-OPERATIVE DIAGNOSIS:  ASCENDING COLON CANCER  POST-OPERATIVE DIAGNOSIS:  ASCENDING COLON CANCER  PROCEDURE:  XI ROBOT ASSISTED RIGHT COLECTOMY INTRAOPERATIVE ASSESSMENT OF VASCULAR PERFUSION  SURGEON:  Surgeon(s): Leighton Ruff, MD Carlena Hurl, PA-C  ASSISTANT: Carlena Hurl, PA   ANESTHESIA:   local and general  EBL: 20 ml Total I/O In: 350 [IV Piggyback:350] Out: 20 [Blood:20]  Delay start of Pharmacological VTE agent (>24hrs) due to surgical blood loss or risk of bleeding:  no  DRAINS: none   SPECIMEN:  Source of Specimen:  R colon  DISPOSITION OF SPECIMEN:  PATHOLOGY  COUNTS:  YES  PLAN OF CARE: Admit to inpatient   PATIENT DISPOSITION:  PACU - hemodynamically stable.  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  I recommended segmental resection:  The anatomy & physiology of the digestive tract was discussed.  The pathophysiology was discussed.  Natural history risks without surgery was discussed.   I worked to give an overview of the disease and the frequent need to have multispecialty involvement.  I feel the risks of no intervention will lead to serious problems that outweigh the operative risks; therefore, I recommended a partial colectomy to remove the pathology.  Laparoscopic & open techniques were discussed.   Risks such as bleeding, infection, abscess, leak, reoperation, possible ostomy, hernia, heart attack, death, and other risks were discussed.  I noted a good likelihood this will help address the problem.   Goals of post-operative recovery were discussed as well.    The patient expressed understanding & wished to proceed with surgery.  OR FINDINGS:   Patient had large mass in RLQ, minimally adherent to the omentum and R  abdominal peritoneum (resected en bloc)  No obvious metastatic disease on visceral parietal peritoneum or liver.  Scarring of the liver without nodularity  DESCRIPTION:   Informed consent was confirmed.  The patient underwent general anaesthesia without difficulty.  The patient was positioned appropriately.  VTE prevention in place.  The patient's abdomen was clipped, prepped, & draped in a sterile fashion.  Surgical timeout confirmed our plan.  The patient was positioned in reverse Trendelenburg.  Abdominal entry was gained using Varies needle in the LUQ.  Entry was clean.  I induced carbon dioxide insufflation.  An 28m robotic port was placed in the LUQ.  Camera inspection revealed no injury.  There were some dense adhesions to the abdominal wall from the omentum.  These were taken down using sharp dissection.  Extra ports were carefully placed under direct laparoscopic visualization.  I laparoscopically reflected the greater omentum and the upper abdomen the small bowel in the peilvis. The patient was appropriately positioned and the robot was docked to the patient's right side.  Instruments were placed under direct visualization.    I began by dividing the omentum over the adhesed area of the tumor to allow for en bloc resection.  This was done using the robotic vessel sealer and the remainder of the omentum was placed in the upper abdomen.  I then divided the peritoneum that was adherent to the tumor in the right lower quadrant.  This freed the colon for mobilization.  I then identified the ileocolic artery and vein within the mesentery. Dissection was bluntly carried around these structures. The duodenum  was identified and free from the structures. I then separated the structures bluntly and used the robotic vessel sealer device to transect these.  I developed the retroperitoneal plane bluntly.  I then freed the appendix off its attachments to the pelvic wall. I mobilized the terminal ileum.  I took  care to avoid injuring any retroperitoneal structures.  After this I began to mobilize laterally down the white line of [place] and then took down the hepatic flexure using the robotic vessel sealer device. I mobilized the omentum off of the right transverse colon. The entire colon was then flipped medially and mobilized off of the retroperitoneal structures until I could visualize the lateral edge of the duodenum underneath.  I gently freed the duodenal attachments.   I identified a portion of mesentery of the transverse colon just proximal to the right branch of the middle colic.  I divided up to the colon from the previous dissection of the mesentery using the robotic vessel sealer.  I then divided the terminal ileal mesentery in similar fashion.  At that point, the terminal ileum was divided with a blue load robotic 60 mm stapler.  The transverse colon was divided with 2 green loads of the robotic stapler.  The specimen was then completely free and placed in the right upper quadrant.  Hemostasis was good.  I then oriented the remaining terminal ileum and transverse colon and a isoperistaltic fashion.  I placed an enterotomy in the small bowel and colon using the robotic scissors.  I then introduced a white load 60 mm robotic stapler into both enterotomies and created an anastomosis between the small bowel and transverse colon.  Hemostasis within the staple line was good.the common enterotomy channel was closed using 2 running 2-0 V-Loc sutures.  The abdomen was then irrigated with normal saline.  Hemostasis was good.  The omentum was then brought down over the anastomosis.  At this point the robot was undocked.  The 12 mm suprapubic port was enlarged to a Pfannenstiel incision and an [place] wound protector was placed.  The specimen was removed from the abdomen and evaluated.  The irrigation was aspirated, and the [place] wound protector was removed.    The peritoneum of the Pfannenstiel incision was closed  using a running 0 Vicryl suture.  The fascia was then closed using interrupted #1 Novafil interrupted sutures.  The subcutaneous tissue of the extraction incision was closed using a running 2-0 Vicryl suture. The skin was then closed using running subcuticular 4-0 Vicryl sutures.  A sterile dressing was applied.  The remaining port sites were closed using interrupted 4-0 Vicryl sutures and Dermabond. All counts were correct per operating room staff. The patient was then awakened from anesthesia and sent to the post anesthesia care unit in stable condition.

## 2020-10-21 NOTE — Anesthesia Procedure Notes (Signed)
Procedure Name: Intubation Date/Time: 10/21/2020 8:40 AM Performed by: Victoriano Lain, CRNA Pre-anesthesia Checklist: Patient identified, Emergency Drugs available, Suction available, Patient being monitored and Timeout performed Patient Re-evaluated:Patient Re-evaluated prior to induction Oxygen Delivery Method: Circle system utilized Preoxygenation: Pre-oxygenation with 100% oxygen Induction Type: IV induction Ventilation: Mask ventilation without difficulty Laryngoscope Size: Mac and 3 Grade View: Grade I Tube type: Oral Tube size: 7.0 mm Number of attempts: 1 Airway Equipment and Method: Stylet Placement Confirmation: ETT inserted through vocal cords under direct vision, positive ETCO2 and breath sounds checked- equal and bilateral Secured at: 21 cm Tube secured with: Tape Dental Injury: Teeth and Oropharynx as per pre-operative assessment

## 2020-10-21 NOTE — Interval H&P Note (Signed)
History and Physical Interval Note:  10/21/2020 7:48 AM  Alejandra Ford  has presented today for surgery, with the diagnosis of ASCENDING COLON CANCER.  The various methods of treatment have been discussed with the patient and family. After consideration of risks, benefits and other options for treatment, the patient has consented to  Procedure(s) with comments: XI ROBOT ASSISTED LAPAROSCOPIC PARTIAL COLECTOMY (N/A) - 2.5 HRS as a surgical intervention.  The patient's history has been reviewed, patient examined, no change in status, stable for surgery.  I have reviewed the patient's chart and labs.  Questions were answered to the patient's satisfaction.     Rosario Adie

## 2020-10-21 NOTE — Anesthesia Procedure Notes (Signed)
Arterial Line Insertion Start/End8/17/2022 8:02 AM Performed by: Victoriano Lain, CRNA, CRNA  Preanesthetic checklist: patient identified, IV checked, site marked, risks and benefits discussed, surgical consent, monitors and equipment checked, pre-op evaluation, timeout performed and anesthesia consent Lidocaine 1% used for infiltration and patient sedated Left, radial was placed Catheter size: 20 G Hand hygiene performed  and maximum sterile barriers used  Allen's test indicative of satisfactory collateral circulation Attempts: 1 Procedure performed without using ultrasound guided technique. Following insertion, dressing applied and Biopatch. Post procedure assessment: normal  Patient tolerated the procedure well with no immediate complications.

## 2020-10-21 NOTE — Transfer of Care (Signed)
Immediate Anesthesia Transfer of Care Note  Patient: Alejandra Ford  Procedure(s) Performed: XI ROBOT ASSISTED LAPAROSCOPIC PARTIAL COLECTOMY (Abdomen)  Patient Location: PACU  Anesthesia Type:General  Level of Consciousness: awake, alert , oriented and patient cooperative  Airway & Oxygen Therapy: Patient Spontanous Breathing and Patient connected to face mask oxygen  Post-op Assessment: Report given to RN, Post -op Vital signs reviewed and stable and Patient moving all extremities  Post vital signs: Reviewed and stable  Last Vitals:  Vitals Value Taken Time  BP 146/65 10/21/20 1109  Temp    Pulse 85 10/21/20 1111  Resp 11 10/21/20 1111  SpO2 100 % 10/21/20 1111  Vitals shown include unvalidated device data.  Last Pain:  Vitals:   10/21/20 0703  TempSrc:   PainSc: 0-No pain         Complications: No notable events documented.

## 2020-10-21 NOTE — Interval H&P Note (Signed)
History and Physical Interval Note:  10/21/2020 7:49 AM  Alejandra Ford  has presented today for surgery, with the diagnosis of ASCENDING COLON CANCER.  The various methods of treatment have been discussed with the patient and family. After consideration of risks, benefits and other options for treatment, the patient has consented to  Procedure(s) with comments: XI ROBOT ASSISTED LAPAROSCOPIC PARTIAL COLECTOMY (N/A) - 2.5 HRS as a surgical intervention.  The patient's history has been reviewed, patient examined, no change in status, stable for surgery.  I have reviewed the patient's chart and labs.  Questions were answered to the patient's satisfaction.     Rosario Adie, MD  [facility]

## 2020-10-22 LAB — CBC
HCT: 23 % — ABNORMAL LOW (ref 36.0–46.0)
Hemoglobin: 7.2 g/dL — ABNORMAL LOW (ref 12.0–15.0)
MCH: 24.6 pg — ABNORMAL LOW (ref 26.0–34.0)
MCHC: 31.3 g/dL (ref 30.0–36.0)
MCV: 78.5 fL — ABNORMAL LOW (ref 80.0–100.0)
Platelets: 396 10*3/uL (ref 150–400)
RBC: 2.93 MIL/uL — ABNORMAL LOW (ref 3.87–5.11)
RDW: 15.8 % — ABNORMAL HIGH (ref 11.5–15.5)
WBC: 13.3 10*3/uL — ABNORMAL HIGH (ref 4.0–10.5)
nRBC: 0 % (ref 0.0–0.2)

## 2020-10-22 LAB — BASIC METABOLIC PANEL
Anion gap: 7 (ref 5–15)
BUN: 6 mg/dL — ABNORMAL LOW (ref 8–23)
CO2: 25 mmol/L (ref 22–32)
Calcium: 8.7 mg/dL — ABNORMAL LOW (ref 8.9–10.3)
Chloride: 102 mmol/L (ref 98–111)
Creatinine, Ser: 0.68 mg/dL (ref 0.44–1.00)
GFR, Estimated: >60 mL/min (ref >60)
Glucose, Bld: 157 mg/dL — ABNORMAL HIGH (ref 70–99)
Potassium: 3.6 mmol/L (ref 3.5–5.1)
Sodium: 134 mmol/L — ABNORMAL LOW (ref 135–145)

## 2020-10-22 MED ORDER — CHLORHEXIDINE GLUCONATE CLOTH 2 % EX PADS
6.0000 | MEDICATED_PAD | Freq: Every day | CUTANEOUS | Status: DC
  Administered 2020-10-22 – 2020-10-23 (×2): 6 via TOPICAL

## 2020-10-22 NOTE — Progress Notes (Signed)
1 Day Post-Op Robotic assisted R colectomy Subjective: Had several BM's, tolerating fulls, having some shoulder pain  Objective: Vital signs in last 24 hours: Temp:  [97.3 F (36.3 C)-98.1 F (36.7 C)] 97.3 F (36.3 C) (08/18 0500) Pulse Rate:  [68-93] 72 (08/18 0500) Resp:  [9-24] 18 (08/18 0500) BP: (102-147)/(55-71) 107/59 (08/18 0500) SpO2:  [90 %-100 %] 96 % (08/18 0500) Arterial Line BP: (143-174)/(57-82) 143/82 (08/17 1130) Weight:  [52.4 kg] 52.4 kg (08/18 0500)   Intake/Output from previous day: 08/17 0701 - 08/18 0700 In: 3741.2 [P.O.:460; I.V.:2929.1; IV Piggyback:352.1] Out: [id number] [Urine:850; Blood:20] Intake/Output this shift: No intake/output data recorded.   General appearance: alert and cooperative GI: normal findings: soft, non-distended  Incision: no significant drainage  Lab Results:  Recent Labs    10/22/20 0522  WBC 13.3*  HGB 7.2*  HCT 23.0*  PLT 396   BMET Recent Labs    10/22/20 0522  NA 134*  K 3.6  CL 102  CO2 25  GLUCOSE 157*  BUN 6*  CREATININE 0.68  CALCIUM 8.7*   PT/INR No results for input(s): LABPROT, INR in the last 72 hours. ABG No results for input(s): PHART, HCO3 in the last 72 hours.  Invalid input(s): PCO2, PO2  MEDS, Scheduled  acetaminophen  1,000 mg Oral Q6H   alvimopan  12 mg Oral BID   Chlorhexidine Gluconate Cloth  6 each Topical Daily   enoxaparin (LOVENOX) injection  40 mg Subcutaneous Q24H   feeding supplement  1 Container Oral TID BM   feeding supplement  237 mL Oral BID BM   metoprolol tartrate  12.5 mg Oral BID   saccharomyces boulardii  250 mg Oral BID    Studies/Results: No results found.  Assessment: s/p Procedure(s): XI ROBOT ASSISTED LAPAROSCOPIC PARTIAL COLECTOMY Patient Active Problem List   Diagnosis Date Noted   Colon cancer, ascending ([place]) 10/21/2020   Protein-calorie malnutrition, severe 09/09/2020   NSTEMI (non-ST elevated myocardial infarction) ([place]) 09/08/2020   Essential  hypertension 09/08/2020   Anemia 09/08/2020   Colonic mass 09/08/2020   Unintended weight loss 09/08/2020   Hyperlipidemia 09/08/2020   Hyponatremia 09/08/2020   Leucocytosis 09/08/2020   PAD (peripheral artery disease) ([place]) 09/08/2020   S/P femoral-popliteal bypass surgery 09/08/2020   H/O aorta-iliac-femoral bypass 09/08/2020   Smoker 09/08/2020    Expected post op course Chronic blood loss anemia with acute blood loss anemia  Plan: d/c foley Decrease IVF's Ambulate in hall Advance to soft foods   LOS: 1 day     .Rosario Adie, MD [facility], [place]    10/22/2020 8:27 AM

## 2020-10-22 NOTE — Progress Notes (Signed)
   10/22/20 1000  Mobility  Activity Ambulated in hall;Transferred:  Bed to chair;Dangled on edge of bed;Sat and stood x 3  Level of Assistance Standby assist, set-up cues, supervision of patient - no hands on  Assistive Device None  Distance Ambulated (ft) 5 ft  Mobility Ambulated with assistance in room  Mobility Response Tolerated well  Mobility performed by Mobility specialist  $Mobility charge 1 Mobility   Upon entering, pt stated she was having some chest pain. She agreed to sit EOB and then transfer to the chair. She requested to brush her teeth while EOB. Afterwards, pt stood and ambulated to chair. Once at the chair, conducted 5 stand to sit exercises. Paused for a couple minutes afterwards to catch her breath and then conducted another round on 5 stand to sits. Left pt in chair with call bell at side and husband present. RN notified of session.    [place] [facility] Office: [phone]

## 2020-10-22 NOTE — Progress Notes (Signed)
Initial Nutrition Assessment  DOCUMENTATION CODES:   Non-severe (moderate) malnutrition in context of chronic illness  INTERVENTION:   -Boost Breeze po TID, each supplement provides 250 kcal and 9 grams of protein  -Ensure Surgery PO BID, each provides 330 kcals and 18g protein  NUTRITION DIAGNOSIS:   Moderate Malnutrition related to chronic illness, cancer and cancer related treatments as evidenced by energy intake < or equal to 75% for > or equal to 1 month, moderate fat depletion, moderate muscle depletion.  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  Patient is a smoker with past medical history of peripheral artery disease status post left femoral-femoral bypass and aortoiliac bypass who was admitted in early July for NSTEMI.  8/17: s/p partial colectomy  Patient in room with husband at bedside. Was just cleaned up after having some loose stools. States for the last couple of months she was dealing with constipation. Was unable to eat much as she would get full pretty quickly with meals. Now her stools are looser following surgery.  Pt is tolerating full liquids and diet to be advanced to soft today.   Pt states she likes the Boost and Ensure supplements so will keep both ordered for now.   Per pt and husband, pt has lost around 20 lbs over the past 6 months.   Medications: Florastor, D5 infusion  Labs reviewed: Low Na   NUTRITION - FOCUSED PHYSICAL EXAM:  Flowsheet Row Most Recent Value  Orbital Region No depletion  Upper Arm Region Moderate depletion  Thoracic and Lumbar Region Unable to assess  Buccal Region Moderate depletion  Temple Region Moderate depletion  Clavicle Bone Region Moderate depletion  Clavicle and Acromion Bone Region  Moderate depletion  Scapular Bone Region Unable to assess  Dorsal Hand Moderate depletion  Patellar Region Moderate depletion  Anterior Thigh Region Unable to assess  Posterior Calf Region Moderate depletion  Edema (RD Assessment) None  Hair Reviewed  Eyes Reviewed  Mouth Reviewed       Diet Order:   Diet Order             DIET SOFT Room service appropriate? Yes; Fluid consistency: Thin  Diet effective now                   EDUCATION NEEDS:   No education needs have been identified at this time  Skin:  Skin Assessment: Skin Integrity Issues: Skin Integrity Issues:: Incisions Incisions: 8/17 abdomen  Last BM:  8/18 -type 6  Height:   Ht Readings from Last 1 Encounters:  10/21/20 5' 3.5" (1.613 m)    Weight:   Wt Readings from Last 1 Encounters:  10/22/20 52.4 kg    BMI:  Body mass index is 20.14 kg/m.  Estimated Nutritional Needs:   Kcal:  1550-1750  Protein:  65-80g  Fluid:  1.7L/day   Clayton Bibles, MS, RD, LDN Inpatient Clinical Dietitian Contact information available via Amion

## 2020-10-23 ENCOUNTER — Other Ambulatory Visit: Payer: Self-pay

## 2020-10-23 DIAGNOSIS — E440 Moderate protein-calorie malnutrition: Secondary | ICD-10-CM | POA: Insufficient documentation

## 2020-10-23 LAB — CBC
HCT: 24.5 % — ABNORMAL LOW (ref 36.0–46.0)
Hemoglobin: 7.6 g/dL — ABNORMAL LOW (ref 12.0–15.0)
MCH: 24.7 pg — ABNORMAL LOW (ref 26.0–34.0)
MCHC: 31 g/dL (ref 30.0–36.0)
MCV: 79.5 fL — ABNORMAL LOW (ref 80.0–100.0)
Platelets: 427 10*3/uL — ABNORMAL HIGH (ref 150–400)
RBC: 3.08 MIL/uL — ABNORMAL LOW (ref 3.87–5.11)
RDW: 16 % — ABNORMAL HIGH (ref 11.5–15.5)
WBC: 12.9 10*3/uL — ABNORMAL HIGH (ref 4.0–10.5)
nRBC: 0 % (ref 0.0–0.2)

## 2020-10-23 LAB — BASIC METABOLIC PANEL
Anion gap: 5 (ref 5–15)
BUN: 9 mg/dL (ref 8–23)
CO2: 27 mmol/L (ref 22–32)
Calcium: 8.7 mg/dL — ABNORMAL LOW (ref 8.9–10.3)
Chloride: 103 mmol/L (ref 98–111)
Creatinine, Ser: 0.7 mg/dL (ref 0.44–1.00)
GFR, Estimated: >60 mL/min (ref >60)
Glucose, Bld: 91 mg/dL (ref 70–99)
Potassium: 3.9 mmol/L (ref 3.5–5.1)
Sodium: 135 mmol/L (ref 135–145)

## 2020-10-23 MED ORDER — FERROUS SULFATE 325 (65 FE) MG PO TABS: 325.0000 mg | ORAL_TABLET | Freq: Every day | ORAL | 0 refills | Status: DC

## 2020-10-23 MED ORDER — TRAMADOL HCL 50 MG PO TABS: 50.0000 mg | ORAL_TABLET | Freq: Four times a day (QID) | ORAL | 0 refills | Status: DC | PRN

## 2020-10-23 NOTE — Discharge Summary (Signed)
Physician Discharge Summary  Patient ID: Alejandra Ford MRN: [id number] DOB/AGE: 74-Oct-1947 74 y.o.  Admit date: 10/21/2020 Discharge date: 10/23/2020  Admission Diagnoses:  Discharge Diagnoses:  Active Problems:   Colon cancer, ascending (HCC)   Malnutrition of moderate degree   Discharged Condition: stable  Hospital Course: 75 year old female with ascending colon cancer.  She underwent a robotic assisted right colectomy.  Her diet was advanced as tolerated.  By postop day 2 she was tolerating a diet and having good bowel function.  She was ambulating without difficulty.  Her pain was controlled with oral medications and she was felt to be in stable condition for discharge to home.  Consults: None  Significant Diagnostic Studies: labs: cbc, bmet  Treatments: IV hydration, analgesia: acetaminophen, and surgery: robotic R colectomy  Discharge Exam: Blood pressure 113/64, pulse 84, temperature 98.2 F (36.8 C), temperature source Oral, resp. rate 20, height 5' 3.5" (1.613 m), weight 51.6 kg, SpO2 96 %. General appearance: alert and cooperative GI: soft, nondistended Incision/Wound: clean, dry, intact  Disposition: home   Allergies as of 10/23/2020       Reactions   Trazodone Anxiety   Nightmares        Medication List     TAKE these medications    acetaminophen 500 MG tablet Commonly known as: TYLENOL Take 500 mg by mouth every 6 (six) hours as needed for moderate pain.   aspirin EC 81 MG tablet Take 81 mg by mouth daily. Swallow whole.   atorvastatin 80 MG tablet Commonly known as: LIPITOR Take 1 tablet (80 mg total) by mouth every evening.   betamethasone dipropionate 0.05 % cream Apply 1 application topically daily as needed (eczema on feet).   ferrous sulfate 325 (65 FE) MG tablet Take 1 tablet (325 mg total) by mouth daily with breakfast.   ibuprofen 200 MG tablet Commonly known as: ADVIL Take 200 mg by mouth every 6 (six) hours as needed for moderate  pain.   metoprolol tartrate 25 MG tablet Commonly known as: LOPRESSOR Take 0.5 tablets (12.5 mg total) by mouth 2 (two) times daily.   nitroGLYCERIN 0.4 MG SL tablet Commonly known as: NITROSTAT Place 1 tablet (0.4 mg total) under the tongue every 5 (five) minutes x 3 doses as needed for chest pain.   polyethylene glycol 17 g packet Commonly known as: MIRALAX / GLYCOLAX Take 17 g by mouth daily as needed for moderate constipation.   traMADol 50 MG tablet Commonly known as: ULTRAM Take 1-2 tablets (50-100 mg total) by mouth every 6 (six) hours as needed.   Vitamin D 50 MCG (2000 UT) Caps Take 2,000 Units by mouth at bedtime.        Follow-up Information     Leighton Ruff, MD. Schedule an appointment as soon as possible for a visit in 2 week(s).   Specialties: General Surgery, Colon and Rectal Surgery Contact information: [place] [address] [phone]                 Signed: Rosario Adie [id number], 7:41 AM

## 2020-10-23 NOTE — Discharge Instructions (Signed)
SURGERY: POST OP INSTRUCTIONS (Surgery for small bowel obstruction, colon resection, etc)   ######################################################################  EAT Gradually transition to a high fiber diet with a fiber supplement over the next few days after discharge  WALK Walk an hour a day.  Control your pain to do that.    CONTROL PAIN Control pain so that you can walk, sleep, tolerate sneezing/coughing, go up/down stairs.  HAVE A BOWEL MOVEMENT DAILY Keep your bowels regular to avoid problems.  OK to try a laxative to override constipation.  OK to use an antidairrheal to slow down diarrhea.  Call if not better after 2 tries  CALL IF YOU HAVE PROBLEMS/CONCERNS Call if you are still struggling despite following these instructions. Call if you have concerns not answered by these instructions  ######################################################################   DIET Follow a light diet the first few days at home.  Start with a bland diet such as soups, liquids, starchy foods, low fat foods, etc.  If you feel full, bloated, or constipated, stay on a ful liquid or pureed/blenderized diet for a few days until you feel better and no longer constipated. Be sure to drink plenty of fluids every day to avoid getting dehydrated (feeling dizzy, not urinating, etc.). Gradually add a fiber supplement to your diet over the next week.  Gradually get back to a regular solid diet.  Avoid fast food or heavy meals the first week as you are more likely to get nauseated. It is expected for your digestive tract to need a few months to get back to normal.  It is common for your bowel movements and stools to be irregular.  You will have occasional bloating and cramping that should eventually fade away.  Until you are eating solid food normally, off all pain medications, and back to regular activities; your bowels will not be normal. Focus on eating a low-fat, high fiber diet the rest of your life  (See Getting to Good Bowel Health, below).  CARE of your INCISION or WOUND  It is good for closed incisions and even open wounds to be washed every day.  Shower every day.  Short baths are fine.  Wash the incisions and wounds clean with soap & water.    You may leave closed incisions open to air if it is dry.   You may cover the incision with clean gauze & replace it after your daily shower for comfort.  STAPLES: You have skin staples.  Leave them in place & set up an appointment for them to be removed by a surgery office nurse ~10 days after surgery. = 1st week of January 2024    ACTIVITIES as tolerated Start light daily activities --- self-care, walking, climbing stairs-- beginning the day after surgery.  Gradually increase activities as tolerated.  Control your pain to be active.  Stop when you are tired.  Ideally, walk several times a day, eventually an hour a day.   Most people are back to most day-to-day activities in a few weeks.  It takes 4-8 weeks to get back to unrestricted, intense activity. If you can walk 30 minutes without difficulty, it is safe to try more intense activity such as jogging, treadmill, bicycling, low-impact aerobics, swimming, etc. Save the most intensive and strenuous activity for last (Usually 4-8 weeks after surgery) such as sit-ups, heavy lifting, contact sports, etc.  Refrain from any intense heavy lifting or straining until you are off narcotics for pain control.  You will have off days, but things should improve   week-by-week. DO NOT PUSH THROUGH PAIN.  Let pain be your guide: If it hurts to do something, don't do it.  Pain is your body warning you to avoid that activity for another week until the pain goes down. You may drive when you are no longer taking narcotic prescription pain medication, you can comfortably wear a seatbelt, and you can safely make sudden turns/stops to protect yourself without hesitating due to pain. You may have sexual intercourse when it  is comfortable. If it hurts to do something, stop.  MEDICATIONS Take your usually prescribed home medications unless otherwise directed.   Blood thinners:  Usually you can restart any strong blood thinners after the second postoperative day.  It is OK to take aspirin right away.     If you are on strong blood thinners (warfarin/Coumadin, Plavix, Xerelto, Eliquis, Pradaxa, etc), discuss with your surgeon, medicine PCP, and/or cardiologist for instructions on when to restart the blood thinner & if blood monitoring is needed (PT/INR blood check, etc).     PAIN CONTROL Pain after surgery or related to activity is often due to strain/injury to muscle, tendon, nerves and/or incisions.  This pain is usually short-term and will improve in a few months.  To help speed the process of healing and to get back to regular activity more quickly, DO THE FOLLOWING THINGS TOGETHER: Increase activity gradually.  DO NOT PUSH THROUGH PAIN Use Ice and/or Heat Try Gentle Massage and/or Stretching Take over the counter pain medication Take Narcotic prescription pain medication for more severe pain  Good pain control = faster recovery.  It is better to take more medicine to be more active than to stay in bed all day to avoid medications.  Increase activity gradually Avoid heavy lifting at first, then increase to lifting as tolerated over the next 6 weeks. Do not "push through" the pain.  Listen to your body and avoid positions and maneuvers than reproduce the pain.  Wait a few days before trying something more intense Walking an hour a day is encouraged to help your body recover faster and more safely.  Start slowly and stop when getting sore.  If you can walk 30 minutes without stopping or pain, you can try more intense activity (running, jogging, aerobics, cycling, swimming, treadmill, sex, sports, weightlifting, etc.) Remember: If it hurts to do it, then don't do it! Use Ice and/or Heat You will have swelling and  bruising around the incisions.  This will take several weeks to resolve. Ice packs or heating pads (6-8 times a day, 30-60 minutes at a time) will help sooth soreness & bruising. Some people prefer to use ice alone, heat alone, or alternate between ice & heat.  Experiment and see what works best for you.  Consider trying ice for the first few days to help decrease swelling and bruising; then, switch to heat to help relax sore spots and speed recovery. Shower every day.  Short baths are fine.  It feels good!  Keep the incisions and wounds clean with soap & water.   Try Gentle Massage and/or Stretching Massage at the area of pain many times a day Stop if you feel pain - do not overdo it Take over the counter pain medication This helps the muscle and nerve tissues become less irritable and calm down faster Choose ONE of the following over-the-counter anti-inflammatory medications: Acetaminophen 500mg tabs (Tylenol) 1-2 pills with every meal and just before bedtime (avoid if you have liver problems or if you have   acetaminophen in you narcotic prescription) Naproxen 220mg tabs (ex. Aleve, Naprosyn) 1-2 pills twice a day (avoid if you have kidney, stomach, IBD, or bleeding problems) Ibuprofen 200mg tabs (ex. Advil, Motrin) 3-4 pills with every meal and just before bedtime (avoid if you have kidney, stomach, IBD, or bleeding problems) Take with food/snack several times a day as directed for at least 2 weeks to help keep pain / soreness down & more manageable. Take Narcotic prescription pain medication for more severe pain A prescription for strong pain control is often given to you upon discharge (for example: oxycodone/Percocet, hydrocodone/Norco/Vicodin, or tramadol/Ultram) Take your pain medication as prescribed. Be mindful that most narcotic prescriptions contain Tylenol (acetaminophen) as well - avoid taking too much Tylenol. If you are having problems/concerns with the prescription medicine (does  not control pain, nausea, vomiting, rash, itching, etc.), please call us [phone] to see if we need to switch you to a different pain medicine that will work better for you and/or control your side effects better. If you need a refill on your pain medication, you must call the office before 4 pm and on weekdays only.  By federal law, prescriptions for narcotics cannot be called into a pharmacy.  They must be filled out on paper & picked up from our office by the patient or authorized caretaker.  Prescriptions cannot be filled after 4 pm nor on weekends.    WHEN TO CALL US [phone] Severe uncontrolled or worsening pain  Fever over 101 F (38.5 C) Concerns with the incision: Worsening pain, redness, rash/hives, swelling, bleeding, or drainage Reactions / problems with new medications (itching, rash, hives, nausea, etc.) Nausea and/or vomiting Difficulty urinating Difficulty breathing Worsening fatigue, dizziness, lightheadedness, blurred vision Other concerns If you are not getting better after two weeks or are noticing you are getting worse, contact our office [phone] for further advice.  We may need to adjust your medications, re-evaluate you in the office, send you to the emergency room, or see what other things we can do to help. The clinic staff is available to answer your questions during regular business hours (8:30am-5pm).  Please don't hesitate to call and ask to speak to one of our nurses for clinical concerns.    A surgeon from [facility] is always on call at the hospitals 24 hours/day If you have a medical emergency, go to the nearest emergency room or call 911.  FOLLOW UP in our office One the day of your discharge from the hospital (or the next business weekday), please call [facility] to set up or confirm an appointment to see your surgeon in the office for a follow-up appointment.  Usually it is 2-3 weeks after your surgery.   If you  have skin staples at your incision(s), let the office know so we can set up a time in the office for the nurse to remove them (usually around 10 days after surgery). Make sure that you call for appointments the day of discharge (or the next business weekday) from the hospital to ensure a convenient appointment time. IF YOU HAVE DISABILITY OR FAMILY LEAVE FORMS, BRING THEM TO THE OFFICE FOR PROCESSING.  DO NOT GIVE THEM TO YOUR DOCTOR.  [facility] [address], [address], [place], [place]  [address] ? [phone] - Main [phone] - Toll Free,  [phone] - Fax [email or web address]    GETTING TO GOOD BOWEL HEALTH. It is expected for your digestive tract to   need a few months to get back to normal.  It is common for your bowel movements and stools to be irregular.  You will have occasional bloating and cramping that should eventually fade away.  Until you are eating solid food normally, off all pain medications, and back to regular activities; your bowels will not be normal.   Avoiding constipation The goal: ONE SOFT BOWEL MOVEMENT A DAY!    Drink plenty of fluids.  Choose water first. TAKE A FIBER SUPPLEMENT EVERY DAY THE REST OF YOUR LIFE During your first week back home, gradually add back a fiber supplement every day Experiment which form you can tolerate.   There are many forms such as powders, tablets, wafers, gummies, etc Psyllium bran (Metamucil), methylcellulose (Citrucel), Miralax or Glycolax, Benefiber, Flax Seed.  Adjust the dose week-by-week (1/2 dose/day to 6 doses a day) until you are moving your bowels 1-2 times a day.  Cut back the dose or try a different fiber product if it is giving you problems such as diarrhea or bloating. Sometimes a laxative is needed to help jump-start bowels if constipated until the fiber supplement can help regulate your bowels.  If you are tolerating eating & you are farting, it is okay to try a gentle  laxative such as double dose MiraLax, prune juice, or Milk of Magnesia.  Avoid using laxatives too often. Stool softeners can sometimes help counteract the constipating effects of narcotic pain medicines.  It can also cause diarrhea, so avoid using for too long. If you are still constipated despite taking fiber daily, eating solids, and a few doses of laxatives, call our office. Controlling diarrhea Try drinking liquids and eating bland foods for a few days to avoid stressing your intestines further. Avoid dairy products (especially milk & ice cream) for a short time.  The intestines often can lose the ability to digest lactose when stressed. Avoid foods that cause gassiness or bloating.  Typical foods include beans and other legumes, cabbage, broccoli, and dairy foods.  Avoid greasy, spicy, fast foods.  Every person has some sensitivity to other foods, so listen to your body and avoid those foods that trigger problems for you. Probiotics (such as active yogurt, Align, etc) may help repopulate the intestines and colon with normal bacteria and calm down a sensitive digestive tract Adding a fiber supplement gradually can help thicken stools by absorbing excess fluid and retrain the intestines to act more normally.  Slowly increase the dose over a few weeks.  Too much fiber too soon can backfire and cause cramping & bloating. It is okay to try and slow down diarrhea with a few doses of antidiarrheal medicines.   Bismuth subsalicylate (ex. Kayopectate, Pepto Bismol) for a few doses can help control diarrhea.  Avoid if pregnant.   Loperamide (Imodium) can slow down diarrhea.  Start with one tablet (2mg) first.  Avoid if you are having fevers or severe pain.  ILEOSTOMY PATIENTS WILL HAVE CHRONIC DIARRHEA since their colon is not in use.    Drink plenty of liquids.  You will need to drink even more glasses of water/liquid a day to avoid getting dehydrated. Record output from your ileostomy.  Expect to empty  the bag every 3-4 hours at first.  Most people with a permanent ileostomy empty their bag 4-6 times at the least.   Use antidiarrheal medicine (especially Imodium) several times a day to avoid getting dehydrated.  Start with a dose at bedtime & breakfast.  Adjust up or   down as needed.  Increase antidiarrheal medications as directed to avoid emptying the bag more than 8 times a day (every 3 hours). Work with your wound ostomy nurse to learn care for your ostomy.  See ostomy care instructions. TROUBLESHOOTING IRREGULAR BOWELS 1) Start with a soft & bland diet. No spicy, greasy, or fried foods.  2) Avoid gluten/wheat or dairy products from diet to see if symptoms improve. 3) Miralax 17gm or flax seed mixed in 8oz. water or juice-daily. May use 2-4 times a day as needed. 4) Gas-X, Phazyme, etc. as needed for gas & bloating.  5) Prilosec (omeprazole) over-the-counter as needed 6)  Consider probiotics (Align, Activa, etc) to help calm the bowels down  Call your doctor if you are getting worse or not getting better.  Sometimes further testing (cultures, endoscopy, X-ray studies, CT scans, bloodwork, etc.) may be needed to help diagnose and treat the cause of the diarrhea. [facility] [address], [address], [place], [place]  [address] [phone] - Main.    [phone]  - Toll Free.   [phone] - Fax [email or web address]   ###############################   #######################################################  Ostomy Support Information  You've heard that people get along just fine with only one of their eyes, or one of their lungs, or one of their kidneys. But you also know that you have only one intestine and only one bladder, and that leaves you feeling awfully empty, both physically and emotionally: You think no other people go around without part of their intestine with the ends of their intestines sticking out through their abdominal walls.    YOU ARE NOT ALONE.  There are nearly three quarters of a million people in the [place] who have an ostomy; people who have had surgery to remove all or part of their colons or bladders.   There is even a national association, the [employer] with over 350 local affiliated support groups that are organized by volunteers who provide peer support and counseling. [employer] has a toll free telephone num-ber, 800-826-0826 and an educational, interactive website, www.ostomy.org   An ostomy is an opening in the belly (abdominal wall) made by surgery. Ostomates are people who have had this procedure. The opening (stoma) allows the kidney or bowel to grdischarge waste. An external pouch covers the stoma to collect waste. Pouches are are a simple bag and are odor free. Different companies have disposable or reusable pouches to fit one's lifestyle. An ostomy can either be temporary or permanent.   THERE ARE THREE MAIN TYPES OF OSTOMIES Colostomy. A colostomy is a surgically created opening in the large intestine (colon). Ileostomy. An ileostomy is a surgically created opening in the small intestine. Urostomy. A urostomy is a surgically created opening to divert urine away from the bladder.  OSTOMY Care  The following guidelines will make care of your colostomy easier. Keep this information close by for quick reference.  Helpful DIET hints Eat a well-balanced diet including vegetables and fresh fruits. Eat on a regular schedule.  Drink at least 6 to 8 glasses of fluids daily. Eat slowly in a relaxed atmosphere. Chew your food thoroughly. Avoid chewing gum, smoking, and drinking from a straw. This will help decrease the amount of air you swallow, which may help reduce gas. Eating yogurt or drinking buttermilk may help reduce gas.  To control gas at night, do not eat after 8 p.m. This will give your bowel time to quiet down before you go   to bed.  If gas is a problem, you can purchase  Beano. Sprinkle Beano on the first bite of food before eating to reduce gas. It has no flavor and should not change the taste of your food. You can buy Beano over the counter at your local drugstore.  Foods like fish, onions, garlic, broccoli, asparagus, and cabbage produce odor. Although your pouch is odor-proof, if you eat these foods you may notice a stronger odor when emptying your pouch. If this is a concern, you may want to limit these foods in your diet.  If you have an ileostomy, you will have chronic diarrhea & need to drink more liquids to avoid getting dehydrated.  Consider antidiarrheal medicine like imodium (loperamide) or Lomotil to help slow down bowel movements / diarrhea into your ileostomy bag.  GETTING TO GOOD BOWEL HEALTH WITH AN ILEOSTOMY    With the colon bypassed & not in use, you will have small bowel diarrhea.   It is important to thicken & slow your bowel movements down.   The goal: 4-6 small BOWEL MOVEMENTS A DAY It is important to drink plenty of liquids to avoid getting dehydrated  CONTROLLING ILEOSTOMY DIARRHEA  TAKE A FIBER SUPPLEMENT (FiberCon or Benefiner soluble fiber) twice a day - to thicken stools by absorbing excess fluid and retrain the intestines to act more normally.  Slowly increase the dose over a few weeks.  Too much fiber too soon can backfire and cause cramping & bloating.  TAKE AN IRON SUPPLEMENT twice a day to naturally constipate your bowels.  Usually ferrous sulfate 325mg twice a day)  TAKE ANTI-DIARRHEAL MEDICINES: Loperamide (Imodium) can slow down diarrhea.  Start with two tablets (= 4mg) first and then try one tablet every 6 hours.  Can go up to 2 pills four times day (8 pills of 2mg max) Avoid if you are having fevers or severe pain.  If you are not better or start feeling worse, stop all medicines and call your doctor for advice LoMotil (Diphenoxylate / Atropine) is another medicine that can constipate & slow down bowel moevements Pepto  Bismol (bismuth) can gently thicken bowels as well  If diarrhea is worse,: drink plenty of liquids and try simpler foods for a few days to avoid stressing your intestines further. Avoid dairy products (especially milk & ice cream) for a short time.  The intestines often can lose the ability to digest lactose when stressed. Avoid foods that cause gassiness or bloating.  Typical foods include beans and other legumes, cabbage, broccoli, and dairy foods.  Every person has some sensitivity to other foods, so listen to our body and avoid those foods that trigger problems for you.Call your doctor if you are getting worse or not better.  Sometimes further testing (cultures, endoscopy, X-ray studies, bloodwork, etc) may be needed to help diagnose and treat the cause of the diarrhea. Take extra anti-diarrheal medicines (maximum is 8 pills of 2mg loperamide a day)   Tips for POUCHING an OSTOMY   Changing Your Pouch The best time to change your pouch is in the morning, before eating or drinking anything. Your stoma can function at any time, but it will function more after eating or drinking.   Applying the pouching system  Place all your equipment close at hand before removing your pouch.  Wash your hands.  Stand or sit in front of a mirror. Use the position that works best for you. Remember that you must keep the skin around the stoma   wrinkle-free for a good seal.  Gently remove the used pouch (1-piece system) or the pouch and old wafer (2-piece system). Empty the pouch into the toilet. Save the closure clip to use again.  Wash the stoma itself and the skin around the stoma. Your stoma may bleed a little when being washed. This is normal. Rinse and pat dry. You may use a wash cloth or soft paper towels (like Bounty), mild soap (like Dial, Safeguard, or Ivory), and water. Avoid soaps that contain perfumes or lotions.  For a new pouch (1-piece system) or a new wafer (2-piece system), measure your  stoma using the stoma guide in each box of supplies.  Trace the shape of your stoma onto the back of the new pouch or the back of the new wafer. Cut out the opening. Remove the paper backing and set it aside.  Optional: Apply a skin barrier powder to surrounding skin if it is irritated (bare or weeping), and dust off the excess. Optional: Apply a skin-prep wipe (such as Skin Prep or All-Kare) to the skin around the stoma, and let it dry. Do not apply this solution if the skin is irritated (red, tender, or broken) or if you have shaved around the stoma. Optional: Apply a skin barrier paste (such as Stomahesive, Coloplast, or Premium) around the opening cut in the back of the pouch or wafer. Allow it to dry for 30 to 60 seconds.  Hold the pouch (1-piece system) or wafer (2-piece system) with the sticky side toward your body. Make sure the skin around the stoma is wrinkle-free. Center the opening on the stoma, then press firmly to your abdomen (Fig. 4). Look in the mirror to check if you are placing the pouch, or wafer, in the right position. For a 2-piece system, snap the pouch onto the wafer. Make sure it snaps into place securely.  Place your hand over the stoma and the pouch or wafer for about 30 seconds. The heat from your hand can help the pouch or wafer stick to your skin.  Add deodorant (such as Super Banish or Nullo) to your pouch. Other options include food extracts such as vanilla oil and peppermint extract. Add about 10 drops of the deodorant to the pouch. Then apply the closure clamp. Note: Do not use toxic  chemicals or commercial cleaning agents in your pouch. These substances may harm the stoma.  Optional: For extra seal, apply tape to all 4 sides around the pouch or wafer, as if you were framing a picture. You may use any brand of medical adhesive tape. Change your pouch every 5 to 7 days. Change it immediately if a leak occurs.  Wash your hands afterwards.  If you are wearing a  2-piece system, you may use 2 new pouches per week and alternate them. Rinse the pouch with mild soap and warm water and hang it to dry for the next day. Apply the fresh pouch. Alternate the 2 pouches like this for a week. After a week, change the wafer and begin with 2 new pouches. Place the old pouches in a plastic bag, and put them in the trash.   LIVING WITH AN OSTOMY  Emptying Your Pouch Empty your pouch when it is one-third full (of urine, stool, and/or gas). If you wait until your pouch is fuller than this, it will be more difficult to empty and more noticeable. When you empty your pouch, either put toilet paper in the toilet bowl first, or flush the   toilet while you empty the pouch. This will reduce splashing. You can empty the pouch between your legs or to one side while sitting, or while standing or stooping. If you have a 2-piece system, you can snap off the pouch to empty it. Remember that your stoma may function during this time. If you wish to rinse your pouch after you empty it, a turkey baster can be helpful. When using a baster, squirt water up into the pouch through the opening at the bottom. With a 2-piece system, you can snap off the pouch to rinse it. After rinsing  your pouch, empty it into the toilet. When rinsing your pouch at home, put a few granules of Dreft soap in the rinse water. This helps lubricate and freshen your pouch. The inside of your pouch can be sprayed with non-stick cooking oil (Pam spray). This may help reduce stool sticking to the inside of the pouch.  Bathing You may shower or bathe with your pouch on or off. Remember that your stoma may function during this time.  The materials you use to wash your stoma and the skin around it should be clean, but they do not need to be sterile.  Wearing Your Pouch During hot weather, or if you perspire a lot in general, wear a cover over your pouch. This may prevent a rash on your skin under the pouch. Pouch covers are  sold at ostomy supply stores. Wear the pouch inside your underwear for better support. Watch your weight. Any gain or loss of 10 to 15 pounds or more can change the way your pouch fits.  Going Away From Home A collapsible cup (like those that come in travel kits) or a soft plastic squirt bottle with a pull-up top (like a travel bottle for shampoo) can be used for rinsing your pouch when you are away from home. Tilt the opening of the pouch at an upward angle when using a cup to rinse.  Carry wet wipes or extra tissues to use in public bathrooms.  Carry an extra pouching system with you at all times.  Never keep ostomy supplies in the glove compartment of your car. Extreme heat or cold can damage the skin barriers and adhesive wafers on the pouch.  When you travel, carry your ostomy supplies with you at all times. Keep them within easy reach. Do not pack ostomy supplies in baggage that will be checked or otherwise separated from you, because your baggage might be lost. If you're traveling out of the country, it is helpful to have a letter stating that you are carrying ostomy supplies as a medical necessity.  If you need ostomy supplies while traveling, look in the yellow pages of the telephone book under "Surgical Supplies." Or call the local ostomy organization to find out where supplies are available.  Do not let your ostomy supplies get low. Always order new pouches before you use the last one.  Reducing Odor Limit foods such as broccoli, cabbage, onions, fish, and garlic in your diet to help reduce odor. Each time you empty your pouch, carefully clean the opening of the pouch, both inside and outside, with toilet paper. Rinse your pouch 1 or 2 times daily after you empty it (see directions for emptying your pouch and going away from home). Add deodorant (such as Super Banish or Nullo) to your pouch. Use air deodorizers in your bathroom. Do not add aspirin to your pouch. Even though  aspirin can help prevent odor, it   could cause ulcers on your stoma.  When to call the doctor Call the doctor if you have any of the following symptoms: Purple, black, or white stoma Severe cramps lasting more than 6 hours Severe watery discharge from the stoma lasting more than 6 hours No output from the colostomy for 3 days Excessive bleeding from your stoma Swelling of your stoma to more than 1/2-inch larger than usual Pulling inward of your stoma below skin level Severe skin irritation or deep ulcers Bulging or other changes in your abdomen  When to call your ostomy nurse Call your ostomy/enterostomal therapy (WOCN) nurse if any of the following occurs: Frequent leaking of your pouching system Change in size or appearance of your stoma, causing discomfort or problems with your pouch Skin rash or rawness Weight gain or loss that causes problems with your pouch     FREQUENTLY ASKED QUESTIONS   Why haven't you met any of these folks who have an ostomy?  Well, maybe you have! You just did not recognize them because an ostomy doesn't show. It can be kept secret if you wish. Why, maybe some of your best friends, office associates or neighbors have an ostomy ... you never can tell. People facing ostomy surgery have many quality-of-life questions like: Will you bulge? Smell? Make noises? Will you feel waste leaving your body? Will you be a captive of the toilet? Will you starve? Be a social outcast? Get/stay married? Have babies? Easily bathe, go swimming, bend over?  OK, let's look at what you can expect:   Will you bulge?  Remember, without part of the intestine or bladder, and its contents, you should have a flatter tummy than before. You can expect to wear, with little exception, what you wore before surgery ... and this in-cludes tight clothing and bathing suits.   Will you smell?  Today, thanks to modern odor proof pouching systems, you can walk into an ostomy support group  meeting and not smell anything that is foul or offensive. And, for those with an ileostomy or colostomy who are concerned about odor when emptying their pouch, there are in-pouch deodorants that can be used to eliminate any waste odors that may exist.   Will you make noises?  Everyone produces gas, especially if they are an air-swallower. But intestinal sounds that occur from time to time are no differ-ent than a gurgling tummy, and quite often your clothing will muffle any sounds.   Will you feel the waste discharges?  For those with a colostomy or ileostomy there might be a slight pressure when waste leaves your body, but understand that the intestines have no nerve endings, so there will be no unpleasant sensations. Those with a urostomy will probably be unaware of any kidney drainage.   Will you be a captive of the toilet?  Immediately post-op you will spend more time in the bathroom than you will after your body recovers from surgery. Every person is different, but on average those with an ileostomy or urostomy may empty their pouches 4 to 6 times a day; a little  less if you have a colostomy. The average wear time between pouch system changes is 3 to 5 days and the changing process should take less than 30 minutes.   Will I need to be on a special diet? Most people return to their normal diet when they have recovered from surgery. Be sure to chew your food well, eat a well-balanced diet and drink plenty of fluids. If   you experience problems with a certain food, wait a couple of weeks and try it again.  Will there be odor and noises? Pouching systems are designed to be odor-proof or odor-resistant. There are deodorants that can be used in the pouch. Medications are also available to help reduce odor. Limit gas-producing foods and carbonated beverages. You will experience less gas and fewer noises as you heal from surgery.  How much time will it take to care for my ostomy? At first, you may  spend a lot of time learning about your ostomy and how to take care of it. As you become more comfortable and skilled at changing the pouching system, it will take very little time to care for it.   Will I be able to return to work? People with ostomies can perform most jobs. As soon as you have healed from surgery, you should be able to return to work. Heavy lifting (more than 10 pounds) may be discouraged.   What about intimacy? Sexual relationships and intimacy are important and fulfilling aspects of your life. They should continue after ostomy surgery. Intimacy-related concerns should be discussed openly between you and your partner.   Can I wear regular clothing? You do not need to wear special clothing. Ostomy pouches are fairly flat and barely noticeable. Elastic undergarments will not hurt the stoma or prevent the ostomy from functioning.   Can I participate in sports? An ostomy should not limit your involvement in sports. Many people with ostomies are runners, skiers, swimmers or participate in other active lifestyles. Talk with your caregiver first before doing heavy physical activity.  Will you starve?  Not if you follow doctor's orders at each stage of your post-op adjustment. There is no such thing as an "ostomy diet". Some people with an ostomy will be able to eat and tolerate anything; others may find diffi-culty with some foods. Each person is an individual and must determine, by trial, what is best for them. A good practice for all is to drink plenty of water.   Will you be a social outcast?  Have you met anyone who has an ostomy and is a social outcast? Why should you be the first? Only your attitude and self image will effect how you are treated. No confi-dent person is an outcast.    PROFESSIONAL HELP   Resources are available if you need help or have questions about your ostomy.   Specially trained nurses called Wound, Ostomy Continence Nurses (WOCN) are available for  consultation in most major medical centers.  Consider getting an ostomy consult at an outpatient ostomy clinic.   [facility] has an Ostomy Clinic run by an WOCN ostomy nurse at the [facility].  [phone]. [facility] can help set up an appointment   The United Ostomy Association (UOA) is a group made up of many local chapters throughout the [place]. These local groups hold meetings and provide support to prospective and existing ostomates. They sponsor educational events and have qualified visitors to make personal or telephone visits. Contact the UOA for the chapter nearest you and for other educational publications.  More detailed information can be found in Colostomy Guide, a publication of the United Ostomy Association (UOA). Contact UOA at 1-800-826-0826 or visit their web site at www.uoaa.org. The website contains links to other sites, suppliers and resources.  [facility] Secure Start Services: Start at the website to enlist for support.  Your Wound Ostomy (WOCN) nurse may have started this   process. https://www.[employer].com/en/securestart Secure Start services are designed to support people as they live their lives with an ostomy or neurogenic bladder. Enrolling is easy and at no cost to the patient. We realize that each person's needs and life journey are different. Through Secure Start services, we want to help people live their life, their way.  #######################################################  

## 2020-10-23 NOTE — Progress Notes (Signed)
Patient was given discharge instructions, and all questions were answered.  Patient was stable for discharge and was taken to the main exit by wheelchair. 

## 2020-10-27 LAB — SURGICAL PATHOLOGY

## 2020-10-29 ENCOUNTER — Other Ambulatory Visit: Payer: Self-pay

## 2020-10-29 ENCOUNTER — Telehealth: Payer: Self-pay | Admitting: *Deleted

## 2020-10-29 ENCOUNTER — Inpatient Hospital Stay: Payer: Medicare Other | Attending: Oncology | Admitting: Oncology

## 2020-10-29 VITALS — BP 115/58 | HR 76 | Temp 97.7°F | Resp 18 | Ht 63.0 in | Wt 102.6 lb

## 2020-10-29 DIAGNOSIS — C182 Malignant neoplasm of ascending colon: Secondary | ICD-10-CM

## 2020-10-29 NOTE — Progress Notes (Addendum)
[facility] New Patient Consult   Requesting MD: Leighton Ruff, Md [address] [place],  [place] [address]   ALLAYA ABBASI 75 y.o.  Oct 29, 1945    Reason for Consult: Colon cancer   HPI: Ms. Impson was diagnosed with COVID-19 in October 2021.  She reports developing malaise following COVID.  She developed weight loss earlier this year and saw her primary provider for weight loss and fatigue.  A CBC found the hemoglobin at 10.3 and the vitamin B12 level is low. A CT of the chest, abdomen, and pelvis on 08/29/2020 revealed tiny peripheral lung nodules.  No focal liver abnormality.  A circumferential mass was noted in the ascending colon beginning just above the ileocecal valve.  Mild inflammatory stranding in the fat posterior to the mass.  No adenopathy.  She was admitted on 09/08/2020 with a NSTEMI.  Cardiac catheterization revealed occlusion at the left circumflex and RCA.  An echocardiogram revealed a normal ejection fraction with inferolateral hypokinesis.  Medical therapy was recommended.  She was referred to Dr. Watt Climes and was taken to a colonoscopy on 09/10/2020.  An ulcerated nonobstructing mass was found in the ascending colon.  No bleeding was present.  Biopsies were taken and the area was tattooed.  The pathology revealed a poorly differentiated carcinoma.  She was referred to Dr. Marcello Moores and was taken to the operating room for a robot-assisted right colectomy on 10/21/2020.  A mass was noted in the right lower quadrant with minimal adherence to the omentum and right abdominal peritoneum, resected en bloc.  No metastatic disease on the peritoneum or liver.  The pathology revealed a poorly differentiated carcinoma of the proximal right colon.  No macroscopic tumor perforation.  Tumor extends into subserosal connective tissue.  No lymphovascular perineural invasion.  The resection margins are negative.  0 of 24 lymph nodes contained metastatic carcinoma.  There is loss  of MLH1 and PMS 2 expression.  She was discharged in the hospital on 10/23/2020.  She reports feeling better following surgery.  Past Medical History:  Diagnosis Date   Arthritis    Cancer (HCC)-ascending colon, T3N0 10/21/2020   colon   Dysrhythmia    HLD (hyperlipidemia)    Myocardial infarction ([facility]) 09/08/2020   PAD (peripheral artery disease) (HCC)    Pneumonia    Tobacco abuse     .  G3, P3   .  COVID-19 infection October 2021  Past Surgical History:  Procedure Laterality Date   BIOPSY  09/10/2020   Procedure: BIOPSY;  Surgeon: Clarene Essex, MD;  Location: [facility];  Service: Endoscopy;;   COLONOSCOPY N/A 09/10/2020   Procedure: COLONOSCOPY;  Surgeon: Clarene Essex, MD;  Location: [facility];  Service: Endoscopy;  Laterality: N/A;   LEFT HEART CATH AND CORONARY ANGIOGRAPHY N/A 09/09/2020   Procedure: LEFT HEART CATH AND CORONARY ANGIOGRAPHY;  Surgeon: Wellington Hampshire, MD;  Location: [facility];  Service: Cardiovascular;  Laterality: N/A;   SUBMUCOSAL TATTOO INJECTION  09/10/2020   Procedure: SUBMUCOSAL TATTOO INJECTION;  Surgeon: Clarene Essex, MD;  Location: [facility] ENDOSCOPY;  Service: Endoscopy;;   wisodm teeth Bilateral    .  Aortic bypass surgery  Medications: Reviewed  Allergies:  Allergies  Allergen Reactions   Oxycodone Nausea And Vomiting    Hallucinations/nightmares Hallucinations/nightmares    Prednisone Nausea And Vomiting   Trazodone Anxiety    Nightmares    Family history: No family history of cancer  Social History:   She  lives with her husband in [place].  She is retired from an office occupation.  She smokes 3 to 4 cigarettes/day.  She has smoked for greater than 50 years.  No alcohol use.  No transfusion history.  No risk factor for HIV or hepatitis.  She has received COVID-19 vaccines  ROS:   Positives include: "Cold" feeling prior to surgery, 20 pound weight loss-gained 3 pound since surgery, larger bowel movements prior to surgery,  headache and fatigue following COVID-19 infection  A complete ROS was otherwise negative.  Physical Exam:  Blood pressure (!) 115/58, pulse 76, temperature 97.7 F (36.5 C), resp. rate 18, height 5' 3"  (1.6 m), weight 102 lb 9.6 oz (46.5 kg), SpO2 99 %.  HEENT: Neck without mass Lungs: Clear bilaterally, no respiratory distress Cardiac: Regular rate and rhythm Abdomen: No hepatosplenomegaly, nontender, no mass, healed trocar incisions.  The low transverse incision has a superficial opening at the left lateral aspect, no surrounding erythema or drainage  Vascular: No leg edema Lymph nodes: No cervical, supraclavicular, axillary, or inguinal nodes Neurologic: Alert and oriented, the motor exam appears intact in the upper and lower extremities bilaterally Skin: No rash Musculoskeletal: No spine tenderness   LAB:  CBC  Lab Results  Component Value Date   WBC 12.9 (H) 10/23/2020   HGB 7.6 (L) 10/23/2020   HCT 24.5 (L) 10/23/2020   MCV 79.5 (L) 10/23/2020   PLT 427 (H) 10/23/2020   NEUTROABS 5.8 05/23/2007        CMP  Lab Results  Component Value Date   NA 135 10/23/2020   K 3.9 10/23/2020   CL 103 10/23/2020   CO2 27 10/23/2020   GLUCOSE 91 10/23/2020   BUN 9 10/23/2020   CREATININE 0.70 10/23/2020   CALCIUM 8.7 (L) 10/23/2020   PROT 5.9 (L) 09/09/2020   ALBUMIN 2.7 (L) 09/09/2020   AST 78 (H) 09/09/2020   ALT 15 09/09/2020   ALKPHOS 64 09/09/2020   BILITOT 0.5 09/09/2020   GFRNONAA >60 10/23/2020   GFRAA  07/31/2008    >60        The eGFR has been calculated using the MDRD equation. This calculation has not been validated in all clinical situations. eGFR's persistently <60 mL/min signify possible Chronic Kidney Disease.     Lab Results  Component Value Date   CEA1 2.8 09/10/2020    Imaging: As per age PI, CT images from 08/29/2020 reviewed with Ms. Kennon Rounds    Assessment/Plan:   Ascending colon cancer, stage IIa ([id number]), status post a right  colectomy 10/21/2020 No lymphovascular or perineural invasion, grade 3-poorly differentiated, 0/24 lymph nodes, MSI-high, loss of MLH1 and PMS2 expression MLH1 hypermethylation present CT 08/29/2020-tiny peripheral lung nodules, ascending colon mass, aorta to iliac artery bypass graft Normal preoperative CEA  Peripheral vascular disease CAD, NSTEMI 09/08/2020 Microcytic anemia secondary #1 Ongoing tobacco use Hyperlipidemia CT chest 08/28/2020--3 mm and less peripheral lung nodules   Disposition:   Ms. Penninger has been diagnosed with colon cancer.  She had a stage II cancer of the right colon.  I reviewed the prognosis and details of the surgical pathology report with Ms. Pesci and her husband.  We discussed the benefit of adjuvant chemotherapy in patients with resected stage III colon cancer.  We discussed the indication for chemotherapy in patients with stage II disease.  The only high risk feature of her tumor is the poorly differentiated histology.  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  The MSI high status confers an improved prognosis and lack of benefit to adjuvant 5-fluorouracil.  I do not recommend adjuvant chemotherapy in her case.  We will request MSI and MLH1 methylation testing.  She will return in 4 weeks for a peripheral blood circulating tumor DNA test.  Ms. Wilds will return for an office visit and CEA in 6 months.  She does not appear to have hereditary nonpolyposis colon cancer syndrome, but her family members are at increased risk of developing colorectal cancer and should receive appropriate screening.  Ms. Lotz has ongoing tobacco use.  I encouraged her to discontinue smoking.  The lung nodules are likely benign.  We will repeat a chest CT at a 1 year interval.  She will continue colonoscopy surveillance with Dr. Watt Climes.  Betsy Coder, MD  10/29/2020, 4:36 PM

## 2020-10-29 NOTE — Telephone Encounter (Signed)
Called husband to offer new patient appointment today at 1:30 pm. He accepted the appointment saying Alejandra Ford was anxious to see Dr. Benay Spice. Provided directions to office, parking, mask and visitor policy. Suggested she bring sweater and appointment will last ~ 1 hour

## 2020-11-04 ENCOUNTER — Other Ambulatory Visit: Payer: Self-pay

## 2020-11-04 NOTE — Progress Notes (Signed)
The proposed treatment discussed in conference is for discussion purpose only and is not a binding recommendation.  The patients have not been physically examined, or presented with their treatment options.  Therefore, final treatment plans cannot be decided.  

## 2020-11-23 ENCOUNTER — Encounter: Payer: Self-pay | Admitting: *Deleted

## 2020-11-30 ENCOUNTER — Inpatient Hospital Stay: Payer: Medicare Other | Attending: Oncology

## 2020-11-30 ENCOUNTER — Other Ambulatory Visit: Payer: Self-pay

## 2020-11-30 DIAGNOSIS — C182 Malignant neoplasm of ascending colon: Secondary | ICD-10-CM | POA: Insufficient documentation

## 2020-11-30 LAB — CEA (ACCESS): CEA (CHCC): 2.75 ng/mL (ref 0.00–5.00)

## 2020-12-14 LAB — GUARDANT 360

## 2020-12-22 ENCOUNTER — Telehealth: Payer: Self-pay

## 2020-12-22 NOTE — Telephone Encounter (Signed)
-----   Message from Ladell Pier, MD sent at 12/18/2020  5:14 PM EDT ----- Please call patient, additional molecular testing on the tumor reveals she does not appear to have a hereditary cancer syndrome, the circulating cancer DNA test was negative, follow-up as scheduled

## 2020-12-22 NOTE — Telephone Encounter (Signed)
Pt verbalized understanding.

## 2021-01-06 ENCOUNTER — Encounter: Payer: Self-pay | Admitting: *Deleted

## 2021-01-06 NOTE — Progress Notes (Signed)
Per request of [facility], faxed office note and pathology reports for forward to insurance company for approval of testing.

## 2021-01-08 ENCOUNTER — Telehealth (HOSPITAL_BASED_OUTPATIENT_CLINIC_OR_DEPARTMENT_OTHER): Payer: Self-pay | Admitting: Cardiology

## 2021-01-08 MED ORDER — METOPROLOL TARTRATE 25 MG PO TABS: 12.5000 mg | ORAL_TABLET | Freq: Two times a day (BID) | ORAL | 1 refills | Status: DC

## 2021-01-08 NOTE — Telephone Encounter (Signed)
Pt c/o medication issue:  1. Name of Medication: metoprolol tartrate (LOPRESSOR) 25 MG tablet  2. How are you currently taking this medication (dosage and times per day)? Take 0.5 tablets (12.5 mg total) by mouth 2 (two) times daily.  3. Are you having a reaction (difficulty breathing--STAT)? no  4. What is your medication issue? Calling to see if she still supposed to be taking this medicine. If so they patient needs a refill sent over for her. Please advise

## 2021-01-08 NOTE — Telephone Encounter (Signed)
Returned pt. Call, refill request filled

## 2021-01-12 ENCOUNTER — Ambulatory Visit (HOSPITAL_BASED_OUTPATIENT_CLINIC_OR_DEPARTMENT_OTHER): Payer: Medicare Other | Admitting: Cardiology

## 2021-02-04 ENCOUNTER — Telehealth: Payer: Self-pay | Admitting: *Deleted

## 2021-02-04 ENCOUNTER — Other Ambulatory Visit: Payer: Self-pay | Admitting: *Deleted

## 2021-02-04 DIAGNOSIS — C182 Malignant neoplasm of ascending colon: Secondary | ICD-10-CM

## 2021-02-04 NOTE — Telephone Encounter (Signed)
Notified by [employer] representative that patient is past due her [employer] #2 collection for [facility] patient and she agrees to the collection on 02/12/21 at 1:30 pm.

## 2021-02-12 ENCOUNTER — Inpatient Hospital Stay: Payer: Medicare Other | Attending: Oncology

## 2021-02-12 ENCOUNTER — Ambulatory Visit (HOSPITAL_BASED_OUTPATIENT_CLINIC_OR_DEPARTMENT_OTHER): Payer: Medicare Other | Admitting: Cardiology

## 2021-02-12 ENCOUNTER — Other Ambulatory Visit: Payer: Self-pay

## 2021-02-12 VITALS — BP 124/61 | HR 65 | Ht 63.0 in | Wt 100.0 lb

## 2021-02-12 DIAGNOSIS — I2510 Atherosclerotic heart disease of native coronary artery without angina pectoris: Secondary | ICD-10-CM | POA: Diagnosis not present

## 2021-02-12 DIAGNOSIS — C182 Malignant neoplasm of ascending colon: Secondary | ICD-10-CM

## 2021-02-12 DIAGNOSIS — I739 Peripheral vascular disease, unspecified: Secondary | ICD-10-CM

## 2021-02-12 DIAGNOSIS — I252 Old myocardial infarction: Secondary | ICD-10-CM

## 2021-02-12 DIAGNOSIS — E7800 Pure hypercholesterolemia, unspecified: Secondary | ICD-10-CM | POA: Diagnosis not present

## 2021-02-12 DIAGNOSIS — Z7189 Other specified counseling: Secondary | ICD-10-CM

## 2021-02-12 NOTE — Patient Instructions (Signed)

## 2021-02-12 NOTE — Progress Notes (Signed)
Cardiology Office Note:    Date:  02/12/2021   ID:  KATALEYA Ford, DOB 09-Jan-1946, MRN [id number]  PCP:  Glenis Smoker, MD  Cardiologist:  Buford Dresser, MD  Referring MD: Glenis Smoker, *   CC: follow up  History of Present Illness:    Alejandra Ford is a 75 y.o. female with a hx of hyperlipidemia, PAD, and tobacco abuse, recent NSTEMI who is seen for follow up. Her initial appointment 10/05/2020 had been scheduled as a new consult at the request of Glenis Smoker, * for the evaluation and management of palpitations, but in the interim she was admitted to the hospital and treated for NSTEMI.  Today: Overall, she has been feeling pretty good. After her last visit she did continue to have heart palpitations when she first woke up in the morning. However, her palpitations have not recurred in the past few weeks. Also, she has not needed to take her nitroglycerin.  After bending over she will become lightheaded and dizzy. She does try to bend with her knees, but this is difficult for her.  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 She lost 92 lbs. Two weeks ago, she began regaining some weight, and is 100 lbs this morning. She notes eating unhealthy foods lately. Mainly she is now eating chicken, [place], and pork loin. She continues to work up to her baseline diet.  For exercise, she tries to walk every day weather permitting. She may walk in her house or outside. She is able to sweep the floors without issue.  She continues to tolerate her medication regimen.  She denies any chest pain, or shortness of breath. No syncope, orthopnea, PND, lower extremity edema or exertional symptoms.   Past Medical History:  Diagnosis Date   Arthritis    Cancer ([place])    colon   Dysrhythmia    HLD (hyperlipidemia)    Myocardial infarction [facility])    PAD (peripheral artery disease) ([place])    Pneumonia     Tobacco abuse     Past Surgical History:  Procedure Laterality Date   BIOPSY  09/10/2020   Procedure: BIOPSY;  Surgeon: Clarene Essex, MD;  Location: [place];  Service: Endoscopy;;   COLONOSCOPY N/A 09/10/2020   Procedure: COLONOSCOPY;  Surgeon: Clarene Essex, MD;  Location: [place];  Service: Endoscopy;  Laterality: N/A;   LEFT HEART CATH AND CORONARY ANGIOGRAPHY N/A 09/09/2020   Procedure: LEFT HEART CATH AND CORONARY ANGIOGRAPHY;  Surgeon: Wellington Hampshire, MD;  Location: [place] CV LAB;  Service: Cardiovascular;  Laterality: N/A;   SUBMUCOSAL TATTOO INJECTION  09/10/2020   Procedure: SUBMUCOSAL TATTOO INJECTION;  Surgeon: Clarene Essex, MD;  Location: [facility] ENDOSCOPY;  Service: Endoscopy;;   wisodm teeth Bilateral     Current Medications: Current Outpatient Medications on File Prior to Visit  Medication Sig   acetaminophen (TYLENOL) 500 MG tablet Take 500 mg by mouth every 6 (six) hours as needed for moderate pain.   aspirin EC 81 MG tablet Take 81 mg by mouth daily. Swallow whole.   atorvastatin (LIPITOR) 80 MG tablet Take 1 tablet (80 mg total) by mouth every evening.   betamethasone dipropionate 0.05 % cream Apply 1 application topically daily as needed (eczema on feet).   Cholecalciferol (VITAMIN D) 50 MCG (2000 UT) CAPS Take 2,000 Units by mouth at bedtime.   ibuprofen (ADVIL) 200 MG tablet Take 200 mg by  mouth every 6 (six) hours as needed for moderate pain.   metoprolol tartrate (LOPRESSOR) 25 MG tablet Take 0.5 tablets (12.5 mg total) by mouth 2 (two) times daily.   nitroGLYCERIN (NITROSTAT) 0.4 MG SL tablet Place 1 tablet (0.4 mg total) under the tongue every 5 (five) minutes x 3 doses as needed for chest pain.   polyethylene glycol (MIRALAX / GLYCOLAX) 17 g packet Take 17 g by mouth daily as needed for moderate constipation.   traMADol (ULTRAM) 50 MG tablet Take 1-2 tablets (50-100 mg total) by mouth every 6 (six) hours as needed.   No current  facility-administered medications on file prior to visit.     Allergies:   Oxycodone, Prednisone, and Trazodone   Social History   Tobacco Use   Smoking status: Former    Packs/day: 0.10    Types: Cigarettes   Smokeless tobacco: Former  Scientific laboratory technician Use: Never used  Substance Use Topics   Alcohol use: Never   Drug use: Never    Family History: No family history of CAD.  ROS:   Please see the history of present illness. (+) Lightheadedness All other systems are reviewed and negative.    EKGs/Labs/Other Studies Reviewed:    The following studies were reviewed today:  LHC 09/09/2020: Dist Cx lesion is 100% stenosed. Prox RCA lesion is 50% stenosed.   1.  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  There is also moderate proximal RCA disease. 2.  Left ventricular angiography was not performed.  EF was low normal by echo with posterior wall hypokinesis.  Mildly elevated left ventricular end-diastolic pressure.   Recommendations: This is a late presenting posterior myocardial infarction.  The occlusion involves distal left circumflex which has already collateralized.  The patient is chest pain-free and thus no indication for revascularization at this point.  I recommend continuing medical therapy. I discontinued heparin drip. The patient can proceed with endoscopic GI procedures for diagnosis of colon mass at an overall low to moderate risk.  Echo 09/09/2020:  1. Left ventricular ejection fraction, by estimation, is 50 to 55%. The  left ventricle has low normal function. The left ventricle demonstrates  regional wall motion abnormalities (see scoring diagram/findings for  description). Left ventricular diastolic parameters are indeterminate. Elevated left ventricular end-diastolic pressure. There is hypokinesis of the left ventricular, mid-apical inferior wall, inferolateral wall and  anterolateral wall. There is akinesis of the left ventricular, apical inferior  wall.   2. Right ventricular systolic function is normal. The right ventricular  size is normal. Tricuspid regurgitation signal is inadequate for assessing  PA pressure.   3. The mitral valve is normal in structure. Mild mitral valve  regurgitation. No evidence of mitral stenosis.   4. The aortic valve is tricuspid. There is mild calcification of the  aortic valve. There is mild thickening of the aortic valve. Aortic valve  regurgitation is not visualized. Mild aortic valve sclerosis is present,  with no evidence of aortic valve stenosis.   5. The inferior vena cava is normal in size with greater than 50%  respiratory variability, suggesting right atrial pressure of 3 mmHg.   Comparison(s): Prior images unable to be directly viewed, comparison made by report only.   Conclusion(s)/Recommendation(s): Wall motion abnormalities in the lateral and inferior wall. Results communicated to primary team, cardiology team also aware.   CT Chest/Abdomen/Pelvis 08/28/2020: COMPARISON:  CTA, 06/18/2008.  Cardiovascular: Heart normal  in size and configuration. No pericardial effusion. Mild three-vessel coronary artery calcifications. Great vessels are normal in caliber. Thoracic aorta without dissection. Thoracic aortic atherosclerosis most evident along the descending portion. Branch vessels are widely patent.    Vascular/Lymphatic: Aortic atherosclerosis. Aortic bypass beginning at the infrarenal portion with widely patent common iliac limbs. Significant atherosclerosis noted of the native external iliac and internal iliac arteries. There is a thrombosed by femoral artery graft crossing the low anterior abdomen.   IMPRESSION: 1. 7.5 cm long circumferential mass of the lower ascending colon just above the ileocecal valve consistent with malignancy. 2. No evidence of metastatic disease in the abdomen or pelvis. 3.  There are scattered tiny lung nodules are most likely areas of scarring. Metastatic lesions are not excluded, but felt unlikely. Close attention on follow-up imaging recommended. 4. No acute abnormalities within the chest, abdomen or pelvis. 5. Aortic atherosclerosis. No aneurysm or dissection. Bypass graft from the aorta to the iliac arteries is widely patent. Thrombosed by femoral artery bypass graft.  EKG:  EKG is personally reviewed.   02/12/2021: not ordered today 10/05/2020: NSR with TWI in inferiorlateral leads  Recent Labs: 09/09/2020: ALT 15; TSH 1.431 10/23/2020: BUN 9; Creatinine, Ser 0.70; Hemoglobin 7.6; Platelets 427; Potassium 3.9; Sodium 135   Recent Lipid Panel    Component Value Date/Time   CHOL 122 09/09/2020 0726   TRIG 86 09/09/2020 0726   HDL 45 09/09/2020 0726   CHOLHDL 2.7 09/09/2020 0726   VLDL 17 09/09/2020 0726   LDLCALC 60 09/09/2020 0726    Physical Exam:    VS:  BP 124/61 (BP Location: Left Arm, Patient Position: Sitting, Cuff Size: Normal)   Pulse 65   Ht 5\' 3"  (1.6 m)   Wt 100 lb (45.4 kg)   SpO2 99%   BMI 17.71 kg/m     Wt Readings from Last 3 Encounters:  02/12/21 100 lb (45.4 kg)  10/29/20 102 lb 9.6 oz (46.5 kg)  10/23/20 113 lb 12.1 oz (51.6 kg)    GEN: Thin, well developed in no acute distress HEENT: Normal, moist mucous membranes NECK: No JVD CARDIAC: regular rhythm, normal S1 and S2, no rubs or gallops. No murmur. VASCULAR: Radial and DP pulses 2+ bilaterally. No carotid bruits RESPIRATORY:  Clear to auscultation without rales, wheezing or rhonchi  ABDOMEN: Soft, non-tender, non-distended MUSCULOSKELETAL:  Ambulates independently SKIN: Warm and dry, no edema NEUROLOGIC:  Alert and oriented x 3. No focal neuro deficits noted. PSYCHIATRIC:  Normal affect    ASSESSMENT:    1. Coronary artery disease involving native coronary artery of native heart without angina pectoris   2. History of non-ST elevation myocardial infarction  (NSTEMI)   3. PAD (peripheral artery disease) ([place])   4. Pure hypercholesterolemia   5. Cardiac risk counseling   6. Counseling on health promotion and disease prevention     PLAN:    Coronary disease, without angina History of NSTEMI History of PAD Pure hypercholesterolemia, LDL goal <70 -chest pain free, reviewed her anginal equivalent -echo, cath results as above -tolerating aspirin, atorvastatin, metoprolol -last LDL 60  Tobacco abuse counseling: no longer smoking, recommended continued cessation  Cardiac risk counseling and prevention recommendations: -recommend heart healthy/Mediterranean diet, with whole grains, fruits, vegetable, fish, lean meats, nuts, and olive oil. Limit salt. -recommend moderate walking, 3-5 times/week for 30-50 minutes each session. Aim for at least 150 minutes.week. Goal should be pace of 3 miles/hours, or walking 1.5 miles in 30 minutes -recommend avoidance of  tobacco products. Avoid excess alcohol. -ASCVD risk score: The ASCVD Risk score (Arnett DK, et al., 2019) failed to calculate for the following reasons:   The patient has a prior MI or stroke diagnosis    Plan for follow up: 6 months or sooner as needed.  Buford Dresser, MD, PhD, [facility]    Medication Adjustments/Labs and Tests Ordered: Current medicines are reviewed at length with the patient today.  Concerns regarding medicines are outlined above.   No orders of the defined types were placed in this encounter.   No orders of the defined types were placed in this encounter.   Patient Instructions  Medication Instructions:  Your Physician recommend you continue on your current medication as directed.    *If you need a refill on your cardiac medications before your next appointment, please call your pharmacy*   Lab Work: None ordered today   Testing/Procedures: None ordered today   Follow-Up: At [facility], you and your health needs are  our priority.  As part of our continuing mission to provide you with exceptional heart care, we have created designated Provider Care Teams.  These Care Teams include your primary Cardiologist (physician) and Advanced Practice Providers (APPs -  Physician Assistants and Nurse Practitioners) who all work together to provide you with the care you need, when you need it.  We recommend signing up for the patient portal called "MyChart".  Sign up information is provided on this After Visit Summary.  MyChart is used to connect with patients for Virtual Visits (Telemedicine).  Patients are able to view lab/test results, encounter notes, upcoming appointments, etc.  Non-urgent messages can be sent to your provider as well.   To learn more about what you can do with MyChart, go to [email or web address].    Your next appointment:   6 month(s)  The format for your next appointment:   In Person  Provider:   Buford Dresser, MD        [facility],acting as a scribe for Buford Dresser, MD.,have documented all relevant documentation on the behalf of Buford Dresser, MD,as directed by  Buford Dresser, MD while in the presence of Buford Dresser, MD.  I, Madelin Rear, have reviewed all documentation for this visit. The documentation on 02/12/21 for the exam, diagnosis, procedures, and orders are all accurate and complete.   Signed, Buford Dresser, MD PhD 02/12/2021 2:36 PM    [place]

## 2021-02-25 ENCOUNTER — Encounter: Payer: Self-pay | Admitting: *Deleted

## 2021-02-25 NOTE — Progress Notes (Signed)
Notified by Fredna Dow at [facility] that sample drawn on 02/13/21 is being re-tested with back up tubes. Will be a delay in results.

## 2021-04-23 ENCOUNTER — Encounter (HOSPITAL_BASED_OUTPATIENT_CLINIC_OR_DEPARTMENT_OTHER): Payer: Self-pay | Admitting: Cardiology

## 2021-05-03 ENCOUNTER — Inpatient Hospital Stay: Payer: Medicare Other

## 2021-05-03 ENCOUNTER — Inpatient Hospital Stay: Payer: Medicare Other | Admitting: Oncology

## 2021-05-07 ENCOUNTER — Other Ambulatory Visit (HOSPITAL_BASED_OUTPATIENT_CLINIC_OR_DEPARTMENT_OTHER): Payer: Self-pay | Admitting: Cardiology

## 2021-05-07 NOTE — Telephone Encounter (Signed)
Rx(s) sent to pharmacy electronically.  

## 2021-05-12 ENCOUNTER — Other Ambulatory Visit: Payer: Self-pay | Admitting: *Deleted

## 2021-05-12 DIAGNOSIS — C182 Malignant neoplasm of ascending colon: Secondary | ICD-10-CM

## 2021-05-13 ENCOUNTER — Other Ambulatory Visit: Payer: Self-pay

## 2021-05-13 ENCOUNTER — Inpatient Hospital Stay: Payer: Medicare Other | Admitting: Oncology

## 2021-05-13 ENCOUNTER — Telehealth: Payer: Self-pay

## 2021-05-13 ENCOUNTER — Inpatient Hospital Stay: Payer: Medicare Other | Attending: Oncology

## 2021-05-13 VITALS — BP 112/61 | HR 75 | Temp 97.8°F | Resp 20 | Ht 63.0 in | Wt 97.8 lb

## 2021-05-13 DIAGNOSIS — C182 Malignant neoplasm of ascending colon: Secondary | ICD-10-CM

## 2021-05-13 DIAGNOSIS — E785 Hyperlipidemia, unspecified: Secondary | ICD-10-CM | POA: Diagnosis not present

## 2021-05-13 DIAGNOSIS — I739 Peripheral vascular disease, unspecified: Secondary | ICD-10-CM | POA: Insufficient documentation

## 2021-05-13 DIAGNOSIS — R918 Other nonspecific abnormal finding of lung field: Secondary | ICD-10-CM | POA: Insufficient documentation

## 2021-05-13 DIAGNOSIS — I2510 Atherosclerotic heart disease of native coronary artery without angina pectoris: Secondary | ICD-10-CM | POA: Insufficient documentation

## 2021-05-13 DIAGNOSIS — I252 Old myocardial infarction: Secondary | ICD-10-CM | POA: Diagnosis not present

## 2021-05-13 DIAGNOSIS — D509 Iron deficiency anemia, unspecified: Secondary | ICD-10-CM | POA: Insufficient documentation

## 2021-05-13 LAB — CEA (ACCESS): CEA (CHCC): 3.78 ng/mL (ref 0.00–5.00)

## 2021-05-13 NOTE — Progress Notes (Signed)
[place] OFFICE PROGRESS NOTE   Diagnosis: Colon cancer  INTERVAL HISTORY:   Alejandra Ford returns as scheduled.  She currently feels well.  Good appetite.  No difficulty with bowel or bladder function.  No bleeding.  She had "pharyngitis "last month.  She continues smoking.  Objective:  Vital signs in last 24 hours:  Blood pressure 112/61, pulse 75, temperature 97.8 F (36.6 C), temperature source Oral, resp. rate 20, height _0  (1.6 m), weight 97 lb 12.8 oz (44.4 kg), SpO2 98 %.    HEENT: Neck without mass Lymphatics: No cervical, supraclavicular, axillary, or inguinal nodes Resp: Inspiratory/expiratory rhonchi and wheeze at the left greater than right posterior chest, no respiratory distress Cardio: Regular rate and rhythm GI: No hepatosplenomegaly, no mass, nontender, nodularity in the abdominal wall lateral to each side of the low transverse incision Vascular: No leg edema   Lab Results:  Lab Results  Component Value Date   WBC 12.9 (H) 10/23/2020   HGB 7.6 (L) 10/23/2020   HCT 24.5 (L) 10/23/2020   MCV 79.5 (L) 10/23/2020   PLT 427 (H) 10/23/2020   NEUTROABS 5.8 05/23/2007    CMP  Lab Results  Component Value Date   NA 135 10/23/2020   K 3.9 10/23/2020   CL 103 10/23/2020   CO2 27 10/23/2020   GLUCOSE 91 10/23/2020   BUN 9 10/23/2020   CREATININE 0.70 10/23/2020   CALCIUM 8.7 (L) 10/23/2020   PROT 5.9 (L) 09/09/2020   ALBUMIN 2.7 (L) 09/09/2020   AST 78 (H) 09/09/2020   ALT 15 09/09/2020   ALKPHOS 64 09/09/2020   BILITOT 0.5 09/09/2020   GFRNONAA >60 10/23/2020   GFRAA  07/31/2008    >60        The eGFR has been calculated using the MDRD equation. This calculation has not been validated in all clinical situations. eGFR's persistently <60 mL/min signify possible Chronic Kidney Disease.    Lab Results  Component Value Date   CEA1 2.8 09/10/2020   CEA 2.75 11/30/2020   Medications: I have reviewed the patient's current  medications.   Assessment/Plan: Ascending colon cancer, stage IIa ([id number]), status post a right colectomy 10/21/2020 No lymphovascular or perineural invasion, grade 3-poorly differentiated, 0/24 lymph nodes, MSI-high, loss of MLH1 and PMS2 expression MLH1 hypermethylation present CT 08/29/2020-tiny peripheral lung nodules, ascending colon mass, aorta to iliac artery bypass graft Normal preoperative CEA  Peripheral vascular disease CAD, NSTEMI 09/08/2020 Microcytic anemia secondary #1 Ongoing tobacco use Hyperlipidemia CT chest 08/28/2020--3 mm and less peripheral lung nodules    Disposition: Alejandra Ford is in clinical remission from colon cancer.  She will undergo a repeat chest CT in June to follow-up on lung nodules.  She will return for an office visit in 6 months.  We will follow-up on the CEA from today.  The nodularity lateral to the low transverse incision is likely related to fat necrosis.  Betsy Coder, MD  05/13/2021  11:59 AM

## 2021-05-13 NOTE — Telephone Encounter (Signed)
-----   Message from Ladell Pier, MD sent at 05/13/2021  4:06 PM EST ----- ?Please call patient, CEA is normal, follow-up as scheduled ? ?

## 2021-05-13 NOTE — Telephone Encounter (Signed)
Pt verbalized understanding.

## 2021-07-14 ENCOUNTER — Other Ambulatory Visit: Payer: Self-pay | Admitting: Family Medicine

## 2021-07-14 DIAGNOSIS — Z1231 Encounter for screening mammogram for malignant neoplasm of breast: Secondary | ICD-10-CM

## 2021-07-19 ENCOUNTER — Other Ambulatory Visit: Payer: Self-pay | Admitting: Family Medicine

## 2021-07-19 DIAGNOSIS — R5381 Other malaise: Secondary | ICD-10-CM

## 2021-07-22 ENCOUNTER — Ambulatory Visit: Payer: Medicare Other

## 2021-07-26 ENCOUNTER — Other Ambulatory Visit: Payer: Self-pay | Admitting: Family Medicine

## 2021-07-26 DIAGNOSIS — E2839 Other primary ovarian failure: Secondary | ICD-10-CM

## 2021-07-27 ENCOUNTER — Ambulatory Visit: Payer: Medicare Other | Admitting: Physical Therapy

## 2021-07-28 ENCOUNTER — Telehealth: Payer: Self-pay | Admitting: *Deleted

## 2021-07-28 ENCOUNTER — Other Ambulatory Visit: Payer: Self-pay | Admitting: Family Medicine

## 2021-07-28 ENCOUNTER — Ambulatory Visit
Admission: RE | Admit: 2021-07-28 | Discharge: 2021-07-28 | Disposition: A | Discharge status: Home / Self Care | Payer: Medicare Other | Source: Ambulatory Visit | Attending: Family Medicine | Admitting: Family Medicine

## 2021-07-28 DIAGNOSIS — R509 Fever, unspecified: Secondary | ICD-10-CM

## 2021-07-28 NOTE — Telephone Encounter (Signed)
Scheduled CT chest at [facility] for 08/16/21 at 1130 with 1115 arrival. No prep. Patient notified.

## 2021-08-12 ENCOUNTER — Ambulatory Visit (HOSPITAL_BASED_OUTPATIENT_CLINIC_OR_DEPARTMENT_OTHER): Payer: Medicare Other | Admitting: Cardiology

## 2021-08-12 ENCOUNTER — Encounter (HOSPITAL_BASED_OUTPATIENT_CLINIC_OR_DEPARTMENT_OTHER): Payer: Self-pay | Admitting: Cardiology

## 2021-08-12 VITALS — BP 120/66 | HR 72 | Ht 63.0 in | Wt 98.4 lb

## 2021-08-12 DIAGNOSIS — I739 Peripheral vascular disease, unspecified: Secondary | ICD-10-CM | POA: Diagnosis not present

## 2021-08-12 DIAGNOSIS — E7800 Pure hypercholesterolemia, unspecified: Secondary | ICD-10-CM

## 2021-08-12 DIAGNOSIS — R002 Palpitations: Secondary | ICD-10-CM

## 2021-08-12 DIAGNOSIS — I2510 Atherosclerotic heart disease of native coronary artery without angina pectoris: Secondary | ICD-10-CM

## 2021-08-12 DIAGNOSIS — I252 Old myocardial infarction: Secondary | ICD-10-CM

## 2021-08-12 DIAGNOSIS — F17210 Nicotine dependence, cigarettes, uncomplicated: Secondary | ICD-10-CM

## 2021-08-12 DIAGNOSIS — Z716 Tobacco abuse counseling: Secondary | ICD-10-CM | POA: Diagnosis not present

## 2021-08-12 NOTE — Patient Instructions (Signed)

## 2021-08-12 NOTE — Progress Notes (Signed)
Cardiology Office Note:    Date:  08/12/2021   ID:  Alejandra Ford, DOB [birth year], MRN [id number]  PCP:  Glenis Smoker, MD  Cardiologist:  Buford Dresser, MD  Referring MD: Glenis Smoker, *   CC: follow up  History of Present Illness:    Alejandra Ford is a 76 y.o. female with a hx of hyperlipidemia, PAD, and tobacco abuse, CAD with history of NSTEMI who is seen for follow up. Her initial appointment 10/05/2020 had been scheduled as a new consult at the request of Glenis Smoker, * for the evaluation and management of palpitations, but in the interim she was admitted to the hospital and treated for NSTEMI.  At her last appointment she denied any recurring palpitations in the prior few weeks. She had some lightheadedness/dizziness after bending over. 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 She lost 92 lbs. Two weeks prior she began regaining some weight; she was 100 lbs that morning. She continued to work up to her baseline diet and was trying to walk daily.  Today: She has been scheduled for a chest CT at [facility] on 08/16/21.  This week, her palpitations began recurring in the mornings, intermittently for about 10-15 minutes. She also has a "couple little twinges" during these episodes. She has been trying to get up slowly, staying awake in bed for a time before getting up. This seems to help somewhat.  Of note, she is recovering from pneumonia (was ongoing for several months), and has been treated with antibiotics. At this time her breathing is stable. Her palpitations this week were not occurring with her pneumonia.  She endorses short periods of dizziness, but denies any falls or syncope.  Lately she has been more active because she is generally feeling better. She has not needed her nitroglycerin, and has not taken her pain medication for months.  Currently she is smoking 3-4  cigarettes a day.  She denies any peripheral edema. No headaches, orthopnea, or PND.   Past Medical History:  Diagnosis Date   Arthritis    Cancer ([place])    colon   Dysrhythmia    HLD (hyperlipidemia)    Myocardial infarction [facility])    PAD (peripheral artery disease) ([place])    Pneumonia    Tobacco abuse     Past Surgical History:  Procedure Laterality Date   BIOPSY  09/10/2020   Procedure: BIOPSY;  Surgeon: Clarene Essex, MD;  Location: [place];  Service: Endoscopy;;   COLONOSCOPY N/A 09/10/2020   Procedure: COLONOSCOPY;  Surgeon: Clarene Essex, MD;  Location: [place];  Service: Endoscopy;  Laterality: N/A;   LEFT HEART CATH AND CORONARY ANGIOGRAPHY N/A 09/09/2020   Procedure: LEFT HEART CATH AND CORONARY ANGIOGRAPHY;  Surgeon: Wellington Hampshire, MD;  Location: [place] CV LAB;  Service: Cardiovascular;  Laterality: N/A;   SUBMUCOSAL TATTOO INJECTION  09/10/2020   Procedure: SUBMUCOSAL TATTOO INJECTION;  Surgeon: Clarene Essex, MD;  Location: [facility] ENDOSCOPY;  Service: Endoscopy;;   wisodm teeth Bilateral     Current Medications: Current Outpatient Medications on File Prior to Visit  Medication Sig   acetaminophen (TYLENOL) 500 MG tablet Take 500 mg by mouth every 6 (six) hours as needed for moderate pain.   aspirin EC 81 MG tablet Take 81 mg by mouth daily. Swallow whole.   atorvastatin (LIPITOR) 80 MG tablet Take 1 tablet (80 mg total) by mouth every evening.  betamethasone dipropionate 0.05 % cream Apply 1 application topically daily as needed (eczema on feet).   Cholecalciferol (VITAMIN D) 50 MCG (2000 UT) CAPS Take 2,000 Units by mouth at bedtime.   ibuprofen (ADVIL) 200 MG tablet Take 200 mg by mouth every 6 (six) hours as needed for moderate pain.   metoprolol tartrate (LOPRESSOR) 25 MG tablet TAKE 1/2 TABLET(12.5 MG) BY MOUTH TWICE DAILY   nitroGLYCERIN (NITROSTAT) 0.4 MG SL tablet Place 1 tablet (0.4 mg total) under the tongue every 5 (five) minutes x 3 doses as  needed for chest pain.   polyethylene glycol (MIRALAX / GLYCOLAX) 17 g packet Take 17 g by mouth daily as needed for moderate constipation.   traMADol (ULTRAM) 50 MG tablet Take 1-2 tablets (50-100 mg total) by mouth every 6 (six) hours as needed.   No current facility-administered medications on file prior to visit.     Allergies:   Oxycodone, Prednisone, and Trazodone   Social History   Tobacco Use   Smoking status: Former    Packs/day: 0.10    Types: Cigarettes   Smokeless tobacco: Former  Scientific laboratory technician Use: Never used  Substance Use Topics   Alcohol use: Never   Drug use: Never    Family History: No family history of CAD.  ROS:   Please see the history of present illness. (+) Palpitations (+) Chest discomfort (+) Dizziness All other systems are reviewed and negative.    EKGs/Labs/Other Studies Reviewed:    The following studies were reviewed today:  LHC 09/09/2020: Dist Cx lesion is 100% stenosed. Prox RCA lesion is 50% stenosed.   1.  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  There is also moderate proximal RCA disease. 2.  Left ventricular angiography was not performed.  EF was low normal by echo with posterior wall hypokinesis.  Mildly elevated left ventricular end-diastolic pressure.   Recommendations: This is a late presenting posterior myocardial infarction.  The occlusion involves distal left circumflex which has already collateralized.  The patient is chest pain-free and thus no indication for revascularization at this point.  I recommend continuing medical therapy. I discontinued heparin drip. The patient can proceed with endoscopic GI procedures for diagnosis of colon mass at an overall low to moderate risk.  Echo 09/09/2020:  1. Left ventricular ejection fraction, by estimation, is 50 to 55%. The  left ventricle has low normal function. The left ventricle  demonstrates  regional wall motion abnormalities (see scoring diagram/findings for  description). Left ventricular diastolic parameters are indeterminate. Elevated left ventricular end-diastolic pressure. There is hypokinesis of the left ventricular, mid-apical inferior wall, inferolateral wall and anterolateral wall. There is akinesis of the left ventricular, apical inferior  wall.   2. Right ventricular systolic function is normal. The right ventricular  size is normal. Tricuspid regurgitation signal is inadequate for assessing  PA pressure.   3. The mitral valve is normal in structure. Mild mitral valve  regurgitation. No evidence of mitral stenosis.   4. The aortic valve is tricuspid. There is mild calcification of the  aortic valve. There is mild thickening of the aortic valve. Aortic valve  regurgitation is not visualized. Mild aortic valve sclerosis is present,  with no evidence of aortic valve stenosis.   5. The inferior vena cava is normal in size with greater than 50%  respiratory variability, suggesting right atrial pressure of 3 mmHg.   Comparison(s): Prior images  unable to be directly viewed, comparison made by report only.   Conclusion(s)/Recommendation(s): Wall motion abnormalities in the lateral and inferior wall. Results communicated to primary team, cardiology team also aware.   CT Chest/Abdomen/Pelvis 08/28/2020: COMPARISON:  CTA, 06/18/2008.  Cardiovascular: Heart normal in size and configuration. No pericardial effusion. Mild three-vessel coronary artery calcifications. Great vessels are normal in caliber. Thoracic aorta without dissection. Thoracic aortic atherosclerosis most evident along the descending portion. Branch vessels are widely patent.    Vascular/Lymphatic: Aortic atherosclerosis. Aortic bypass beginning at the infrarenal portion with widely patent common iliac limbs. Significant atherosclerosis noted of the native external iliac and internal iliac  arteries. There is a thrombosed by femoral artery graft crossing the low anterior abdomen.   IMPRESSION: 1. 7.5 cm long circumferential mass of the lower ascending colon just above the ileocecal valve consistent with malignancy. 2. No evidence of metastatic disease in the abdomen or pelvis. 3. There are scattered tiny lung nodules are most likely areas of scarring. Metastatic lesions are not excluded, but felt unlikely. Close attention on follow-up imaging recommended. 4. No acute abnormalities within the chest, abdomen or pelvis. 5. Aortic atherosclerosis. No aneurysm or dissection. Bypass graft from the aorta to the iliac arteries is widely patent. Thrombosed by femoral artery bypass graft.  EKG:  EKG is personally reviewed.   08/12/2021:  not ordered today 02/12/2021: not ordered 10/05/2020: NSR with TWI in inferiorlateral leads  Recent Labs: 09/09/2020: ALT 15; TSH 1.431 10/23/2020: BUN 9; Creatinine, Ser 0.70; Hemoglobin 7.6; Platelets 427; Potassium 3.9; Sodium 135   Recent Lipid Panel    Component Value Date/Time   CHOL 122 09/09/2020 0726   TRIG 86 09/09/2020 0726   HDL 45 09/09/2020 0726   CHOLHDL 2.7 09/09/2020 0726   VLDL 17 09/09/2020 0726   LDLCALC 60 09/09/2020 0726    Physical Exam:    VS:  BP 120/66   Pulse 72   Ht '5\' 3"'$  (1.6 m)   Wt 98 lb 6.4 oz (44.6 kg)   SpO2 99%   BMI 17.43 kg/m     Wt Readings from Last 3 Encounters:  08/12/21 98 lb 6.4 oz (44.6 kg)  05/13/21 97 lb 12.8 oz (44.4 kg)  02/12/21 100 lb (45.4 kg)    GEN: Thin, well developed in no acute distress HEENT: Normal, moist mucous membranes NECK: No JVD CARDIAC: regular rhythm, normal S1 and S2, no rubs or gallops. No murmur. VASCULAR: Radial and DP pulses 2+ bilaterally. No carotid bruits RESPIRATORY:  Clear to auscultation without rales, wheezing or rhonchi  ABDOMEN: Soft, non-tender, non-distended MUSCULOSKELETAL:  Ambulates independently SKIN: Warm and dry, no edema NEUROLOGIC:  Alert  and oriented x 3. No focal neuro deficits noted. PSYCHIATRIC:  Normal affect    ASSESSMENT:    1. Heart palpitations   2. Coronary artery disease involving native coronary artery of native heart without angina pectoris   3. History of non-ST elevation myocardial infarction (NSTEMI)   4. PAD (peripheral artery disease) ([place])   5. Pure hypercholesterolemia   6. Tobacco abuse counseling     PLAN:    Palpitations -discussed symptoms, options for further evaluation with event monitor or Kardiamobile -for now, she will monitor symptoms and contact me if things worsen -reviewed red flag warning signs that need immediate medical attention  Coronary disease, without angina History of NSTEMI History of PAD Pure hypercholesterolemia, LDL goal <70 -chest pain free, reviewed her anginal equivalent -echo, cath results as above -tolerating aspirin, atorvastatin, metoprolol -last  LDL 50  Tobacco abuse counseling: The patient was counseled on tobacco cessation today for 4 minutes.  Counseling included reviewing the risks of smoking tobacco products, how it impacts the patient's current medical diagnoses and different strategies for quitting.  Pharmacotherapy to aid in tobacco cessation was not prescribed today.   Cardiac risk counseling and prevention recommendations: -recommend heart healthy/Mediterranean diet, with whole grains, fruits, vegetable, fish, lean meats, nuts, and olive oil. Limit salt. -recommend moderate walking, 3-5 times/week for 30-50 minutes each session. Aim for at least 150 minutes.week. Goal should be pace of 3 miles/hours, or walking 1.5 miles in 30 minutes -recommend avoidance of tobacco products. Avoid excess alcohol. -ASCVD risk score: The ASCVD Risk score (Arnett DK, et al., 2019) failed to calculate for the following reasons:   The patient has a prior MI or stroke diagnosis    Plan for follow up: 6 months or sooner as needed.  Buford Dresser, MD, PhD,  [facility]    Medication Adjustments/Labs and Tests Ordered: Current medicines are reviewed at length with the patient today.  Concerns regarding medicines are outlined above.   No orders of the defined types were placed in this encounter.  No orders of the defined types were placed in this encounter.  Patient Instructions  Medication Instructions:  Your Physician recommend you continue on your current medication as directed.    *If you need a refill on your cardiac medications before your next appointment, please call your pharmacy*   Lab Work: None ordered today   Testing/Procedures: None ordered today   Follow-Up: At [facility], you and your health needs are our priority.  As part of our continuing mission to provide you with exceptional heart care, we have created designated Provider Care Teams.  These Care Teams include your primary Cardiologist (physician) and Advanced Practice Providers (APPs -  Physician Assistants and Nurse Practitioners) who all work together to provide you with the care you need, when you need it.  We recommend signing up for the patient portal called "MyChart".  Sign up information is provided on this After Visit Summary.  MyChart is used to connect with patients for Virtual Visits (Telemedicine).  Patients are able to view lab/test results, encounter notes, upcoming appointments, etc.  Non-urgent messages can be sent to your provider as well.   To learn more about what you can do with MyChart, go to [email or web address].    Your next appointment:   6 month(s)  The format for your next appointment:   In Person  Provider:   Buford Dresser, MD            [facility],acting as a scribe for Buford Dresser, MD.,have documented all relevant documentation on the behalf of Buford Dresser, MD,as directed by  Buford Dresser, MD while in the presence of Buford Dresser, MD.  I,  Buford Dresser, MD, have reviewed all documentation for this visit. The documentation on 08/12/21 for the exam, diagnosis, procedures, and orders are all accurate and complete.   Signed, Buford Dresser, MD PhD 08/12/2021     [place]

## 2021-08-16 ENCOUNTER — Encounter (HOSPITAL_BASED_OUTPATIENT_CLINIC_OR_DEPARTMENT_OTHER): Payer: Self-pay

## 2021-08-16 ENCOUNTER — Ambulatory Visit (HOSPITAL_BASED_OUTPATIENT_CLINIC_OR_DEPARTMENT_OTHER)
Admission: RE | Admit: 2021-08-16 | Discharge: 2021-08-16 | Disposition: A | Discharge status: Home / Self Care | Payer: Medicare Other | Source: Ambulatory Visit | Attending: Oncology | Admitting: Oncology

## 2021-08-16 DIAGNOSIS — C182 Malignant neoplasm of ascending colon: Secondary | ICD-10-CM | POA: Insufficient documentation

## 2021-08-18 ENCOUNTER — Telehealth: Payer: Self-pay

## 2021-08-18 NOTE — Telephone Encounter (Signed)
-----   Message from Ladell Pier, MD sent at 08/17/2021  9:09 PM EDT ----- Please call patient, the chest CT reveals stable lung nodules-no evidence of cancer, there are changes consistent with pneumonia, does she have current or recent symptoms of pneumonia? She should follow-up with Dr. Lindell Noe.  Please copy the CT report to Dr. Lindell Noe.  She will need a follow-up noncontrast chest CT in 2 months to be sure the lung consolidation has resolved

## 2021-08-18 NOTE — Telephone Encounter (Signed)
Pt verbalized understanding. Pt had previous CT scan on 07/28/21 that confirmed she had pneumonia she received 2 rounds of antibiotics a z-pac and azithromycin. Report forwarded to Dr Lindell Noe to schedule CT scan in 2 months

## 2021-09-06 ENCOUNTER — Encounter (HOSPITAL_BASED_OUTPATIENT_CLINIC_OR_DEPARTMENT_OTHER): Payer: Self-pay | Admitting: Cardiology

## 2021-09-14 ENCOUNTER — Other Ambulatory Visit: Payer: Self-pay | Admitting: Family Medicine

## 2021-09-14 DIAGNOSIS — R911 Solitary pulmonary nodule: Secondary | ICD-10-CM

## 2021-09-30 ENCOUNTER — Ambulatory Visit
Admission: RE | Admit: 2021-09-30 | Discharge: 2021-09-30 | Disposition: A | Discharge status: Home / Self Care | Payer: Medicare Other | Source: Ambulatory Visit | Attending: Family Medicine | Admitting: Family Medicine

## 2021-09-30 DIAGNOSIS — R911 Solitary pulmonary nodule: Secondary | ICD-10-CM

## 2021-10-11 ENCOUNTER — Other Ambulatory Visit: Payer: Self-pay | Admitting: Family Medicine

## 2021-10-11 DIAGNOSIS — R911 Solitary pulmonary nodule: Secondary | ICD-10-CM

## 2021-11-11 ENCOUNTER — Ambulatory Visit
Admission: RE | Admit: 2021-11-11 | Discharge: 2021-11-11 | Disposition: A | Discharge status: Home / Self Care | Payer: Medicare Other | Source: Ambulatory Visit | Attending: Family Medicine | Admitting: Family Medicine

## 2021-11-11 DIAGNOSIS — R911 Solitary pulmonary nodule: Secondary | ICD-10-CM

## 2021-11-12 ENCOUNTER — Ambulatory Visit: Payer: Medicare Other | Admitting: Oncology

## 2021-11-12 ENCOUNTER — Other Ambulatory Visit: Payer: Medicare Other

## 2021-12-03 ENCOUNTER — Inpatient Hospital Stay: Payer: Medicare Other | Attending: Oncology

## 2021-12-03 ENCOUNTER — Inpatient Hospital Stay: Payer: Medicare Other | Admitting: Oncology

## 2021-12-03 VITALS — BP 134/65 | HR 71 | Temp 98.1°F | Resp 18 | Ht 63.0 in | Wt 103.0 lb

## 2021-12-03 DIAGNOSIS — Z85038 Personal history of other malignant neoplasm of large intestine: Secondary | ICD-10-CM | POA: Insufficient documentation

## 2021-12-03 DIAGNOSIS — F17210 Nicotine dependence, cigarettes, uncomplicated: Secondary | ICD-10-CM | POA: Insufficient documentation

## 2021-12-03 DIAGNOSIS — I2510 Atherosclerotic heart disease of native coronary artery without angina pectoris: Secondary | ICD-10-CM | POA: Insufficient documentation

## 2021-12-03 DIAGNOSIS — I214 Non-ST elevation (NSTEMI) myocardial infarction: Secondary | ICD-10-CM | POA: Insufficient documentation

## 2021-12-03 DIAGNOSIS — I739 Peripheral vascular disease, unspecified: Secondary | ICD-10-CM | POA: Diagnosis not present

## 2021-12-03 DIAGNOSIS — E785 Hyperlipidemia, unspecified: Secondary | ICD-10-CM | POA: Diagnosis not present

## 2021-12-03 DIAGNOSIS — C182 Malignant neoplasm of ascending colon: Secondary | ICD-10-CM

## 2021-12-03 LAB — CEA (ACCESS): CEA (CHCC): 3.56 ng/mL (ref 0.00–5.00)

## 2021-12-03 NOTE — Progress Notes (Signed)
  [facility] OFFICE PROGRESS NOTE   Diagnosis: Colon cancer  INTERVAL HISTORY:   [name] returns as scheduled.  She feels well.  Good appetite.  She continues smoking.  She has not completed a post surgery colonoscopy.  Objective:  Vital signs in last 24 hours:  Blood pressure 134/65, pulse 71, temperature 98.1 F (36.7 C), temperature source Oral, resp. rate 18, height _0  (1.6 m), weight 103 lb (46.7 kg), SpO2 98 %.    Lymphatics: No cervical, supraclavicular, axillary, or inguinal nodes Resp: End inspiratory rhonchi at the left posterior base, no respiratory distress Cardio: Regular rate and rhythm GI: No hepatosplenomegaly, no mass, nontender Vascular: No leg edema  Lab Results:  Lab Results  Component Value Date   WBC 12.9 (H) 10/23/2020   HGB 7.6 (L) 10/23/2020   HCT 24.5 (L) 10/23/2020   MCV 79.5 (L) 10/23/2020   PLT 427 (H) 10/23/2020   NEUTROABS 5.8 05/23/2007    CMP  Lab Results  Component Value Date   NA 135 10/23/2020   K 3.9 10/23/2020   CL 103 10/23/2020   CO2 27 10/23/2020   GLUCOSE 91 10/23/2020   BUN 9 10/23/2020   CREATININE 0.70 10/23/2020   CALCIUM 8.7 (L) 10/23/2020   PROT 5.9 (L) 09/09/2020   ALBUMIN 2.7 (L) 09/09/2020   AST 78 (H) 09/09/2020   ALT 15 09/09/2020   ALKPHOS 64 09/09/2020   BILITOT 0.5 09/09/2020   GFRNONAA >60 10/23/2020   GFRAA  07/31/2008    >60        The eGFR has been calculated using the MDRD equation. This calculation has not been validated in all clinical situations. eGFR's persistently <60 mL/min signify possible Chronic Kidney Disease.    Lab Results  Component Value Date   CEA1 2.8 09/10/2020   CEA 3.56 12/03/2021   Medications: I have reviewed the patient's current medications.   Assessment/Plan: Ascending colon cancer, stage IIa ([id number]), status post a right colectomy 10/21/2020 No lymphovascular or perineural invasion, grade 3-poorly differentiated, 0/24 lymph nodes,  MSI-high, loss of MLH1 and PMS2 expression MLH1 hypermethylation present CT 08/29/2020-tiny peripheral lung nodules, ascending colon mass, aorta to iliac artery bypass graft Normal preoperative CEA Guardant Reveal 11/30/2020-ctDNA not detected Guardant Reveal 02/12/2021- ctDNA not detected  Peripheral vascular disease CAD, NSTEMI 09/08/2020 Microcytic anemia secondary #1 Ongoing tobacco use Hyperlipidemia CT chest 08/28/2020--3 mm and less peripheral lung nodules CT chest 08/16/2021-new consolidation in the posterior left lower lobe and mild groundglass opacity in the anterior right middle lobe suspicious for pneumonia CT chest 09/30/2021-focal left lower lobe airspace disease decreased, but not completely resolved, resolution of right upper lobe groundglass opacities, stable pulmonary micronodules CT chest 11/11/2021-consolidation in the posterior left lower lobe appears scarlike and is smaller, scattered 1-2 mm subpleural micronodules in the upper lobes-unchanged      Disposition: Alejandra Ford is in clinical remission from colon cancer.  I refer her to Dr. Watt Climes for a surveillance colonoscopy.  She will return for an office visit and CEA in 6 months.  Betsy Coder, MD  12/03/2021  12:18 PM

## 2021-12-16 ENCOUNTER — Ambulatory Visit: Payer: Medicare Other | Admitting: Internal Medicine

## 2021-12-16 ENCOUNTER — Encounter: Payer: Self-pay | Admitting: Internal Medicine

## 2021-12-16 VITALS — BP 100/50 | HR 72 | Temp 97.5°F | Ht 63.0 in | Wt 107.2 lb

## 2021-12-16 DIAGNOSIS — R0982 Postnasal drip: Secondary | ICD-10-CM | POA: Diagnosis not present

## 2021-12-16 DIAGNOSIS — F17210 Nicotine dependence, cigarettes, uncomplicated: Secondary | ICD-10-CM

## 2021-12-16 DIAGNOSIS — F17200 Nicotine dependence, unspecified, uncomplicated: Secondary | ICD-10-CM

## 2021-12-16 DIAGNOSIS — Z85038 Personal history of other malignant neoplasm of large intestine: Secondary | ICD-10-CM | POA: Diagnosis not present

## 2021-12-16 DIAGNOSIS — R9389 Abnormal findings on diagnostic imaging of other specified body structures: Secondary | ICD-10-CM

## 2021-12-16 DIAGNOSIS — R053 Chronic cough: Secondary | ICD-10-CM

## 2021-12-16 MED ORDER — FLUTICASONE PROPIONATE 50 MCG/ACT NA SUSP: 1.0000 | Freq: Every day | NASAL | 2 refills | Status: DC

## 2021-12-16 NOTE — Patient Instructions (Addendum)
Please schedule follow up scheduled with APP in 2 months.  If my schedule is not open yet, we will contact you with a reminder closer to that time. Please call [phone] if you haven't heard from [place] a month before.   Before your next visit I would like you to have: CT Chest in 2 months- we will schedule this and call you.   Follow up with [place] after the scan.  Can try fluticasone nasal spray for your cough.   Flonase - 1 spray on each side of your nose twice a day for first week, then 1 spray on each side.   Instructions for use: If you also use a saline nasal spray or rinse, use that first. Position the head with the chin slightly tucked. Use the right hand to spray into the left nostril and the right hand to spray into the left nostril.   Point the bottle away from the septum of your nose (cartilage that divides the two sides of your nose).  Hold the nostril closed on the opposite side from where you will spray Spray once and gently sniff to pull the medicine into the higher parts of your nose.  Don't sniff too hard as the medicine will drain down the back of your throat instead. Repeat with a second spray on the same side if prescribed. Repeat on the other side of your nose.  What are the benefits of quitting smoking? Quitting smoking can lower your chances of getting or dying from heart disease, lung disease, kidney failure, infection, or cancer. It can also lower your chances of getting osteoporosis, a condition that makes your bones weak. Plus, quitting smoking can help your skin look younger and reduce the chances that you will have problems with sex.  Quitting smoking will improve your health no matter how old you are, and no matter how long or how much you have smoked.  What should I do if I want to quit smoking? The letters in the word "START" can help you remember the steps to take: S = Set a quit date. T = Tell family, friends, and the people around you that you plan to  quit. A = Anticipate or plan ahead for the tough times you'll face while quitting. R = Remove cigarettes and other tobacco products from your home, car, and work. T = Talk to your doctor about getting help to quit.  How can my doctor or nurse help? Your doctor or nurse can give you advice on the best way to quit. He or she can also put you in touch with counselors or other people you can call for support. Plus, your doctor or nurse can give you medicines to: ?Reduce your craving for cigarettes ?Reduce the unpleasant symptoms that happen when you stop smoking (called "withdrawal symptoms"). You can also get help from a free phone line (1-800-QUIT-NOW) or go online to [facility].  What are the symptoms of withdrawal? The symptoms include: ?Trouble sleeping ?Being irritable, anxious or restless ?Getting frustrated or angry ?Having trouble thinking clearly  Some people who stop smoking become temporarily depressed. Some people need treatment for depression, such as counseling or antidepressant medicines. Depressed people might: ?No longer enjoy or care about doing the things they used to like to do ?Feel sad, down, hopeless, nervous, or cranky most of the day, almost every day ?Lose or gain weight ?Sleep too much or too little ?Feel tired or like they have no energy ?Feel guilty or like  they are worth nothing ?Forget things or feel confused ?Move and speak more slowly than usual ?Act restless or have trouble staying still ?Think about death or suicide  If you think you might be depressed, see your doctor or nurse. Only someone trained in mental health can tell for sure if you are depressed. If you ever feel like you might hurt yourself, go straight to the nearest emergency department. Or you can call for an ambulance (in the [place] and [place], [place] 9-1-1) or call your doctor or nurse right away and tell them it is an emergency. You can also reach the [place] National Suicide Prevention  Lifeline at (959)028-3342 or http://walker-sanchez.info/.  How do medicines help you stop smoking? Different medicines work in different ways: ?Nicotine replacement therapy eases withdrawal and reduces your body's craving for nicotine, the main drug found in cigarettes. There are different forms of nicotine replacement, including skin patches, lozenges, gum, nasal sprays, and "puffers" or inhalers. Many can be bought without a prescription, while others might require one. ?Bupropion is a prescription medicine that reduces your desire to smoke. This medicine is sold under the brand names Zyban and Wellbutrin. It is also available in a generic version, which is cheaper than brand name medicines. ?Varenicline (brand names: Chantix, Champix) is a prescription medicine that reduces withdrawal symptoms and cigarette cravings. If you think you'd like to take varenicline and you have a history of depression, anxiety, or heart disease, discuss this with your doctor or nurse before taking the medicine. Varenicline can also increase the effects of alcohol in some people. It's a good idea to limit drinking while you're taking it, at least until you know how it affects you.  How does counseling work? Counseling can happen during formal office visits or just over the phone. A counselor can help you: ?Figure out what triggers your smoking and what to do instead ?Overcome cravings ?Figure out what went wrong when you tried to quit before  What works best? Studies show that people have the best luck at quitting if they take medicines to help them quit and work with a Social worker. It might also be helpful to combine nicotine replacement with one of the prescription medicines that help people quit. In some cases, it might even make sense to take bupropion and varenicline together.  What about e-cigarettes? Sometimes people wonder if using electronic cigarettes, or "e-cigarettes," might help them quit smoking.  Using e-cigarettes is also called "vaping." Doctors do not recommend e-cigarettes in place of medicines and counseling. That's because e-cigarettes still contain nicotine as well as other substances that might be harmful. It's not clear how they can affect a person's health in the long term.  Will I gain weight if I quit? Yes, you might gain a few pounds. But quitting smoking will have a much more positive effect on your health than weighing a few pounds more. Plus, you can help prevent some weight gain by being more active and eating less. Taking the medicine bupropion might help control weight gain.   What else can I do to improve my chances of quitting? You can: ?Start exercising. ?Stay away from smokers and places that you associate with smoking. If people close to you smoke, ask them to quit with you. ?Keep gum, hard candy, or something to put in your mouth handy. If you get a craving for a cigarette, try one of these instead. ?Don't give up, even if you start smoking again. It takes most people a few  tries before they succeed.  What if I am pregnant and I smoke? If you are pregnant, it's really important for the health of your baby that you quit. Ask your doctor what options you have, and what is safest for your baby

## 2021-12-16 NOTE — Progress Notes (Signed)
Alejandra Ford    [id number]    03/02/1946  Primary Care Physician:Timberlake, [name], MD  Referring Physician: Glenis Smoker, MD [place],  [place] [id number] Reason for Consultation: abnormal CT Chest Date of Consultation: 12/16/2021  Chief complaint:   Chief Complaint  Patient presents with   Consult    Left lung mass.  Has had pna in July/aug.  Dry cough.  No sob.     HPI:  Alejandra Ford is a 76 y.o. woman with history of tobacco use disorder who presents for new patient evaluation for abnormal ct chest. Additional past medical history of colon cancer s/p colectomy (no chemo/rads) in 2022 and CAD s/p NSTEMI.  Had pneumonia in July treated with outpatient abx. Had lingering cough and had subsequent imaging which shows LLL opacity.   She notes that as the weather gets colder her cough is worsening and she had similar symptoms last year. Denies shortness of breath. This is her first time having pneumonia, denies recurrent bronchitis.   She does have runny nose.- does not take anything over the counter. No reflux. No dyspnea, chest tightness, wheezing.  She does not take any inhalers for her breathing.   Social history:  Occupation: office work.  Exposures: lives at home with husband. No pets Smoking history: 0.25 ppd today - Start smoking 21 x 1/2 ppd, quit for three years while pregnant (gradually cut down, cold [place]) 25 pack year smoking history.   Social History   Occupational History   Not on file  Tobacco Use   Smoking status: Some Days    Packs/day: 0.50    Years: 40.00    Total pack years: 20.00    Types: Cigarettes   Smokeless tobacco: Former   Tobacco comments:    Smokes 5 cigarettes/day.  Trying to quit.  10/12.2023 hfb  Vaping Use   Vaping Use: Never used  Substance and Sexual Activity   Alcohol use: Never   Drug use: Never   Sexual activity: Not on file    Relevant family history:  Family History  Problem  Relation Age of Onset   Lung cancer Neg Hx     Past Medical History:  Diagnosis Date   Arthritis    Cancer ([place])    colon   Dysrhythmia    HLD (hyperlipidemia)    Myocardial infarction ([place])    PAD (peripheral artery disease) ([place])    Pneumonia    Tobacco abuse     Past Surgical History:  Procedure Laterality Date   BIOPSY  09/10/2020   Procedure: BIOPSY;  Surgeon: Clarene Essex, MD;  Location: [facility];  Service: Endoscopy;;   COLONOSCOPY N/A 09/10/2020   Procedure: COLONOSCOPY;  Surgeon: Clarene Essex, MD;  Location: [facility];  Service: Endoscopy;  Laterality: N/A;   LEFT HEART CATH AND CORONARY ANGIOGRAPHY N/A 09/09/2020   Procedure: LEFT HEART CATH AND CORONARY ANGIOGRAPHY;  Surgeon: Wellington Hampshire, MD;  Location: [facility];  Service: Cardiovascular;  Laterality: N/A;   SUBMUCOSAL TATTOO INJECTION  09/10/2020   Procedure: SUBMUCOSAL TATTOO INJECTION;  Surgeon: Clarene Essex, MD;  Location: [facility];  Service: Endoscopy;;   wisodm teeth Bilateral      Physical Exam: Blood pressure (!) 100/50, pulse 72, temperature (!) 97.5 F (36.4 C), temperature source Oral, height '5\' 3"'$  (1.6 m), weight 107 lb 3.2 oz (48.6 kg), SpO2 97 %. Gen:      No acute  distress ENT:  mild cobblestoning, no nasal polyps, mucus membranes moist Lungs:    No increased respiratory effort, symmetric chest wall excursion, clear to auscultation bilaterally, no wheezes or crackles CV:         Regular rate and rhythm; no murmurs, rubs, or gallops.  No pedal edema Abd:      + bowel sounds; soft, non-tender; no distension MSK: no acute synovitis of DIP or PIP joints, no mechanics hands.  Skin:      Warm and dry; no rashes Neuro: normal speech, no focal facial asymmetry Psych: alert and oriented x3, normal mood and affect   Data Reviewed/Medical Decision Making:  Independent interpretation of tests: Imaging:  Review of patient's CT Chest Sept and July 2023 images revealed resolving LLL  cavitary lesion most consistent with resolving pneumonia. The patient's images have been independently reviewed by me.    PFTs:  Labs:  Lab Results  Component Value Date   WBC 12.9 (H) 10/23/2020   HGB 7.6 (L) 10/23/2020   HCT 24.5 (L) 10/23/2020   MCV 79.5 (L) 10/23/2020   PLT 427 (H) 10/23/2020   Lab Results  Component Value Date   NA 135 10/23/2020   K 3.9 10/23/2020   CL 103 10/23/2020   CO2 27 10/23/2020     Immunization status:  Immunization History  Administered Date(s) Administered   PFIZER(Purple Top)SARS-COV-2 Vaccination 04/19/2019, 05/12/2019     I reviewed prior external note(s) from cardiology, pcp  I reviewed the result(s) of the labs and imaging as noted above.   I have ordered repeat CT Chest.    Assessment:  Abnormal CT Chest, likely resolving pneumonia Chronic cough, post nasal drainage Tobacco use disorder.  History of colon cancer   Plan/Recommendations: Repeat CT Chest in 2 months to ensure resolution of cavitary lesion Can trial OTC anti-histamine and flonase for cough related symptoms She is pre-contemplative in smoking cessation, continue to address   Smoking Cessation Counseling:  1. The patient is an everyday smoker and symptomatic due to the following condition copd 2. The patient is currently pre-contemplative in quitting smoking. 3. I advised patient to quit smoking. 4. We identified patient specific barriers to change.  5. I personally spent 3 minutes counseling the patient regarding tobacco use disorder. 6. We discussed management of stress and anxiety to help with smoking cessation, when applicable. 7. We discussed nicotine replacement therapy, Wellbutrin, Chantix as possible options. 8. I advised setting a quit date. 9. Follow?up arranged with our office to continue ongoing discussions. 10.Resources given to patient including quit hotline.    We discussed disease management and progression at length today.    Return to  Care: Return in about 2 months (around 02/15/2022). With APP  Lenice Llamas, MD [facility]  CC: Glenis Smoker, *

## 2021-12-17 ENCOUNTER — Telehealth (HOSPITAL_BASED_OUTPATIENT_CLINIC_OR_DEPARTMENT_OTHER): Payer: Self-pay

## 2021-12-17 NOTE — Telephone Encounter (Signed)
   Pre-operative Risk Assessment    Patient Name: Alejandra Ford  DOB: 11-03-1945 MRN: [id number]      Request for Surgical Clearance    Procedure:   Colonoscopy  Date of Surgery:  Clearance 01/11/22                                 Surgeon:  Dr. Clarene Essex Surgeon's Group or Practice Name:  [facility] Phone number:  [phone] Fax number:  [phone]   Type of Clearance Requested:   - Medical    Type of Anesthesia:  Not Indicated   Additional requests/questions:   None  Barbaraann Faster   12/17/2021, 4:22 PM

## 2021-12-17 NOTE — Telephone Encounter (Signed)
   Name: Alejandra Ford  DOB: [birth year]  MRN: [id number]  Primary Cardiologist: Buford Dresser, MD  Chart reviewed as part of pre-operative protocol coverage. Because of Kailani Brass Faddis's past medical history and time since last visit, she will require a follow-up telephone visit in order to better assess preoperative cardiovascular risk.  Pre-op covering staff: - Please schedule appointment and call patient to inform them. If patient already had an upcoming appointment within acceptable timeframe, please add "pre-op clearance" to the appointment notes so provider is aware. - Please contact requesting surgeon's office via preferred method (i.e, phone, fax) to inform them of need for appointment prior to surgery.  No medications indicated as needing held.  Elgie Collard, PA-C  12/17/2021, 4:51 PM

## 2021-12-20 ENCOUNTER — Telehealth: Payer: Self-pay | Admitting: *Deleted

## 2021-12-20 NOTE — Telephone Encounter (Signed)
Pt agreeable to plan of care for tele pre op appt 01/06/22 @ 10:20. Med rec and consent are done.     Patient Consent for Virtual Visit        Alejandra Ford has provided verbal consent on 12/20/2021 for a virtual visit (video or telephone).   CONSENT FOR VIRTUAL VISIT FOR:  Alejandra Ford  By participating in this virtual visit I agree to the following:  I hereby voluntarily request, consent and authorize [facility] and its employed or contracted physicians, physician assistants, nurse practitioners or other licensed health care professionals (the Practitioner), to provide me with telemedicine health care services (the "Services") as deemed necessary by the treating Practitioner. I acknowledge and consent to receive the Services by the Practitioner via telemedicine. I understand that the telemedicine visit will involve communicating with the Practitioner through live audiovisual communication technology and the disclosure of certain medical information by electronic transmission. I acknowledge that I have been given the opportunity to request an in-person assessment or other available alternative prior to the telemedicine visit and am voluntarily participating in the telemedicine visit.  I understand that I have the right to withhold or withdraw my consent to the use of telemedicine in the course of my care at any time, without affecting my right to future care or treatment, and that the Practitioner or I may terminate the telemedicine visit at any time. I understand that I have the right to inspect all information obtained and/or recorded in the course of the telemedicine visit and may receive copies of available information for a reasonable fee.  I understand that some of the potential risks of receiving the Services via telemedicine include:  Delay or interruption in medical evaluation due to technological equipment failure or disruption; Information transmitted may not be sufficient  (e.g. poor resolution of images) to allow for appropriate medical decision making by the Practitioner; and/or  In rare instances, security protocols could fail, causing a breach of personal health information.  Furthermore, I acknowledge that it is my responsibility to provide information about my medical history, conditions and care that is complete and accurate to the best of my ability. I acknowledge that Practitioner's advice, recommendations, and/or decision may be based on factors not within their control, such as incomplete or inaccurate data provided by me or distortions of diagnostic images or specimens that may result from electronic transmissions. I understand that the practice of medicine is not an exact science and that Practitioner makes no warranties or guarantees regarding treatment outcomes. I acknowledge that a copy of this consent can be made available to me via my patient portal ([facility]), or I can request a printed copy by calling the office of [facility].    I understand that my insurance will be billed for this visit.   I have read or had this consent read to me. I understand the contents of this consent, which adequately explains the benefits and risks of the Services being provided via telemedicine.  I have been provided ample opportunity to ask questions regarding this consent and the Services and have had my questions answered to my satisfaction. I give my informed consent for the services to be provided through the use of telemedicine in my medical care

## 2021-12-20 NOTE — Telephone Encounter (Signed)
Pt agreeable to plan of care for tele pre op appt 01/06/22 @ 10:20. Med rec and consent are done.

## 2022-01-06 ENCOUNTER — Ambulatory Visit: Payer: Medicare Other | Attending: Cardiology | Admitting: Nurse Practitioner

## 2022-01-06 DIAGNOSIS — Z01810 Encounter for preprocedural cardiovascular examination: Secondary | ICD-10-CM | POA: Diagnosis not present

## 2022-01-06 NOTE — Progress Notes (Signed)
Virtual Visit via Telephone Note   Because of Alejandra Ford's co-morbid illnesses, she is at least at moderate risk for complications without adequate follow up.  This format is felt to be most appropriate for this patient at this time.  The patient did not have access to video technology/had technical difficulties with video requiring transitioning to audio format only (telephone).  All issues noted in this document were discussed and addressed.  No physical exam could be performed with this format.  Please refer to the patient's chart for her consent to telehealth for Alejandra Ford.  Evaluation Performed:  Preoperative cardiovascular risk assessment _____________   Date:  01/06/2022   Patient ID:  Alejandra Ford, [name] June 12, 1945, MRN [id number] Patient Location:  Home Provider location:   Office  Primary Care Provider:  Glenis Smoker, MD Primary Cardiologist:  Buford Dresser, MD  Chief Complaint / Patient Profile   76 y.o. y/o female with a h/o CAD, hx of NSTEMI, HLD, PAD, tobacco abuse, palpitations, dizziness, who is pending colonoscopy and presents today for telephonic preoperative cardiovascular risk assessment.  Past Medical History    Past Medical History:  Diagnosis Date   Arthritis    Cancer ([place])    colon   Dysrhythmia    HLD (hyperlipidemia)    Myocardial infarction ([place])    PAD (peripheral artery disease) ([place])    Pneumonia    Tobacco abuse    Past Surgical History:  Procedure Laterality Date   BIOPSY  09/10/2020   Procedure: BIOPSY;  Surgeon: Clarene Essex, MD;  Location: [place];  Service: Endoscopy;;   COLONOSCOPY N/A 09/10/2020   Procedure: COLONOSCOPY;  Surgeon: Clarene Essex, MD;  Location: [place];  Service: Endoscopy;  Laterality: N/A;   LEFT HEART CATH AND CORONARY ANGIOGRAPHY N/A 09/09/2020   Procedure: LEFT HEART CATH AND CORONARY ANGIOGRAPHY;  Surgeon: Wellington Hampshire, MD;  Location: [place] CV [facility];  Service:  Cardiovascular;  Laterality: N/A;   SUBMUCOSAL TATTOO INJECTION  09/10/2020   Procedure: SUBMUCOSAL TATTOO INJECTION;  Surgeon: Clarene Essex, MD;  Location: [facility] ENDOSCOPY;  Service: Endoscopy;;   wisodm teeth Bilateral     Allergies  Allergies  Allergen Reactions   Oxycodone Nausea And Vomiting    Hallucinations/nightmares Hallucinations/nightmares    Trazodone Anxiety    Nightmares    History of Present Illness    Alejandra Ford is a 76 y.o. female who presents via audio/video conferencing for a telehealth visit today.  Pt was last seen in cardiology clinic on August 12, 2021 by Dr. Harrell Gave.  At that time Alejandra Ford was overall doing well.  Had recently recovered from pneumonia.  She did note short periods of dizziness, but denied any syncope or falls.  Was smoking about 3 to 4 cigarettes/day.  Did note some palpitations, however getting up slowly seemed to help.  The patient is now pending procedure as outlined above.  Date of surgery is January 11, 2022.  Surgeon will be Dr. Clarene Essex of [facility].  Our office has been contacted regarding medical clearance.  Since her last visit, she has been doing good overall. Denies any chest pain, shortness of breath, palpitations, syncope, presyncope, dizziness, orthopnea, PND, bleeding, swelling, weight changes, or claudication.  Only notices stable, shortness of breath during the summertime from the heat.  Denies any other questions or concerns today.  Home Medications    Prior to Admission medications   Medication Sig Start Date End Date Taking? Authorizing  Provider  acetaminophen (TYLENOL) 500 MG tablet Take 500 mg by mouth every 6 (six) hours as needed for moderate pain.    [provider]  aspirin EC 81 MG tablet Take 81 mg by mouth daily. Swallow whole.    [provider]  atorvastatin (LIPITOR) 80 MG tablet Take 1 tablet (80 mg total) by mouth every evening. 09/11/20   Ghimire, Henreitta Leber, MD  betamethasone  dipropionate 0.05 % cream Apply 1 application topically daily as needed (eczema on feet).    [provider]  Cholecalciferol (VITAMIN D) 50 MCG (2000 UT) CAPS Take 2,000 Units by mouth at bedtime.    [provider]  fluticasone (FLONASE) 50 MCG/ACT nasal spray Place 1 spray into both nostrils daily. 12/16/21   Spero Geralds, MD  ibuprofen (ADVIL) 200 MG tablet Take 200 mg by mouth every 6 (six) hours as needed for moderate pain.    [provider]  metoprolol tartrate (LOPRESSOR) 25 MG tablet TAKE 1/2 TABLET(12.5 MG) BY MOUTH TWICE DAILY 05/07/21   Buford Dresser, MD  nitroGLYCERIN (NITROSTAT) 0.4 MG SL tablet Place 1 tablet (0.4 mg total) under the tongue every 5 (five) minutes x 3 doses as needed for chest pain. 09/11/20   Ghimire, Henreitta Leber, MD  polyethylene glycol (MIRALAX / GLYCOLAX) 17 g packet Take 17 g by mouth daily as needed for moderate constipation.    [provider]  traMADol (ULTRAM) 50 MG tablet Take 1-2 tablets (50-100 mg total) by mouth every 6 (six) hours as needed. 06/16/85   Leighton Ruff, MD    Physical Exam    Vital Signs:  Alejandra Ford does not have vital signs available for review today.  Given telephonic nature of communication, physical exam is limited. AAOx3. NAD. Normal affect.  Speech and respirations are unlabored.  Accessory Clinical Findings    None  Assessment & Plan    1.  Preoperative Cardiovascular Risk Assessment:     Ms. Esch perioperative risk of a major cardiac event is 0.9% according to the Revised Cardiac Risk Index (RCRI).  Therefore, she is at low risk for perioperative complications.   Her functional capacity is fair at 5.62 METs according to the Duke Activity Status Index (DASI). Recommendations: According to ACC/AHA guidelines, no further cardiovascular testing needed.  The patient may proceed to surgery at acceptable risk.   Antiplatelet and/or Anticoagulation Recommendations: From a cardiac  perspective, aspirin should be continued without interruption perioperatively.  She is not on any anticoagulation therapy.  She does not require SBE prophylaxis prior to procedure.  The patient was advised that if she develops new symptoms prior to surgery to contact our office to arrange for a follow-up visit, and she verbalized understanding.   A copy of this note will be routed to requesting surgeon.  Time:   Today, I have spent 10 minutes with the patient with telehealth technology discussing medical history, symptoms, and management plan.     Finis Bud, NP  01/06/2022, 11:21 AM

## 2022-01-18 ENCOUNTER — Ambulatory Visit
Admission: RE | Admit: 2022-01-18 | Discharge: 2022-01-18 | Disposition: A | Discharge status: Home / Self Care | Payer: Medicare Other | Source: Ambulatory Visit | Attending: Family Medicine | Admitting: Family Medicine

## 2022-01-18 DIAGNOSIS — E2839 Other primary ovarian failure: Secondary | ICD-10-CM

## 2022-01-18 DIAGNOSIS — Z1231 Encounter for screening mammogram for malignant neoplasm of breast: Secondary | ICD-10-CM

## 2022-02-09 ENCOUNTER — Ambulatory Visit
Admission: RE | Admit: 2022-02-09 | Discharge: 2022-02-09 | Disposition: A | Discharge status: Home / Self Care | Payer: Medicare Other | Source: Ambulatory Visit | Attending: Internal Medicine | Admitting: Internal Medicine

## 2022-02-09 DIAGNOSIS — R9389 Abnormal findings on diagnostic imaging of other specified body structures: Secondary | ICD-10-CM

## 2022-02-14 NOTE — Progress Notes (Signed)
Cardiology Office Note:    Date:  08/12/2021   ID:  Alejandra Ford, DOB [birth year], MRN [id number]  PCP:  Alejandra Smoker, MD  Cardiologist:  Alejandra Dresser, MD  Referring MD: Alejandra Ford, *   CC: follow up  History of Present Illness:    Alejandra Ford is a 76 y.o. female with a hx of hyperlipidemia, PAD, and tobacco abuse, CAD with history of NSTEMI who is seen for follow up. Her initial appointment 10/05/2020 had been scheduled as a new consult at the request of Alejandra Ford, * for the evaluation and management of palpitations, but in the interim she was admitted to the hospital and treated for NSTEMI.  At her last appointment she denied any recurring palpitations in the prior few weeks. She had some lightheadedness/dizziness after bending over. 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 She lost 92 lbs. Two weeks prior she began regaining some weight; she was 100 lbs that morning. She continued to work up to her baseline diet and was trying to walk daily.  Today: She has been scheduled for a chest CT at [facility] on 08/16/21.  This week, her palpitations began recurring in the mornings, intermittently for about 10-15 minutes. She also has a "couple little twinges" during these episodes. She has been trying to get up slowly, staying awake in bed for a time before getting up. This seems to help somewhat.  Of note, she is recovering from pneumonia (was ongoing for several months), and has been treated with antibiotics. At this time her breathing is stable. Her palpitations this week were not occurring with her pneumonia.  She endorses short periods of dizziness, but denies any falls or syncope.  Lately she has been more active because she is generally feeling better. She has not needed her nitroglycerin, and has not taken her pain medication for months.  Currently she is smoking 3-4  cigarettes a day.  She denies any peripheral edema. No headaches, orthopnea, or PND.   Past Medical History:  Diagnosis Date   Arthritis    Cancer ([place])    colon   Dysrhythmia    HLD (hyperlipidemia)    Myocardial infarction [facility])    PAD (peripheral artery disease) ([place])    Pneumonia    Tobacco abuse     Past Surgical History:  Procedure Laterality Date   BIOPSY  09/10/2020   Procedure: BIOPSY;  Surgeon: Alejandra Essex, MD;  Location: [place];  Service: Endoscopy;;   COLONOSCOPY N/A 09/10/2020   Procedure: COLONOSCOPY;  Surgeon: Alejandra Essex, MD;  Location: [place];  Service: Endoscopy;  Laterality: N/A;   LEFT HEART CATH AND CORONARY ANGIOGRAPHY N/A 09/09/2020   Procedure: LEFT HEART CATH AND CORONARY ANGIOGRAPHY;  Surgeon: Alejandra Hampshire, MD;  Location: [place] CV [facility];  Service: Cardiovascular;  Laterality: N/A;   SUBMUCOSAL TATTOO INJECTION  09/10/2020   Procedure: SUBMUCOSAL TATTOO INJECTION;  Surgeon: Alejandra Essex, MD;  Location: [facility] ENDOSCOPY;  Service: Endoscopy;;   wisodm teeth Bilateral     Current Medications: Current Outpatient Medications on File Prior to Visit  Medication Sig   acetaminophen (TYLENOL) 500 MG tablet Take 500 mg by mouth every 6 (six) hours as needed for moderate pain.   aspirin EC 81 MG tablet Take 81 mg by mouth daily. Swallow whole.   atorvastatin (LIPITOR) 80 MG tablet Take 1 tablet (80 mg total) by mouth every evening.  betamethasone dipropionate 0.05 % cream Apply 1 application topically daily as needed (eczema on feet).   Cholecalciferol (VITAMIN D) 50 MCG (2000 UT) CAPS Take 2,000 Units by mouth at bedtime.   ibuprofen (ADVIL) 200 MG tablet Take 200 mg by mouth every 6 (six) hours as needed for moderate pain.   metoprolol tartrate (LOPRESSOR) 25 MG tablet TAKE 1/2 TABLET(12.5 MG) BY MOUTH TWICE DAILY   nitroGLYCERIN (NITROSTAT) 0.4 MG SL tablet Place 1 tablet (0.4 mg total) under the tongue every 5 (five) minutes x 3 doses as  needed for chest pain.   polyethylene glycol (MIRALAX / GLYCOLAX) 17 g packet Take 17 g by mouth daily as needed for moderate constipation.   traMADol (ULTRAM) 50 MG tablet Take 1-2 tablets (50-100 mg total) by mouth every 6 (six) hours as needed.   No current facility-administered medications on file prior to visit.     Allergies:   Oxycodone, Prednisone, and Trazodone   Social History   Tobacco Use   Smoking status: Former    Packs/day: 0.10    Types: Cigarettes   Smokeless tobacco: Former  Scientific laboratory technician Use: Never used  Substance Use Topics   Alcohol use: Never   Drug use: Never    Family History: No family history of CAD.  ROS:   Please see the history of present illness. (+) Palpitations (+) Chest discomfort (+) Dizziness All other systems are reviewed and negative.    EKGs/Labs/Other Studies Reviewed:    The following studies were reviewed today:  LHC 09/09/2020: Dist Cx lesion is 100% stenosed. Prox RCA lesion is 50% stenosed.   1.  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  There is also moderate proximal RCA disease. 2.  Left ventricular angiography was not performed.  EF was low normal by echo with posterior wall hypokinesis.  Mildly elevated left ventricular end-diastolic pressure.   Recommendations: This is a late presenting posterior myocardial infarction.  The occlusion involves distal left circumflex which has already collateralized.  The patient is chest pain-free and thus no indication for revascularization at this point.  I recommend continuing medical therapy. I discontinued heparin drip. The patient can proceed with endoscopic GI procedures for diagnosis of colon mass at an overall low to moderate risk.  Echo 09/09/2020:  1. Left ventricular ejection fraction, by estimation, is 50 to 55%. The  left ventricle has low normal function. The left ventricle  demonstrates  regional wall motion abnormalities (see scoring diagram/findings for  description). Left ventricular diastolic parameters are indeterminate. Elevated left ventricular end-diastolic pressure. There is hypokinesis of the left ventricular, mid-apical inferior wall, inferolateral wall and anterolateral wall. There is akinesis of the left ventricular, apical inferior  wall.   2. Right ventricular systolic function is normal. The right ventricular  size is normal. Tricuspid regurgitation signal is inadequate for assessing  PA pressure.   3. The mitral valve is normal in structure. Mild mitral valve  regurgitation. No evidence of mitral stenosis.   4. The aortic valve is tricuspid. There is mild calcification of the  aortic valve. There is mild thickening of the aortic valve. Aortic valve  regurgitation is not visualized. Mild aortic valve sclerosis is present,  with no evidence of aortic valve stenosis.   5. The inferior vena cava is normal in size with greater than 50%  respiratory variability, suggesting right atrial pressure of 3 mmHg.   Comparison(s): Prior images  unable to be directly viewed, comparison made by report only.   Conclusion(s)/Recommendation(s): Wall motion abnormalities in the lateral and inferior wall. Results communicated to primary team, cardiology team also aware.   CT Chest/Abdomen/Pelvis 08/28/2020: COMPARISON:  CTA, 06/18/2008.  Cardiovascular: Heart normal in size and configuration. No pericardial effusion. Mild three-vessel coronary artery calcifications. Great vessels are normal in caliber. Thoracic aorta without dissection. Thoracic aortic atherosclerosis most evident along the descending portion. Branch vessels are widely patent.    Vascular/Lymphatic: Aortic atherosclerosis. Aortic bypass beginning at the infrarenal portion with widely patent common iliac limbs. Significant atherosclerosis noted of the native external iliac and internal iliac  arteries. There is a thrombosed by femoral artery graft crossing the low anterior abdomen.   IMPRESSION: 1. 7.5 cm long circumferential mass of the lower ascending colon just above the ileocecal valve consistent with malignancy. 2. No evidence of metastatic disease in the abdomen or pelvis. 3. There are scattered tiny lung nodules are most likely areas of scarring. Metastatic lesions are not excluded, but felt unlikely. Close attention on follow-up imaging recommended. 4. No acute abnormalities within the chest, abdomen or pelvis. 5. Aortic atherosclerosis. No aneurysm or dissection. Bypass graft from the aorta to the iliac arteries is widely patent. Thrombosed by femoral artery bypass graft.  EKG:  EKG is personally reviewed.   08/12/2021:  not ordered today 02/12/2021: not ordered 10/05/2020: NSR with TWI in inferiorlateral leads  Recent Labs: 09/09/2020: ALT 15; TSH 1.431 10/23/2020: BUN 9; Creatinine, Ser 0.70; Hemoglobin 7.6; Platelets 427; Potassium 3.9; Sodium 135   Recent Lipid Panel    Component Value Date/Time   CHOL 122 09/09/2020 0726   TRIG 86 09/09/2020 0726   HDL 45 09/09/2020 0726   CHOLHDL 2.7 09/09/2020 0726   VLDL 17 09/09/2020 0726   LDLCALC 60 09/09/2020 0726    Physical Exam:    VS:  BP 120/66   Pulse 72   Ht '5\' 3"'$  (1.6 m)   Wt 98 lb 6.4 oz (44.6 kg)   SpO2 99%   BMI 17.43 kg/m     Wt Readings from Last 3 Encounters:  08/12/21 98 lb 6.4 oz (44.6 kg)  05/13/21 97 lb 12.8 oz (44.4 kg)  02/12/21 100 lb (45.4 kg)    GEN: Thin, well developed in no acute distress HEENT: Normal, moist mucous membranes NECK: No JVD CARDIAC: regular rhythm, normal S1 and S2, no rubs or gallops. No murmur. VASCULAR: Radial and DP pulses 2+ bilaterally. No carotid bruits RESPIRATORY:  Clear to auscultation without rales, wheezing or rhonchi  ABDOMEN: Soft, non-tender, non-distended MUSCULOSKELETAL:  Ambulates independently SKIN: Warm and dry, no edema NEUROLOGIC:  Alert  and oriented x 3. No focal neuro deficits noted. PSYCHIATRIC:  Normal affect    ASSESSMENT:    1. Heart palpitations   2. Coronary artery disease involving native coronary artery of native heart without angina pectoris   3. History of non-ST elevation myocardial infarction (NSTEMI)   4. PAD (peripheral artery disease) ([place])   5. Pure hypercholesterolemia   6. Tobacco abuse counseling     PLAN:    Palpitations -discussed symptoms, options for further evaluation with event monitor or Kardiamobile -for now, she will monitor symptoms and contact me if things worsen -reviewed red flag warning signs that need immediate medical attention  Coronary disease, without angina History of NSTEMI History of PAD Pure hypercholesterolemia, LDL goal <70 -chest pain free, reviewed her anginal equivalent -echo, cath results as above -tolerating aspirin, atorvastatin, metoprolol -last  LDL 50  Tobacco abuse counseling: The patient was counseled on tobacco cessation today for 4 minutes.  Counseling included reviewing the risks of smoking tobacco products, how it impacts the patient's current medical diagnoses and different strategies for quitting.  Pharmacotherapy to aid in tobacco cessation was not prescribed today.   Cardiac risk counseling and prevention recommendations: -recommend heart healthy/Mediterranean diet, with whole grains, fruits, vegetable, fish, lean meats, nuts, and olive oil. Limit salt. -recommend moderate walking, 3-5 times/week for 30-50 minutes each session. Aim for at least 150 minutes.week. Goal should be pace of 3 miles/hours, or walking 1.5 miles in 30 minutes -recommend avoidance of tobacco products. Avoid excess alcohol. -ASCVD risk score: The ASCVD Risk score (Arnett DK, et al., 2019) failed to calculate for the following reasons:   The patient has a prior MI or stroke diagnosis    Plan for follow up: 6 months or sooner as needed.  Alejandra Dresser, MD, PhD,  [facility]    Medication Adjustments/Labs and Tests Ordered: Current medicines are reviewed at length with the patient today.  Concerns regarding medicines are outlined above.   No orders of the defined types were placed in this encounter.  No orders of the defined types were placed in this encounter.  Patient Instructions  Medication Instructions:  Your Physician recommend you continue on your current medication as directed.    *If you need a refill on your cardiac medications before your next appointment, please call your pharmacy*   Lab Work: None ordered today   Testing/Procedures: None ordered today   Follow-Up: At [facility], you and your health needs are our priority.  As part of our continuing mission to provide you with exceptional heart care, we have created designated Provider Care Teams.  These Care Teams include your primary Cardiologist (physician) and Advanced Practice Providers (APPs -  Physician Assistants and Nurse Practitioners) who all work together to provide you with the care you need, when you need it.  We recommend signing up for the patient portal called "MyChart".  Sign up information is provided on this After Visit Summary.  MyChart is used to connect with patients for Virtual Visits (Telemedicine).  Patients are able to view lab/test results, encounter notes, upcoming appointments, etc.  Non-urgent messages can be sent to your provider as well.   To learn more about what you can do with MyChart, go to [email or web address].    Your next appointment:   6 month(s)  The format for your next appointment:   In Person  Provider:   Buford Dresser, MD            [facility],acting as a scribe for Alejandra Dresser, MD.,have documented all relevant documentation on the behalf of Alejandra Dresser, MD,as directed by  Alejandra Dresser, MD while in the presence of Alejandra Dresser, MD.  I,  Alejandra Dresser, MD, have reviewed all documentation for this visit. The documentation on 08/12/21 for the exam, diagnosis, procedures, and orders are all accurate and complete.   Signed, Alejandra Dresser, MD PhD 08/12/2021     [place]

## 2022-02-15 ENCOUNTER — Ambulatory Visit: Payer: Medicare Other | Admitting: Acute Care

## 2022-02-15 ENCOUNTER — Encounter: Payer: Self-pay | Admitting: Acute Care

## 2022-02-15 ENCOUNTER — Encounter (HOSPITAL_BASED_OUTPATIENT_CLINIC_OR_DEPARTMENT_OTHER): Payer: Self-pay | Admitting: Cardiology

## 2022-02-15 ENCOUNTER — Ambulatory Visit (HOSPITAL_BASED_OUTPATIENT_CLINIC_OR_DEPARTMENT_OTHER): Payer: Medicare Other | Admitting: Cardiology

## 2022-02-15 VITALS — BP 130/70 | HR 74 | Temp 97.7°F | Ht 63.5 in | Wt 109.4 lb

## 2022-02-15 VITALS — BP 122/72 | HR 75 | Ht 63.5 in | Wt 107.8 lb

## 2022-02-15 DIAGNOSIS — F17210 Nicotine dependence, cigarettes, uncomplicated: Secondary | ICD-10-CM | POA: Diagnosis not present

## 2022-02-15 DIAGNOSIS — Z716 Tobacco abuse counseling: Secondary | ICD-10-CM

## 2022-02-15 DIAGNOSIS — R911 Solitary pulmonary nodule: Secondary | ICD-10-CM

## 2022-02-15 DIAGNOSIS — R002 Palpitations: Secondary | ICD-10-CM

## 2022-02-15 DIAGNOSIS — I2510 Atherosclerotic heart disease of native coronary artery without angina pectoris: Secondary | ICD-10-CM | POA: Diagnosis not present

## 2022-02-15 DIAGNOSIS — E7800 Pure hypercholesterolemia, unspecified: Secondary | ICD-10-CM

## 2022-02-15 DIAGNOSIS — I252 Old myocardial infarction: Secondary | ICD-10-CM

## 2022-02-15 DIAGNOSIS — I739 Peripheral vascular disease, unspecified: Secondary | ICD-10-CM

## 2022-02-15 NOTE — Progress Notes (Signed)
History of Present Illness Alejandra Ford is a 76 y.o. female current some day smoker seen by Dr. Shearon Stalls 12/16/2021 for a left lung mass found on a CT Chest. She was referred by her PCP Dr. Sela Hilding .   Additional past medical history of colon cancer s/p colectomy (no chemo/rads) in 2022 and CAD s/p NSTEMI.  Of note, she had pneumonia July 2023, which was treated with outpatient abx.Patient  had a lingering cough and had subsequent imaging which showed LLL opacity.  She notes that as the weather gets colder her cough is worsening and she had similar symptoms last year. Denies shortness of breath. This is her first time having pneumonia, denies recurrent bronchitis  She is not on any maintenance inhalers.   Social history:   Occupation: office work.  Exposures: lives at home with husband. No pets Smoking history: 0.25 ppd today - Start smoking 21 x 1/2 ppd, quit for three years while pregnant (gradually cut down, cold [place]) 25 pack year smoking history.      02/15/2022 Pt. Presents for follow up of abnormal CT chest after pneumonia. She has had serial CT's of her chest which have shown resolving of the ground glass opacity in her left lower lobe. There is no recommendation for follow up. She does meet criteria for annual lung cancer screening which we can start 02/2023, as she has had CT imaging this year. She wants to check with her PCP to ensure she is in agreement. I told her I would place referral , and if she decides she does not want to be screened she can call us before next December. She is in agreement with this plan.  She does continue to smoke. I have counseled her to quit. She does not endorse any dyspnea , chest pain, or orthopnea.   Test Results: 02/10/2022 CT Chest   Further resolution of previously noted ground-glass density in the left lower lobe. Residual focal bronchiectasis and associated somewhat linear density in the posterior left lung base, felt consistent  with post inflammatory/infectious scarring. Multiple punctate 1-2 mm pulmonary nodules are grossly unchanged. No specific imaging follow-up is recommended. Aortic atherosclerosis.  11/11/2021 CT Chest Area of posterior basal left lower lobe consolidation and pleurodiaphragmatic adhesion with bronchiectasis, appears more scar-like today and is smaller in size, but there is persistent surrounding ground-glass opacity. This is less dense than previously but persistent which could indicate unresolved pneumonitis or ground-glass scarring. Three-month follow-up CT recommended for recharacterization. Scattered mucoid debris in the trachea. Stable subpleural micronodules in the upper lobes, likely due to small airways disease. Emphysema. Aortic and coronary artery atherosclerosis with heavily calcified superior mitral ring.  CT Chest 09/30/2021 Focal left lower lobe airspace disease has decreased, but has not completely resolved. Follow-up CT recommended in 4-6 weeks to confirm resolution.   Right upper lobe ground-glass opacities have completely resolved.   Minimal secretions in the trachea and right mainstem bronchus.   Stable pulmonary micro nodules. No follow-up needed if patient is low-risk (and has no known or suspected primary neoplasm). Non-contrast chest CT can be considered in 12 months if patient is high-risk. This recommendation follows the consensus statement: Guidelines for Management of Incidental Pulmonary Nodules Detected on CT Images:  CT Chest 08/16/2021 A few scattered tiny less than 5 mm pulmonary nodules are again seen bilaterally which remain stable. No suspicious nodules or masses identified. New pulmonary consolidation with central air bronchograms and small area of cavitation is seen in the posterior  left lower lobe, highly suspicious for pneumonia. New mild ground-glass opacity is also seen in the anterior right middle lobe. No evidence of pleural  effusion.      Latest Ref Rng & Units 10/23/2020    4:23 AM 10/22/2020    5:22 AM 10/13/2020   11:58 AM  CBC  WBC 4.0 - 10.5 K/uL 12.9  13.3  10.0   Hemoglobin 12.0 - 15.0 g/dL 7.6  7.2  9.2   Hematocrit 36.0 - 46.0 % 24.5  23.0  29.2   Platelets 150 - 400 K/uL 427  396  547        Latest Ref Rng & Units 10/23/2020    4:23 AM 10/22/2020    5:22 AM 10/13/2020   11:58 AM  BMP  Glucose 70 - 99 mg/dL 91  157  105   BUN 8 - 23 mg/dL '9  6  16   '$ Creatinine 0.44 - 1.00 mg/dL 0.70  0.68  0.81   Sodium 135 - 145 mmol/L 135  134  133   Potassium 3.5 - 5.1 mmol/L 3.9  3.6  4.1   Chloride 98 - 111 mmol/L 103  102  95   CO2 22 - 32 mmol/L '27  25  26   '$ Calcium 8.9 - 10.3 mg/dL 8.7  8.7  9.0     BNP No results found for: "BNP"  ProBNP No results found for: "PROBNP"  PFT No results found for: "FEV1PRE", "FEV1POST", "FVCPRE", "FVCPOST", "TLC", "DLCOUNC", "PREFEV1FVCRT", "PSTFEV1FVCRT"  CT Chest Wo Contrast  Result Date: 02/10/2022 CLINICAL DATA:  Follow-up nodules history of colon cancer EXAM: CT CHEST WITHOUT CONTRAST TECHNIQUE: Multidetector CT imaging of the chest was performed following the standard protocol without IV contrast. RADIATION DOSE REDUCTION: This exam was performed according to the departmental dose-optimization program which includes automated exposure control, adjustment of the mA and/or kV according to patient size and/or use of iterative reconstruction technique. COMPARISON:  CT chest 11/11/2021, 09/30/2021, 08/16/2021, 08/28/2020 FINDINGS: Cardiovascular: Limited evaluation without intravenous contrast. Moderate aortic atherosclerosis. No aneurysm. Coronary vascular calcification. Normal cardiac size. No pericardial effusion. Mediastinum/Nodes: Midline trachea. No thyroid mass. No suspicious lymph nodes. Esophagus within normal limits. Lungs/Pleura: Multiple punctate 1-2 mm pulmonary nodules are grossly unchanged. The largest nodule is seen in the subpleural left upper lobe  laterally and measures 3 mm, series 5, image 64. Further resolution of previously noted ground-glass density in the left lower lobe. Residual focal bronchiectasis and surrounding somewhat linear density in the posterior left lung base, felt consistent with post inflammatory/infectious scarring. Upper Abdomen: No acute finding Musculoskeletal: No suspicious osseous lesion IMPRESSION: 1. Further resolution of previously noted ground-glass density in the left lower lobe. Residual focal bronchiectasis and associated somewhat linear density in the posterior left lung base, felt consistent with post inflammatory/infectious scarring. 2. Multiple punctate 1-2 mm pulmonary nodules are grossly unchanged. No specific imaging follow-up is recommended. 3. Aortic atherosclerosis. Aortic Atherosclerosis (ICD10-I70.0). Electronically Signed   By: Donavan Foil M.D.   On: 02/10/2022 23:43   MM 3D SCREEN BREAST BILATERAL  Result Date: 01/20/2022 CLINICAL DATA:  Screening. EXAM: DIGITAL SCREENING BILATERAL MAMMOGRAM WITH TOMOSYNTHESIS AND CAD TECHNIQUE: Bilateral screening digital craniocaudal and mediolateral oblique mammograms were obtained. Bilateral screening digital breast tomosynthesis was performed. The images were evaluated with computer-aided detection. COMPARISON:  Previous exam(s). ACR Breast Density Category c: The breast tissue is heterogeneously dense, which may obscure small masses. FINDINGS: There are no findings suspicious for malignancy. IMPRESSION: No mammographic evidence of  malignancy. A result letter of this screening mammogram will be mailed directly to the patient. RECOMMENDATION: Screening mammogram in one year. (Code:SM-B-01Y) BI-RADS CATEGORY  1: Negative. Electronically Signed   By: Nolon Nations M.D.   On: 01/20/2022 14:00   DG Bone Density  Result Date: 01/18/2022 EXAM: DUAL X-RAY ABSORPTIOMETRY (DXA) FOR BONE MINERAL DENSITY IMPRESSION: Referring Physician:  Glenis Smoker Your patient  completed a bone mineral density test using GE Lunar iDXA system (analysis version: 16). Technologist: lmn PATIENT: Name: Marise, Knapper Patient ID:  [id number] Birth Date: 1945-10-29 Height:     63.0 in. Sex:         Female Measured:   01/18/2022 Weight:     106.2 lbs. Indications: Advanced Age, Estrogen Deficient, Low Body Weight (783.22), Postmenopausal, Tobacco User (Current Smoker) Fractures: NONE Treatments: Vitamin D (E933.5) ASSESSMENT: The BMD measured at AP Spine L1-L4 is 0.872 g/cm2 with a T-score of -2.6. This patient is considered osteoporotic according to [facility]) criteria. The quality of the exam is good. Site Region Measured Date Measured Age YA BMD Significant CHANGE T-score AP Spine  L1-L4      01/18/2022    76.1         -2.6    0.872 g/cm2 DualFemur Neck Left  01/18/2022    75.1         -1.6    0.819 g/cm2 DualFemur Total Mean 01/18/2022    76.1         -2.3    0.720 g/cm2 World Health Organization [facility]) criteria for post-menopausal, Caucasian Women: Normal       T-score at or above -1 SD Osteopenia   T-score between -1 and -2.5 SD Osteoporosis T-score at or below -2.5 SD RECOMMENDATION: 1. All patients should optimize calcium and vitamin D intake. 2. Consider FDA-approved medical therapies in postmenopausal women and men aged 54 years and older, based on the following: a. A hip or vertebral (clinical or morphometric) fracture. b. T-score = -2.5 at the femoral neck or spine after appropriate evaluation to exclude secondary causes. c. Low bone mass (T-score between -1.0 and -2.5 at the femoral neck or spine) and a 10-year probability of a hip fracture = 3% or a 10-year probability of a major osteoporosis-related fracture = 20% based on the [place]-adapted WHO algorithm. d. Clinician judgment and/or patient preferences may indicate treatment for people with 10-year fracture probabilities above or below these levels. FOLLOW-UP: Patients with diagnosis of osteoporosis or at high risk  for fracture should have regular bone mineral density tests. Patients eligible for Medicare are allowed routine testing every 2 years. The testing frequency can be increased to one year for patients who have rapidly progressing disease, are receiving or discontinuing medical therapy to restore bone mass, or have additional risk factors. I have reviewed this study and agree with the findings. [facility] Electronically Signed   By: Ammie Ferrier M.D.   On: 01/18/2022 13:43     Past medical hx Past Medical History:  Diagnosis Date   Arthritis    Cancer ([place])    colon   Dysrhythmia    HLD (hyperlipidemia)    Myocardial infarction ([place])    PAD (peripheral artery disease) ([facility])    Pneumonia    Tobacco abuse      Social History   Tobacco Use   Smoking status: Some Days    Packs/day: 0.50    Years: 40.00    Total pack years: 20.00  Types: Cigarettes   Smokeless tobacco: Former   Tobacco comments:    Smokes 5 cigarettes/day.  Trying to quit.  10/12.2023 hfb  Vaping Use   Vaping Use: Never used  Substance Use Topics   Alcohol use: Never   Drug use: Never    Ms.Sweatman reports that she has been smoking cigarettes. She has a 20.00 pack-year smoking history. She has quit using smokeless tobacco. She reports that she does not drink alcohol and does not use drugs.  Tobacco Cessation: Current every day smoker with a 25 pack year history  Past surgical hx, Family hx, Social hx all reviewed.  Current Outpatient Medications on File Prior to Visit  Medication Sig   acetaminophen (TYLENOL) 500 MG tablet Take 500 mg by mouth every 6 (six) hours as needed for moderate pain.   aspirin EC 81 MG tablet Take 81 mg by mouth daily. Swallow whole.   atorvastatin (LIPITOR) 80 MG tablet Take 1 tablet (80 mg total) by mouth every evening.   betamethasone dipropionate 0.05 % cream Apply 1 application topically daily as needed (eczema on feet).   Cholecalciferol (VITAMIN D) 50 MCG  (2000 UT) CAPS Take 2,000 Units by mouth at bedtime.   fluticasone (FLONASE) 50 MCG/ACT nasal spray Place 1 spray into both nostrils daily.   ibuprofen (ADVIL) 200 MG tablet Take 200 mg by mouth every 6 (six) hours as needed for moderate pain.   metoprolol tartrate (LOPRESSOR) 25 MG tablet TAKE 1/2 TABLET(12.5 MG) BY MOUTH TWICE DAILY   nitroGLYCERIN (NITROSTAT) 0.4 MG SL tablet Place 1 tablet (0.4 mg total) under the tongue every 5 (five) minutes x 3 doses as needed for chest pain.   polyethylene glycol (MIRALAX / GLYCOLAX) 17 g packet Take 17 g by mouth daily as needed for moderate constipation.   traMADol (ULTRAM) 50 MG tablet Take 1-2 tablets (50-100 mg total) by mouth every 6 (six) hours as needed.   No current facility-administered medications on file prior to visit.     Allergies  Allergen Reactions   Oxycodone Nausea And Vomiting    Hallucinations/nightmares Hallucinations/nightmares    Trazodone Anxiety    Nightmares    Review Of Systems:  Constitutional:   No  weight loss, night sweats,  Fevers, chills, fatigue, or  lassitude.  HEENT:   No headaches,  Difficulty swallowing,  Tooth/dental problems, or  Sore throat,                No sneezing, itching, ear ache, nasal congestion, post nasal drip,   CV:  No chest pain,  Orthopnea, PND, swelling in lower extremities, anasarca, dizziness, palpitations, syncope.   GI  No heartburn, indigestion, abdominal pain, nausea, vomiting, diarrhea, change in bowel habits, loss of appetite, bloody stools.   Resp: No shortness of breath with exertion or at rest.  No excess mucus, no productive cough,  No non-productive cough,  No coughing up of blood.  No change in color of mucus.  No wheezing.  No chest wall deformity  Skin: no rash or lesions.  GU: no dysuria, change in color of urine, no urgency or frequency.  No flank pain, no hematuria   MS:  No joint pain or swelling.  No decreased range of motion.  No back pain.  Psych:  No  change in mood or affect. No depression or anxiety.  No memory loss.   Vital Signs BP 130/70 (BP Location: Right Arm, Cuff Size: Normal)   Pulse 74   Temp 97.7 F (  36.5 C) (Oral)   Ht 5' 3.5" (1.613 m)   Wt 109 lb 6.4 oz (49.6 kg)   SpO2 97%   BMI 19.08 kg/m    Physical Exam:  General- No distress,  A&Ox3, pleasant ENT: No sinus tenderness, TM clear, pale nasal mucosa, no oral exudate,no post nasal drip, no LAN Cardiac: S1, S2, regular rate and rhythm, no murmur Chest: No wheeze/ rales/ dullness; no accessory muscle use, no nasal flaring, no sternal retractions Abd.: Soft Non-tender, ND, BS +, Body mass index is 19.08 kg/m.  Ext: No clubbing cyanosis, edema Neuro:  normal strength, MAE x 4, A&O x 3 Skin: No rashes, warm and dry, no lesions  Psych: normal mood and behavior   Assessment/Plan Lung nodule on CT Chest >> resolving  Recent resolved pneumonia Some day smoker  Plan Your CT Chest shows continued improvement.  This is great news. We will refer you for lung cancer screening starting 02/2023. You will get a call to schedule this closer to the time. Talk with your PCP to ensure she is in agreement with screening.  Please continue to work on quitting smoking. You can call 1-800-quit now  for free nicotine replacement therapy.  Call if you need [place] for anything.  Please contact office for sooner follow up if symptoms do not improve or worsen or seek emergency care     I spent 35 minutes dedicated to the care of this patient on the date of this encounter to include pre-visit review of records, face-to-face time with the patient discussing conditions above, post visit ordering of testing, clinical documentation with the electronic health record, making appropriate referrals as documented, and communicating necessary information to the patient's healthcare team.   Magdalen Spatz, NP 02/15/2022  10:19 AM

## 2022-02-15 NOTE — Patient Instructions (Addendum)
It is good to see you today. Your CT Chest shows continued improvement.  This is great news. We will refer you for lung cancer screening starting 02/2023. You will get a call to schedule this closer to the time. Talk with your PCP to ensure she is in agreement with screening.  Please continue to work on quitting smoking. You can call 1-800-quit now  for free nicotine replacement therapy.  Call if you need [place] for anything.  Please contact office for sooner follow up if symptoms do not improve or worsen or seek emergency care

## 2022-02-15 NOTE — Patient Instructions (Signed)
Medication Instructions:  Your Physician recommend you continue on your current medication as directed.    *If you need a refill on your cardiac medications before your next appointment, please call your pharmacy*  Follow-Up: At [facility], you and your health needs are our priority.  As part of our continuing mission to provide you with exceptional heart care, we have created designated Provider Care Teams.  These Care Teams include your primary Cardiologist (physician) and Advanced Practice Providers (APPs -  Physician Assistants and Nurse Practitioners) who all work together to provide you with the care you need, when you need it.  We recommend signing up for the patient portal called "MyChart".  Sign up information is provided on this After Visit Summary.  MyChart is used to connect with patients for Virtual Visits (Telemedicine).  Patients are able to view lab/test results, encounter notes, upcoming appointments, etc.  Non-urgent messages can be sent to your provider as well.   To learn more about what you can do with MyChart, go to [email or web address].    Your next appointment:   6 month(s)  The format for your next appointment:   In Person  Provider:   Buford Dresser, MD

## 2022-02-16 ENCOUNTER — Encounter (HOSPITAL_BASED_OUTPATIENT_CLINIC_OR_DEPARTMENT_OTHER): Payer: Self-pay | Admitting: Cardiology

## 2022-05-17 ENCOUNTER — Telehealth: Payer: Self-pay | Admitting: Cardiology

## 2022-05-17 MED ORDER — METOPROLOL TARTRATE 25 MG PO TABS: ORAL_TABLET | ORAL | 2 refills | Status: DC

## 2022-05-17 NOTE — Telephone Encounter (Signed)
*  STAT* If patient is at the pharmacy, call can be transferred to refill team.   1. Which medications need to be refilled? (please list name of each medication and dose if known) metoprolol tartrate (LOPRESSOR) 25 MG tablet   2. Which pharmacy/location (including street and city if local pharmacy) is medication to be sent to?  [id number] - [place], [place] - [address] [place] [address] [place] [address]    3. Do they need a 30 day or 90 day supply? 90  Patient only has 1 day left.

## 2022-06-03 ENCOUNTER — Encounter: Payer: Self-pay | Admitting: Oncology

## 2022-06-03 ENCOUNTER — Inpatient Hospital Stay: Payer: Medicare Other | Attending: Oncology

## 2022-06-03 ENCOUNTER — Inpatient Hospital Stay: Payer: Medicare Other | Admitting: Oncology

## 2022-06-03 VITALS — BP 133/69 | HR 73 | Temp 97.8°F | Resp 18 | Ht 63.0 in | Wt 109.8 lb

## 2022-06-03 DIAGNOSIS — I2510 Atherosclerotic heart disease of native coronary artery without angina pectoris: Secondary | ICD-10-CM | POA: Diagnosis not present

## 2022-06-03 DIAGNOSIS — I252 Old myocardial infarction: Secondary | ICD-10-CM | POA: Diagnosis not present

## 2022-06-03 DIAGNOSIS — E785 Hyperlipidemia, unspecified: Secondary | ICD-10-CM | POA: Diagnosis not present

## 2022-06-03 DIAGNOSIS — Z85038 Personal history of other malignant neoplasm of large intestine: Secondary | ICD-10-CM | POA: Diagnosis present

## 2022-06-03 DIAGNOSIS — C182 Malignant neoplasm of ascending colon: Secondary | ICD-10-CM

## 2022-06-03 DIAGNOSIS — R918 Other nonspecific abnormal finding of lung field: Secondary | ICD-10-CM | POA: Diagnosis not present

## 2022-06-03 DIAGNOSIS — I739 Peripheral vascular disease, unspecified: Secondary | ICD-10-CM | POA: Insufficient documentation

## 2022-06-03 DIAGNOSIS — D509 Iron deficiency anemia, unspecified: Secondary | ICD-10-CM | POA: Insufficient documentation

## 2022-06-03 LAB — CEA (ACCESS): CEA (CHCC): 3.26 ng/mL (ref 0.00–5.00)

## 2022-06-03 NOTE — Progress Notes (Signed)
  [place] OFFICE PROGRESS NOTE   Diagnosis: Colon cancer  INTERVAL HISTORY:   Alejandra Ford returns as scheduled.  Good appetite.  No difficulty with bowel function.  She smokes 3 cigarettes/day. She underwent a surveillance colonoscopy on 02/21/2022.  4 polyps were removed from the rectum. Objective:  Vital signs in last 24 hours:  Blood pressure 133/69, pulse 73, temperature 97.8 F (36.6 C), temperature source Oral, resp. rate 18, height 5\' 3"  (1.6 m), weight 109 lb 12.8 oz (49.8 kg), SpO2 100 %.    Lymphatics: No cervical, supraclavicular, axillary, or inguinal nodes.  Inclusion cysts in the right axilla, Resp: Inspiratory rhonchi at the left posterior chest, no respiratory distress Cardio: Giller rate and rhythm GI: No hepatosplenomegaly, no mass, nontender, nodularity at the left greater than right low abdomen lateral to the transverse incision (this likely represents the bypass graft) Vascular: No leg edema   Lab Results:  Lab Results  Component Value Date   WBC 12.9 (H) 10/23/2020   HGB 7.6 (L) 10/23/2020   HCT 24.5 (L) 10/23/2020   MCV 79.5 (L) 10/23/2020   PLT 427 (H) 10/23/2020   NEUTROABS 5.8 05/23/2007    CMP  Lab Results  Component Value Date   NA 135 10/23/2020   K 3.9 10/23/2020   CL 103 10/23/2020   CO2 27 10/23/2020   GLUCOSE 91 10/23/2020   BUN 9 10/23/2020   CREATININE 0.70 10/23/2020   CALCIUM 8.7 (L) 10/23/2020   PROT 5.9 (L) 09/09/2020   ALBUMIN 2.7 (L) 09/09/2020   AST 78 (H) 09/09/2020   ALT 15 09/09/2020   ALKPHOS 64 09/09/2020   BILITOT 0.5 09/09/2020   GFRNONAA >60 10/23/2020   GFRAA  07/31/2008    >60        The eGFR has been calculated using the MDRD equation. This calculation has not been validated in all clinical situations. eGFR's persistently <60 mL/min signify possible Chronic Kidney Disease.    Lab Results  Component Value Date   CEA1 2.8 09/10/2020   CEA 3.26 06/03/2022    Medications: I have  reviewed the patient's current medications.   Assessment/Plan: Ascending colon cancer, stage IIa ([id number]), status post a right colectomy 10/21/2020 No lymphovascular or perineural invasion, grade 3-poorly differentiated, 0/24 lymph nodes, MSI-high, loss of MLH1 and PMS2 expression MLH1 hypermethylation present CT 08/29/2020-tiny peripheral lung nodules, ascending colon mass, aorta to iliac artery bypass graft Normal preoperative CEA Guardant Reveal 11/30/2020-ctDNA not detected Guardant Reveal 02/12/2021- ctDNA not detected Colonoscopy 02/21/2022-polyps removed from the rectum  Peripheral vascular disease CAD, NSTEMI 09/08/2020 Microcytic anemia secondary #1 Ongoing tobacco use Hyperlipidemia CT chest 08/28/2020--3 mm and less peripheral lung nodules CT chest 08/16/2021-new consolidation in the posterior left lower lobe and mild groundglass opacity in the anterior right middle lobe suspicious for pneumonia CT chest 09/30/2021-focal left lower lobe airspace disease decreased, but not completely resolved, resolution of right upper lobe groundglass opacities, stable pulmonary micronodules CT chest 11/11/2021-consolidation in the posterior left lower lobe appears scarlike and is smaller, scattered 1-2 mm subpleural micronodules in the upper lobes-unchanged       Disposition: Alejandra Ford is in clinical remission from colon cancer.  She will continue colonoscopy surveillance with Dr. Watt Climes.  She will return for an office visit and CEA in 6 months.  She will follow-up in the lung cancer screening clinic for surveillance of pulmonary nodules.  Betsy Coder, MD  06/03/2022  12:39 PM

## 2022-07-27 ENCOUNTER — Encounter: Payer: Self-pay | Admitting: Family Medicine

## 2022-08-24 ENCOUNTER — Ambulatory Visit (HOSPITAL_BASED_OUTPATIENT_CLINIC_OR_DEPARTMENT_OTHER): Payer: Medicare Other | Admitting: Cardiology

## 2022-10-20 ENCOUNTER — Encounter (HOSPITAL_BASED_OUTPATIENT_CLINIC_OR_DEPARTMENT_OTHER): Payer: Self-pay | Admitting: Cardiology

## 2022-10-20 ENCOUNTER — Ambulatory Visit (HOSPITAL_BASED_OUTPATIENT_CLINIC_OR_DEPARTMENT_OTHER): Payer: Medicare Other | Admitting: Cardiology

## 2022-10-20 VITALS — BP 110/80 | HR 67 | Ht 63.5 in | Wt 112.0 lb

## 2022-10-20 DIAGNOSIS — I2510 Atherosclerotic heart disease of native coronary artery without angina pectoris: Secondary | ICD-10-CM | POA: Diagnosis not present

## 2022-10-20 DIAGNOSIS — R002 Palpitations: Secondary | ICD-10-CM | POA: Diagnosis not present

## 2022-10-20 DIAGNOSIS — I252 Old myocardial infarction: Secondary | ICD-10-CM

## 2022-10-20 DIAGNOSIS — Z716 Tobacco abuse counseling: Secondary | ICD-10-CM

## 2022-10-20 DIAGNOSIS — F17210 Nicotine dependence, cigarettes, uncomplicated: Secondary | ICD-10-CM

## 2022-10-20 DIAGNOSIS — Z7189 Other specified counseling: Secondary | ICD-10-CM

## 2022-10-20 MED ORDER — NITROGLYCERIN 0.4 MG SL SUBL: 0.4000 mg | SUBLINGUAL_TABLET | SUBLINGUAL | 5 refills | Status: DC | PRN

## 2022-10-20 NOTE — Patient Instructions (Signed)
Medication Instructions:  Your physician recommends that you continue on your current medications as directed. Please refer to the Current Medication list given to you today.  *If you need a refill on your cardiac medications before your next appointment, please call your pharmacy*  Follow-Up: At [facility], you and your health needs are our priority.  As part of our continuing mission to provide you with exceptional heart care, we have created designated Provider Care Teams.  These Care Teams include your primary Cardiologist (physician) and Advanced Practice Providers (APPs -  Physician Assistants and Nurse Practitioners) who all work together to provide you with the care you need, when you need it.  We recommend signing up for the patient portal called "MyChart".  Sign up information is provided on this After Visit Summary.  MyChart is used to connect with patients for Virtual Visits (Telemedicine).  Patients are able to view lab/test results, encounter notes, upcoming appointments, etc.  Non-urgent messages can be sent to your provider as well.   To learn more about what you can do with MyChart, go to [email or web address].    Your next appointment:   6 month(s)  Provider:   Bridgette Christopher, MD    

## 2022-10-20 NOTE — Progress Notes (Signed)
Cardiology Office Note:  .    Date:  10/20/2022  ID:  STAYCE KOCA, DOB 1945/07/27, MRN [id number] PCP: Shon Hale, MD  [facility] Providers Cardiologist:  Jodelle Red, MD     History of Present Illness: .    Alejandra Ford is a 77 y.o. female with a hx of hyperlipidemia, PAD, and tobacco abuse, CAD with history of NSTEMI, who is seen for follow up. Her initial appointment 10/05/2020 had been scheduled as a new consult at the request of Shon Hale, * for the evaluation and management of palpitations, but in the interim she was admitted to the hospital and treated for NSTEMI.   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 She lost 92 lbs.    In 08/2021, she reported recurring palpitations in the mornings associated with "twinges." Moving out of bed slowly seemed to help. She was recovering from pneumonia (was ongoing for several months), and had been treated with antibiotics. Her palpitations did not correlate with the pneumonia. We discussed options for further evaluation with event monitor or [employer]; she opted for monitoring of her symptoms and would reach out if they worsen.   She was seen 01/06/22 by Sharlene Dory, NP via telemedicine for pre-op clearance regarding upcoming colonoscopy. She was doing well and denied palpitations.   At her visit 02/2022, she complained of ongoing periodic palpitations usually in the mornings. Only one time she almost needed to take a nitroglycerin. During this episode she felt a "twinge" type of pain. She was also using a nasal spray which had helped improve her lingering cough. After discussion, we planned to monitor her symptoms and she would contact us if her palpitations worsened.    Today, she complains of a mild head cold at this time. She reports her palpitations have been more quiescent. Every now and then she has "light" episodes of  palpitations when she wakes up that are very brief.   May have a twinge or two of chest pain every now and then. These episodes are not severe and do resolve very quickly, usually occurs in the setting of regular housework. She has never needed to use her sublingual nitroglycerin.  She continues to smoke, about 4 cigarettes a day. Her peak was about 9-10 cigarettes daily.  Compliant with 81 mg ASA. She does have easy bruising but denies any bleeding issues.  She denies any shortness of breath, peripheral edema, lightheadedness, headaches, syncope, orthopnea, or PND.  ROS:  Please see the history of present illness. ROS otherwise negative except as noted.  (+) Head congestion (+) Rare palpitations (+) Occasional chest pain/twinges (+) Easy bruising  Studies Reviewed: Marland Kitchen    EKG Interpretation Date/Time:  Thursday October 20 2022 15:51:47 EDT Ventricular Rate:  67 PR Interval:  150 QRS Duration:  78 QT Interval:  374 QTC Calculation: 395 R Axis:   79  Text Interpretation: Normal sinus rhythm Normal ECG Confirmed by Jodelle Red [phone]) on 10/20/2022 4:15:49 PM     Physical Exam:    VS:  BP 110/80   Pulse 67   Ht 5' 3.5" (1.613 m)   Wt 112 lb (50.8 kg)   SpO2 96%   BMI 19.53 kg/m    Wt Readings from Last 3 Encounters:  10/20/22 112 lb (50.8 kg)  06/03/22 109 lb 12.8 oz (49.8 kg)  02/15/22 107 lb 12.8 oz (48.9 kg)    GEN:  Well nourished, well developed in no acute distress HEENT: Normal, moist mucous membranes NECK: No JVD CARDIAC: regular rhythm, normal S1 and S2, no rubs or gallops. No murmur. VASCULAR: Radial and DP pulses 2+ bilaterally. No carotid bruits RESPIRATORY:  Rhonchi bilaterally, no rales, wheezing  ABDOMEN: Soft, non-tender, non-distended MUSCULOSKELETAL:  Ambulates independently SKIN: Warm and dry, no edema NEUROLOGIC:  Alert and oriented x 3. No focal neuro deficits noted. PSYCHIATRIC:  Normal affect   ASSESSMENT AND PLAN: .     Palpitations -improved -for now, she will monitor symptoms and contact me if things worsen -reviewed red flag warning signs that need immediate medical attention   Coronary disease, without angina History of NSTEMI History of PAD Pure hypercholesterolemia, LDL goal <70 -chest pain free, reviewed her anginal equivalent -echo, cath results as above -tolerating aspirin, atorvastatin, metoprolol -last LDL 45   Tobacco abuse counseling: The patient was counseled on tobacco cessation today for 3 minutes.  Counseling included reviewing the risks of smoking tobacco products, how it impacts the patient's current medical diagnoses and different strategies for quitting.  Pharmacotherapy to aid in tobacco cessation was not prescribed today.    Cardiac risk counseling and prevention recommendations: -recommend heart healthy/Mediterranean diet, with whole grains, fruits, vegetable, fish, lean meats, nuts, and olive oil. Limit salt. -recommend moderate walking, 3-5 times/week for 30-50 minutes each session. Aim for at least 150 minutes.week. Goal should be pace of 3 miles/hours, or walking 1.5 miles in 30 minutes -recommend avoidance of tobacco products. Avoid excess alcohol.  Dispo: Follow-up in 6 months, or sooner as needed.  I,Mathew Stumpf,acting as a Neurosurgeon for [employer], MD.,have documented all relevant documentation on the behalf of Jodelle Red, MD,as directed by  Jodelle Red, MD while in the presence of Jodelle Red, MD.  I, Jodelle Red, MD, have reviewed all documentation for this visit. The documentation on 10/20/22 for the exam, diagnosis, procedures, and orders are all accurate and complete.   Signed, Jodelle Red, MD

## 2022-12-02 ENCOUNTER — Inpatient Hospital Stay: Payer: Medicare Other | Admitting: Oncology

## 2022-12-02 ENCOUNTER — Inpatient Hospital Stay: Payer: Medicare Other | Attending: Oncology

## 2022-12-02 VITALS — BP 105/60 | HR 75 | Temp 98.1°F | Resp 18 | Ht 63.0 in | Wt 112.0 lb

## 2022-12-02 DIAGNOSIS — R109 Unspecified abdominal pain: Secondary | ICD-10-CM | POA: Insufficient documentation

## 2022-12-02 DIAGNOSIS — E785 Hyperlipidemia, unspecified: Secondary | ICD-10-CM | POA: Diagnosis not present

## 2022-12-02 DIAGNOSIS — I214 Non-ST elevation (NSTEMI) myocardial infarction: Secondary | ICD-10-CM | POA: Insufficient documentation

## 2022-12-02 DIAGNOSIS — D509 Iron deficiency anemia, unspecified: Secondary | ICD-10-CM | POA: Insufficient documentation

## 2022-12-02 DIAGNOSIS — C182 Malignant neoplasm of ascending colon: Secondary | ICD-10-CM

## 2022-12-02 DIAGNOSIS — R918 Other nonspecific abnormal finding of lung field: Secondary | ICD-10-CM | POA: Insufficient documentation

## 2022-12-02 DIAGNOSIS — I2510 Atherosclerotic heart disease of native coronary artery without angina pectoris: Secondary | ICD-10-CM | POA: Diagnosis not present

## 2022-12-02 DIAGNOSIS — I739 Peripheral vascular disease, unspecified: Secondary | ICD-10-CM | POA: Insufficient documentation

## 2022-12-02 DIAGNOSIS — Z85038 Personal history of other malignant neoplasm of large intestine: Secondary | ICD-10-CM | POA: Insufficient documentation

## 2022-12-02 LAB — CEA (ACCESS): CEA (CHCC): 3.1 ng/mL (ref 0.00–5.00)

## 2022-12-02 NOTE — Progress Notes (Signed)
  [facility] OFFICE PROGRESS NOTE   Diagnosis: Colon cancer  INTERVAL HISTORY:   Ms. Daw returns as scheduled.  She feels well.  Good appetite and energy level.  No difficulty with bowel function.  No bleeding.  Developed abdominal "bloating "when she started Fosamax  Objective:  Vital signs in last 24 hours:  Blood pressure 105/60, pulse 75, temperature 98.1 F (36.7 C), temperature source Tympanic, resp. rate 18, height 5\' 3"  (1.6 m), weight 112 lb (50.8 kg), SpO2 98%.    Lymphatics: No cervical, supraclavicular, axillary, or inguinal nodes Resp: Bilateral wheeze, no respiratory distress Cardio: Regular rate and rhythm GI: No hepatosplenomegaly, no mass, nontender, no apparent ascites Vascular: No leg edema, left lower leg is larger than the right side   Lab Results:  Lab Results  Component Value Date   WBC 12.9 (H) 10/23/2020   HGB 7.6 (L) 10/23/2020   HCT 24.5 (L) 10/23/2020   MCV 79.5 (L) 10/23/2020   PLT 427 (H) 10/23/2020   NEUTROABS 5.8 05/23/2007    CMP  Lab Results  Component Value Date   NA 135 10/23/2020   K 3.9 10/23/2020   CL 103 10/23/2020   CO2 27 10/23/2020   GLUCOSE 91 10/23/2020   BUN 9 10/23/2020   CREATININE 0.70 10/23/2020   CALCIUM 8.7 (L) 10/23/2020   PROT 5.9 (L) 09/09/2020   ALBUMIN 2.7 (L) 09/09/2020   AST 78 (H) 09/09/2020   ALT 15 09/09/2020   ALKPHOS 64 09/09/2020   BILITOT 0.5 09/09/2020   GFRNONAA >60 10/23/2020   GFRAA  07/31/2008    >60        The eGFR has been calculated using the MDRD equation. This calculation has not been validated in all clinical situations. eGFR's persistently <60 mL/min signify possible Chronic Kidney Disease.    Lab Results  Component Value Date   CEA1 2.8 09/10/2020   CEA 3.10 12/02/2022     Medications: I have reviewed the patient's current medications.   Assessment/Plan: Ascending colon cancer, stage IIa ([id number]), status post a right colectomy 10/21/2020 No  lymphovascular or perineural invasion, grade 3-poorly differentiated, 0/24 lymph nodes, MSI-high, loss of MLH1 and PMS2 expression MLH1 hypermethylation present CT 08/29/2020-tiny peripheral lung nodules, ascending colon mass, aorta to iliac artery bypass graft Normal preoperative CEA Guardant Reveal 11/30/2020-ctDNA not detected Guardant Reveal 02/12/2021- ctDNA not detected Colonoscopy 02/21/2022-polyps removed from the rectum  Peripheral vascular disease CAD, NSTEMI 09/08/2020 Microcytic anemia secondary #1 Ongoing tobacco use Hyperlipidemia CT chest 08/28/2020--3 mm and less peripheral lung nodules CT chest 08/16/2021-new consolidation in the posterior left lower lobe and mild groundglass opacity in the anterior right middle lobe suspicious for pneumonia CT chest 09/30/2021-focal left lower lobe airspace disease decreased, but not completely resolved, resolution of right upper lobe groundglass opacities, stable pulmonary micronodules CT chest 11/11/2021-consolidation in the posterior left lower lobe appears scarlike and is smaller, scattered 1-2 mm subpleural micronodules in the upper lobes-unchanged    Disposition: Ms. Donaway is in clinical remission from colon cancer.  She will return for an office visit and CEA in 6 months.  She will continue colonoscopy surveillance with Dr. Ewing Schlein.  Thornton Papas, MD  12/02/2022  12:24 PM

## 2022-12-12 ENCOUNTER — Telehealth: Payer: Self-pay | Admitting: Acute Care

## 2022-12-12 DIAGNOSIS — R911 Solitary pulmonary nodule: Secondary | ICD-10-CM

## 2022-12-12 NOTE — Telephone Encounter (Signed)
Called and left VM for pt.    Alejandra Ngo, NP  Ladene Artist, MD; P [facility] Lung Nodule [facility], can we get this patient scheduled for December? She wanted to check with her PCP to make sure she thought annual scan was ok, but Dr. Truett Perna would like her to have LDT if she qualifies, and an annual scan for surveillance if she does not. If she does not qualify, we can do a CT Chest without contrast.  Thanks so much       Previous Messages    ----- Message ----- From: Ladene Artist, MD Sent: 12/02/2022  12:37 PM EDT To: Alejandra Ngo, NP  Tobacco use, lung nodules, you saw her in Dec 2023, not scheduled for f/u but your note said she would get lung cancer screening in Dec 2024, will she qualify?  Colon cancer 2022  Thanks,  [name]

## 2022-12-19 NOTE — Telephone Encounter (Signed)
Called and spoke to pt. Asking screening questions. Patient is a current smoker started at age 77 (66). Patient has quit several times since her start date. She states she has three children and quit while pregnant and while they were infants, saying she quit at least 1.5 years with each pregnancy/child; equaling 4.5 years quit. Patient states she quit two other times totaling at about 4-5 years. Patients age 45-22 years of stating age-49 years quit = 46 years of smoking. Patient states she has never smoked at 1/2 pack per day. Patient smoked around 1/4 for about 35 years and smoked just under 1/2 pack for about 10 years, this would average out to about 14 pack year smoking history. Advised pt she does not qualify for LCS but we can process with reg CT chest. Patient is in agreement and is aware she will be contacted to schedule CT and OV.

## 2023-02-08 ENCOUNTER — Ambulatory Visit
Admission: RE | Admit: 2023-02-08 | Discharge: 2023-02-08 | Disposition: A | Discharge status: Home / Self Care | Payer: Medicare Other | Source: Ambulatory Visit | Attending: Acute Care | Admitting: Acute Care

## 2023-02-08 ENCOUNTER — Other Ambulatory Visit: Payer: Medicare Other

## 2023-02-08 DIAGNOSIS — R911 Solitary pulmonary nodule: Secondary | ICD-10-CM

## 2023-02-24 ENCOUNTER — Ambulatory Visit: Payer: Medicare Other | Admitting: Acute Care

## 2023-03-06 NOTE — Progress Notes (Signed)
 History of Present Illness Alejandra Ford is a 77 y.o. female current some day smoker seen by Dr. Meade 12/16/2021 for a left lung mass found on a CT Chest. She was referred by her PCP Dr. Lamarr Rotunda .   Additional past medical history of colon cancer s/p colectomy (no chemo/rads) in 2022 and CAD s/p NSTEMI.  Of note, she had pneumonia July 2023, which was treated with outpatient abx.Patient  had a lingering cough and had subsequent imaging which showed LLL opacity.  She notes that as the weather gets colder her cough is worsening and she had similar symptoms last year. Denies shortness of breath. This is her first time having pneumonia, denies recurrent bronchitis  She is not on any maintenance inhalers.    Social history:   Occupation: office work.  Exposures: lives at home with husband. No pets Smoking history: 0.25 ppd today - Start smoking 21 x 1/2 ppd, quit for three years while pregnant (gradually cut down, cold turkey) 25 pack year smoking history.     03/07/2023 Pt. Presents for 2 month CT Chest follow up after seeing Dr. Meade 12/2021. She had  CT Chest Sept and July 2023 images revealed resolving LLL cavitary lesion most consistent with resolving pneumonia . She was seen by me 02/2022. CT chest at that time showed showed further resolution of previously noted groundglass density in the left lower lobe.  There was also some notation of focal bronchiectasis associated in that posterior left lung base that was most likely consistent with postinflammatory or infectious scarring.  As follow up and CT Chest was scheduled for 02/2023 to ensure resolution of cavitary lesion noted on her 02/15/2022 scan. Patient is here today to review the most recent scan. Patient states she has been doing well, no issues with her respiratory system, no issues with breathing. We have reviewed the results of the CT chest done February 08, 2023.  The scan shows no acute process or evidence of  metastatic disease in the chest.  She does have similar left lower lobe scarring secondary to a bad pneumonia that she had in June 2023.  There is also notation of aortic atherosclerosis, coronary artery atherosclerosis and emphysema. Patient states she is followed by cardiology.  She is reassured that her chest x-ray is stable. We will plan a 12 month follow up to ensure stability.   Test Results: CT Chest 02/08/2023 Lungs/Pleura: No pleural fluid. Mild centrilobular emphysema. Smoking related respiratory bronchiolitis.   Ongoing stability of millimetric pulmonary nodules, consistent with a benign etiology. Example left upper lobe 2-3 mm nodule on 65/5.   Similar left lower lobe posterior scar as evidenced by a area of volume loss and mild soft tissue thickening about the posterior aspect of a dilated bronchiolel. Example 127/5.   Upper Abdomen: Normal imaged portions of the liver, spleen, stomach, pancreas, gallbladder, adrenal glands, kidneys.   Musculoskeletal: No acute osseous abnormality.   IMPRESSION: 1.  No acute process or evidence of metastatic disease in the chest. 2. Similar left lower lobe scarring. 3. Aortic atherosclerosis (ICD10-I70.0), coronary artery atherosclerosis and emphysema (ICD10-J43.9).  02/10/2022 CT Chest   Further resolution of previously noted ground-glass density in the left lower lobe. Residual focal bronchiectasis and associated somewhat linear density in the posterior left lung base, felt consistent with post inflammatory/infectious scarring. Multiple punctate 1-2 mm pulmonary nodules are grossly unchanged. No specific imaging follow-up is recommended. Aortic atherosclerosis.   11/11/2021 CT Chest Area of posterior basal left lower  lobe consolidation and pleurodiaphragmatic adhesion with bronchiectasis, appears more scar-like today and is smaller in size, but there is persistent surrounding ground-glass opacity. This is less dense than  previously but persistent which could indicate unresolved pneumonitis or ground-glass scarring. Three-month follow-up CT recommended for recharacterization. Scattered mucoid debris in the trachea. Stable subpleural micronodules in the upper lobes, likely due to small airways disease. Emphysema. Aortic and coronary artery atherosclerosis with heavily calcified superior mitral ring.   CT Chest 09/30/2021 Focal left lower lobe airspace disease has decreased, but has not completely resolved. Follow-up CT recommended in 4-6 weeks to confirm resolution.   Right upper lobe ground-glass opacities have completely resolved.   Minimal secretions in the trachea and right mainstem bronchus.   Stable pulmonary micro nodules. No follow-up needed if patient is low-risk (and has no known or suspected primary neoplasm). Non-contrast chest CT can be considered in 12 months if patient is high-risk. This recommendation follows the consensus statement: Guidelines for Management of Incidental Pulmonary Nodules Detected on CT Images:   CT Chest 08/16/2021 A few scattered tiny less than 5 mm pulmonary nodules are again seen bilaterally which remain stable. No suspicious nodules or masses identified. New pulmonary consolidation with central air bronchograms and small area of cavitation is seen in the posterior left lower lobe, highly suspicious for pneumonia. New mild ground-glass opacity is also seen in the anterior right middle lobe. No evidence of pleural effusion.     Latest Ref Rng & Units 10/23/2020    4:23 AM 10/22/2020    5:22 AM 10/13/2020   11:58 AM  CBC  WBC 4.0 - 10.5 K/uL 12.9  13.3  10.0   Hemoglobin 12.0 - 15.0 g/dL 7.6  7.2  9.2   Hematocrit 36.0 - 46.0 % 24.5  23.0  29.2   Platelets 150 - 400 K/uL 427  396  547        Latest Ref Rng & Units 10/23/2020    4:23 AM 10/22/2020    5:22 AM 10/13/2020   11:58 AM  BMP  Glucose 70 - 99 mg/dL 91  842  894   BUN 8 - 23 mg/dL 9  6  16     Creatinine 0.44 - 1.00 mg/dL 9.29  9.31  9.18   Sodium 135 - 145 mmol/L 135  134  133   Potassium 3.5 - 5.1 mmol/L 3.9  3.6  4.1   Chloride 98 - 111 mmol/L 103  102  95   CO2 22 - 32 mmol/L 27  25  26    Calcium  8.9 - 10.3 mg/dL 8.7  8.7  9.0     BNP No results found for: BNP  ProBNP No results found for: PROBNP  PFT No results found for: FEV1PRE, FEV1POST, FVCPRE, FVCPOST, TLC, DLCOUNC, PREFEV1FVCRT, PSTFEV1FVCRT  CT Chest Wo Contrast Result Date: 02/17/2023 CLINICAL DATA:  Follow-up of pulmonary nodule. History of colon cancer. Current smoker. * Tracking Code: BO * EXAM: CT CHEST WITHOUT CONTRAST TECHNIQUE: Multidetector CT imaging of the chest was performed following the standard protocol without IV contrast. RADIATION DOSE REDUCTION: This exam was performed according to the departmental dose-optimization program which includes automated exposure control, adjustment of the mA and/or kV according to patient size and/or use of iterative reconstruction technique. COMPARISON:  02/09/2022 FINDINGS: Cardiovascular: Aortic atherosclerosis. Tortuous thoracic aorta. Normal heart size, without pericardial effusion. Three vessel coronary artery calcification. Aortic valve calcification. Mediastinum/Nodes: No mediastinal or hilar adenopathy, given limitations of unenhanced CT. Lungs/Pleura: No pleural fluid. Mild  centrilobular emphysema. Smoking related respiratory bronchiolitis. Ongoing stability of millimetric pulmonary nodules, consistent with a benign etiology. Example left upper lobe 2-3 mm nodule on 65/5. Similar left lower lobe posterior scar as evidenced by a area of volume loss and mild soft tissue thickening about the posterior aspect of a dilated bronchiolel. Example 127/5. Upper Abdomen: Normal imaged portions of the liver, spleen, stomach, pancreas, gallbladder, adrenal glands, kidneys. Musculoskeletal: No acute osseous abnormality. IMPRESSION: 1.  No acute process or  evidence of metastatic disease in the chest. 2. Similar left lower lobe scarring. 3. Aortic atherosclerosis (ICD10-I70.0), coronary artery atherosclerosis and emphysema (ICD10-J43.9). Electronically Signed   By: Rockey Kilts M.D.   On: 02/17/2023 11:10     Past medical hx Past Medical History:  Diagnosis Date   Arthritis    Cancer (HCC)    colon   Dysrhythmia    HLD (hyperlipidemia)    Myocardial infarction (HCC)    PAD (peripheral artery disease) (HCC)    Pneumonia    Tobacco abuse      Social History   Tobacco Use   Smoking status: Some Days    Current packs/day: 0.50    Average packs/day: 0.5 packs/day for 50.0 years (25.0 ttl pk-yrs)    Types: Cigarettes   Smokeless tobacco: Former   Tobacco comments:    Smokes 3-5 cigarettes/day.  Trying to quit.  02/2023  Vaping Use   Vaping status: Never Used  Substance Use Topics   Alcohol use: Never   Drug use: Never    Ms.Diegel reports that she has been smoking cigarettes. She has a 25 pack-year smoking history. She has quit using smokeless tobacco. She reports that she does not drink alcohol and does not use drugs.  Tobacco Cessation: Current every day smoker. Counseled on smoking cessation for 3-4 minutes at today's OV.   Past surgical hx, Family hx, Social hx all reviewed.  Current Outpatient Medications on File Prior to Visit  Medication Sig   acetaminophen  (TYLENOL ) 500 MG tablet Take 500 mg by mouth every 6 (six) hours as needed for moderate pain.   aspirin  EC 81 MG tablet Take 81 mg by mouth daily. Swallow whole.   atorvastatin  (LIPITOR ) 80 MG tablet Take 1 tablet (80 mg total) by mouth every evening.   betamethasone dipropionate 0.05 % cream Apply 1 application topically daily as needed (eczema on feet).   Cholecalciferol  (VITAMIN D ) 50 MCG (2000 UT) CAPS Take 2,000 Units by mouth at bedtime.   fluticasone  (FLONASE ) 50 MCG/ACT nasal spray Place 1 spray into both nostrils daily.   ibuprofen (ADVIL) 200 MG tablet  Take 200 mg by mouth every 6 (six) hours as needed for moderate pain.   metoprolol  tartrate (LOPRESSOR ) 25 MG tablet TAKE 1/2 TABLET(12.5 MG) BY MOUTH TWICE DAILY   nitroGLYCERIN  (NITROSTAT ) 0.4 MG SL tablet Place 1 tablet (0.4 mg total) under the tongue every 5 (five) minutes x 3 doses as needed for chest pain. (Patient not taking: Reported on 12/02/2022)   No current facility-administered medications on file prior to visit.     Allergies  Allergen Reactions   Oxycodone Nausea And Vomiting    Hallucinations/nightmares Hallucinations/nightmares    Trazodone  Anxiety    Nightmares    Review Of Systems:  Constitutional:   No  weight loss, night sweats,  Fevers, chills, fatigue, or  lassitude.  HEENT:   No headaches,  Difficulty swallowing,  Tooth/dental problems, or  Sore throat,  No sneezing, itching, ear ache, nasal congestion, post nasal drip,   CV:  No chest pain,  Orthopnea, PND, swelling in lower extremities, anasarca, dizziness, palpitations, syncope.   GI  No heartburn, indigestion, abdominal pain, nausea, vomiting, diarrhea, change in bowel habits, loss of appetite, bloody stools.   Resp: No shortness of breath with exertion or at rest.  No excess mucus, no productive cough,  No non-productive cough,  No coughing up of blood.  No change in color of mucus.  No wheezing.  No chest wall deformity  Skin: no rash or lesions.  GU: no dysuria, change in color of urine, no urgency or frequency.  No flank pain, no hematuria   MS:  No joint pain or swelling.  No decreased range of motion.  No back pain.  Psych:  No change in mood or affect. No depression or anxiety.  No memory loss.   Vital Signs BP 122/72 (BP Location: Left Arm, Patient Position: Sitting, Cuff Size: Normal)   Pulse 77   Temp 98.2 F (36.8 C) (Oral)   Ht 5' 3 (1.6 m)   Wt 110 lb (49.9 kg)   SpO2 97%   BMI 19.49 kg/m    Physical Exam:  General- No distress,  A&Ox3, pleasant and  appropriate ENT: No sinus tenderness, TM clear, pale nasal mucosa, no oral exudate,no post nasal drip, no LAN Cardiac: S1, S2, regular rate and rhythm, no murmur Chest: No wheeze/ rales/ dullness; no accessory muscle use, no nasal flaring, no sternal retractions Abd.: Soft Non-tender, nondistended, nontender, bowel sounds positive, Body mass index is 19.49 kg/m.  Ext: No clubbing cyanosis, edema Neuro:  normal strength, moving all extremities comes for, alert and oriented x 3, appropriate Skin: No rashes, warm and dry, no obvious lesions Psych: normal mood and behavior   Assessment/Plan  Assessment:  Abnormal CT Chest, likely resolving pneumonia Stable lung scarring most likely related to previous pneumonia Tobacco use disorder.  History of colon cancer Plan Your scan shows no evidence of metastatic disease in the chest., similar left lower lobe scarring. This is good news. We will continue to follow with annual chest scan for another year to confirm stability.  This will be due 02/2024. You will get a call to schedule this closer to the time. We will see you in the office to review the results after the scan. Call if you have any breathing issues if you need to sooner. Please contact office for sooner follow up if symptoms do not improve or worsen or seek emergency care   Please work on quitting smoking. You can receive free nicotine replacement therapy (patches, gum, or mints) by calling 1-800-QUIT NOW. Please call so we can get you on the path to becoming a non-smoker. I know it is hard, but you can do this!  Hypnosis for smoking cessation  [employer] [phone]  Acupuncture for smoking cessation  [employer] [phone]    I spent 30 minutes dedicated to the care of this patient on the date of this encounter to include pre-visit review of records, face-to-face time with the patient discussing conditions above, post visit ordering of testing,  clinical documentation with the electronic health record, making appropriate referrals as documented, and communicating necessary information to the patient's healthcare team.        Lauraine JULIANNA Lites, NP 03/07/2023  1:21 PM

## 2023-03-07 ENCOUNTER — Ambulatory Visit: Payer: Medicare Other | Admitting: Acute Care

## 2023-03-07 ENCOUNTER — Encounter: Payer: Self-pay | Admitting: Acute Care

## 2023-03-07 VITALS — BP 122/72 | HR 77 | Temp 98.2°F | Ht 63.0 in | Wt 110.0 lb

## 2023-03-07 DIAGNOSIS — F17210 Nicotine dependence, cigarettes, uncomplicated: Secondary | ICD-10-CM

## 2023-03-07 DIAGNOSIS — Z8701 Personal history of pneumonia (recurrent): Secondary | ICD-10-CM

## 2023-03-07 DIAGNOSIS — J984 Other disorders of lung: Secondary | ICD-10-CM | POA: Diagnosis not present

## 2023-03-07 DIAGNOSIS — Z85038 Personal history of other malignant neoplasm of large intestine: Secondary | ICD-10-CM

## 2023-03-07 DIAGNOSIS — F17200 Nicotine dependence, unspecified, uncomplicated: Secondary | ICD-10-CM

## 2023-03-07 NOTE — Patient Instructions (Addendum)
 It is good to see you today. Your scan shows no evidence of metastatic disease in the chest., similar left lower lobe scarring. This is good news. We will continue to follow with annual chest scan for another year to confirm stability.  This will be due 02/2024. You will get a call to schedule this closer to the time. We will see you in the office to review the results after the scan. Call if you have any breathing issues if you need to sooner. Please contact office for sooner follow up if symptoms do not improve or worsen or seek emergency care   Please work on quitting smoking. You can receive free nicotine replacement therapy (patches, gum, or mints) by calling 1-800-QUIT NOW. Please call so we can get you on the path to becoming a non-smoker. I know it is hard, but you can do this!  Hypnosis for smoking cessation  [employer] [phone]  Acupuncture for smoking cessation  [employer] [phone]

## 2023-03-23 ENCOUNTER — Other Ambulatory Visit (HOSPITAL_BASED_OUTPATIENT_CLINIC_OR_DEPARTMENT_OTHER): Payer: Self-pay

## 2023-03-23 ENCOUNTER — Encounter (HOSPITAL_BASED_OUTPATIENT_CLINIC_OR_DEPARTMENT_OTHER): Payer: Self-pay

## 2023-03-23 DIAGNOSIS — I2510 Atherosclerotic heart disease of native coronary artery without angina pectoris: Secondary | ICD-10-CM

## 2023-03-23 MED ORDER — NITROGLYCERIN 0.4 MG SL SUBL: 0.4000 mg | SUBLINGUAL_TABLET | SUBLINGUAL | 5 refills | Status: DC | PRN

## 2023-03-23 NOTE — Progress Notes (Signed)
Med refill sent to local pharmacy.

## 2023-06-01 ENCOUNTER — Inpatient Hospital Stay: Payer: Self-pay

## 2023-06-01 ENCOUNTER — Inpatient Hospital Stay: Payer: Medicare Other

## 2023-06-01 ENCOUNTER — Telehealth: Payer: Self-pay | Admitting: Oncology

## 2023-06-01 ENCOUNTER — Encounter: Payer: Self-pay | Admitting: Oncology

## 2023-06-01 ENCOUNTER — Inpatient Hospital Stay: Payer: Medicare Other | Attending: Oncology | Admitting: Oncology

## 2023-06-01 VITALS — BP 99/58 | HR 69 | Temp 98.2°F | Resp 18 | Ht 63.0 in | Wt 109.6 lb

## 2023-06-01 DIAGNOSIS — C182 Malignant neoplasm of ascending colon: Secondary | ICD-10-CM | POA: Diagnosis not present

## 2023-06-01 DIAGNOSIS — F17210 Nicotine dependence, cigarettes, uncomplicated: Secondary | ICD-10-CM | POA: Diagnosis not present

## 2023-06-01 DIAGNOSIS — D509 Iron deficiency anemia, unspecified: Secondary | ICD-10-CM | POA: Diagnosis not present

## 2023-06-01 DIAGNOSIS — E785 Hyperlipidemia, unspecified: Secondary | ICD-10-CM | POA: Diagnosis not present

## 2023-06-01 DIAGNOSIS — Z85038 Personal history of other malignant neoplasm of large intestine: Secondary | ICD-10-CM | POA: Insufficient documentation

## 2023-06-01 DIAGNOSIS — I252 Old myocardial infarction: Secondary | ICD-10-CM | POA: Diagnosis not present

## 2023-06-01 DIAGNOSIS — I2510 Atherosclerotic heart disease of native coronary artery without angina pectoris: Secondary | ICD-10-CM | POA: Insufficient documentation

## 2023-06-01 DIAGNOSIS — I739 Peripheral vascular disease, unspecified: Secondary | ICD-10-CM | POA: Insufficient documentation

## 2023-06-01 DIAGNOSIS — M25569 Pain in unspecified knee: Secondary | ICD-10-CM | POA: Insufficient documentation

## 2023-06-01 DIAGNOSIS — R918 Other nonspecific abnormal finding of lung field: Secondary | ICD-10-CM | POA: Insufficient documentation

## 2023-06-01 LAB — CEA (ACCESS): CEA (CHCC): 2.91 ng/mL (ref 0.00–5.00)

## 2023-06-01 NOTE — Progress Notes (Signed)
  [facility] OFFICE PROGRESS NOTE   Diagnosis: Colon cancer  INTERVAL HISTORY:   Alejandra Ford returns as scheduled.  She feels well.  Good appetite.  No difficulty with bowel function.  No bleeding.  She has knee pain.  No new pain.  She smokes 3 to 4 cigarettes/day.  She has been evaluated in the lung cancer screening clinic.  Objective:  Vital signs in last 24 hours:  Blood pressure (!) 99/58, pulse 69, temperature 98.2 F (36.8 C), resp. rate 18, height 5\' 3"  (1.6 m), weight 109 lb 9.6 oz (49.7 kg), SpO2 100%.   Lymphatics: No cervical, supraclavicular, axillary, or inguinal nodes Resp: Lungs clear bilaterally Cardio: Regular rate and rhythm GI: No hepatosplenomegaly, nontender, no mass, palpable transverse vascular graft at the lower abdomen Vascular: No leg edema  Lab Results:  Lab Results  Component Value Date   WBC 12.9 (H) 10/23/2020   HGB 7.6 (L) 10/23/2020   HCT 24.5 (L) 10/23/2020   MCV 79.5 (L) 10/23/2020   PLT 427 (H) 10/23/2020   NEUTROABS 5.8 05/23/2007    CMP  Lab Results  Component Value Date   NA 135 10/23/2020   K 3.9 10/23/2020   CL 103 10/23/2020   CO2 27 10/23/2020   GLUCOSE 91 10/23/2020   BUN 9 10/23/2020   CREATININE 0.70 10/23/2020   CALCIUM 8.7 (L) 10/23/2020   PROT 5.9 (L) 09/09/2020   ALBUMIN 2.7 (L) 09/09/2020   AST 78 (H) 09/09/2020   ALT 15 09/09/2020   ALKPHOS 64 09/09/2020   BILITOT 0.5 09/09/2020   GFRNONAA >60 10/23/2020   GFRAA  07/31/2008    >60        The eGFR has been calculated using the MDRD equation. This calculation has not been validated in all clinical situations. eGFR's persistently <60 mL/min signify possible Chronic Kidney Disease.    Lab Results  Component Value Date   CEA1 2.8 09/10/2020   CEA 2.91 06/01/2023    Medications: I have reviewed the patient's current medications.   Assessment/Plan: Ascending colon cancer, stage IIa (pT3pN0cM0), status post a right colectomy  10/21/2020 No lymphovascular or perineural invasion, grade 3-poorly differentiated, 0/24 lymph nodes, MSI-high, loss of MLH1 and PMS2 expression MLH1 hypermethylation present CT 08/29/2020-tiny peripheral lung nodules, ascending colon mass, aorta to iliac artery bypass graft Normal preoperative CEA Guardant Reveal 11/30/2020-ctDNA not detected Guardant Reveal 02/12/2021- ctDNA not detected Colonoscopy 02/21/2022-polyps removed from the rectum  Peripheral vascular disease CAD, NSTEMI 09/08/2020 Microcytic anemia secondary #1 Ongoing tobacco use Hyperlipidemia CT chest 08/28/2020--3 mm and less peripheral lung nodules CT chest 08/16/2021-new consolidation in the posterior left lower lobe and mild groundglass opacity in the anterior right middle lobe suspicious for pneumonia CT chest 09/30/2021-focal left lower lobe airspace disease decreased, but not completely resolved, resolution of right upper lobe groundglass opacities, stable pulmonary micronodules CT chest 11/11/2021-consolidation in the posterior left lower lobe appears scarlike and is smaller, scattered 1-2 mm subpleural micronodules in the upper lobes-unchanged CT chest 02/08/2023-stable left lower lobe scar, no evidence of metastatic disease     Disposition: [name] is in clinical remission from colon cancer.  She will return for an office visit and CEA in 6 months.  She will continue colonoscopy surveillance with Dr. Ewing Schlein. She will follow-up with pulmonary medicine with a chest CT in December of this year.  Thornton Papas, MD  06/01/2023  10:43 AM

## 2023-06-01 NOTE — Telephone Encounter (Signed)
 Left patient a vm regarding upcoming appointment

## 2023-08-01 ENCOUNTER — Other Ambulatory Visit: Payer: Self-pay | Admitting: Cardiology

## 2023-08-10 ENCOUNTER — Other Ambulatory Visit: Payer: Self-pay | Admitting: Family Medicine

## 2023-08-10 DIAGNOSIS — Z1231 Encounter for screening mammogram for malignant neoplasm of breast: Secondary | ICD-10-CM

## 2023-09-04 IMAGING — CT CT CHEST W/O CM
2 of 4 series · 15 of 36 positions shown, 18 images · non-contrast
Comparison: 08/28/2020

CLINICAL DATA: Follow-up indeterminate pulmonary nodules. Smoker.
Colon carcinoma.

* Tracking Code: BO *
EXAM:
CT CHEST WITHOUT CONTRAST
TECHNIQUE: Multidetector CT imaging of the chest was performed following the
standard protocol without IV contrast.
RADIATION DOSE REDUCTION: This exam was performed according to the
departmental dose-optimization program which includes automated
exposure control, adjustment of the mA and/or kV according to
patient size and/or use of iterative reconstruction technique.

[Series 2: routine chest without · axial · non-contrast · 0.59mm/px · z∈[+1200,+1484]mm · 12 of 166 slices shown, 15 images]
[im 12/166  mediastinal]
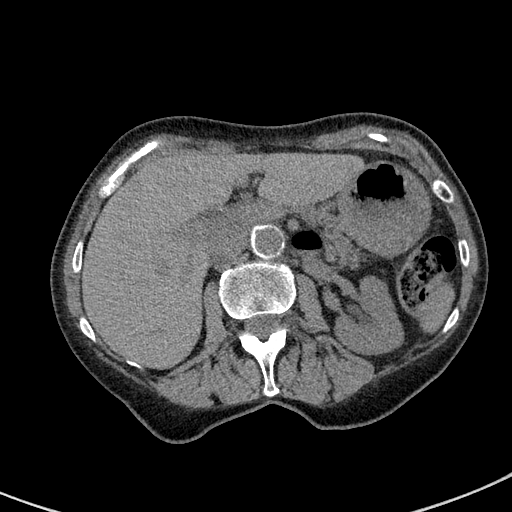
[im 12/166  lung]
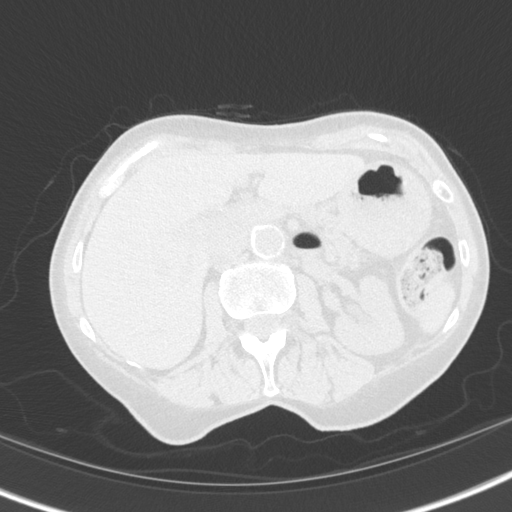
[im 24/166  lung]
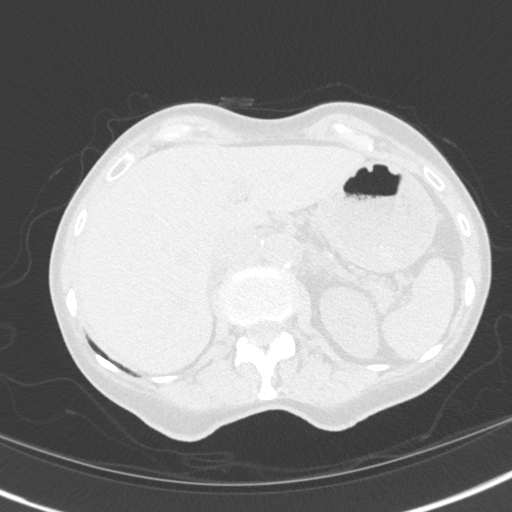
[im 36/166  lung]
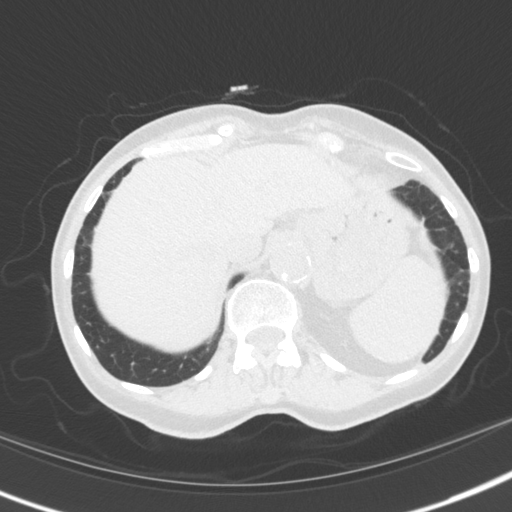
[im 48/166  lung]
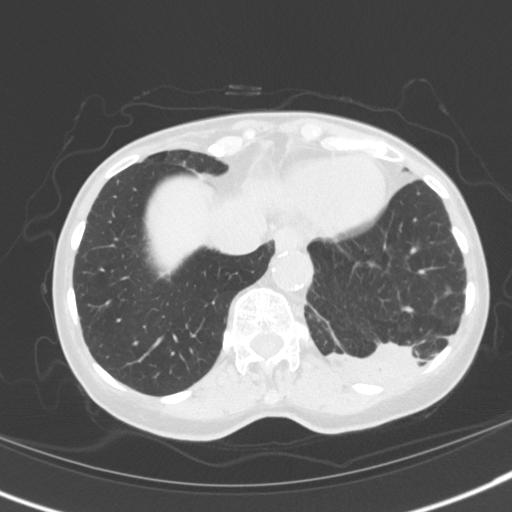
[im 59/166  mediastinal]
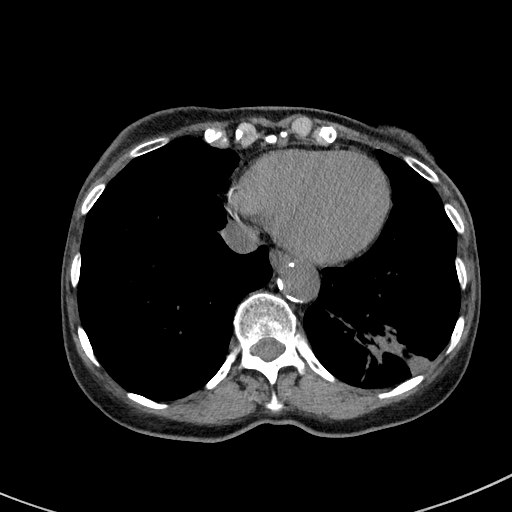
[im 59/166  lung]
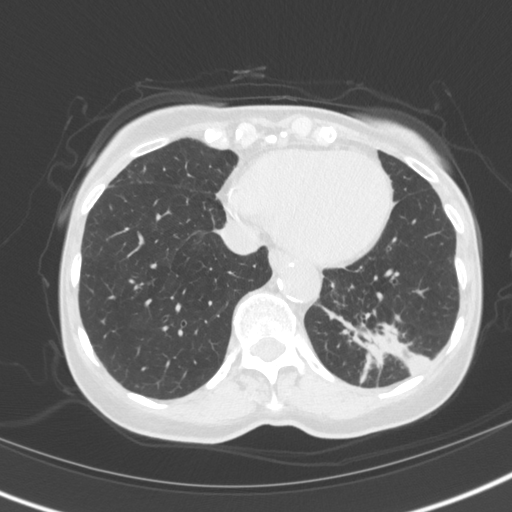
[im 71/166  lung]
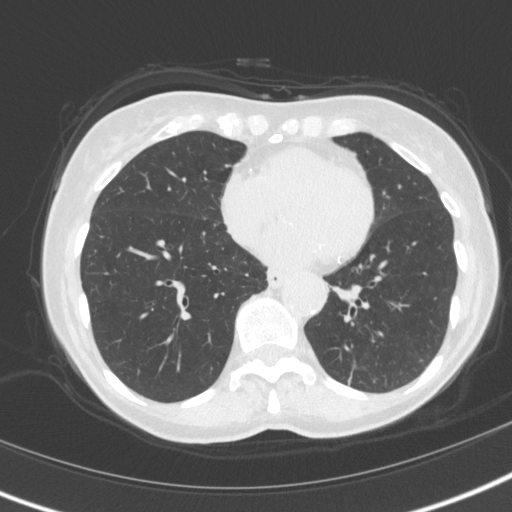
[im 95/166  lung]
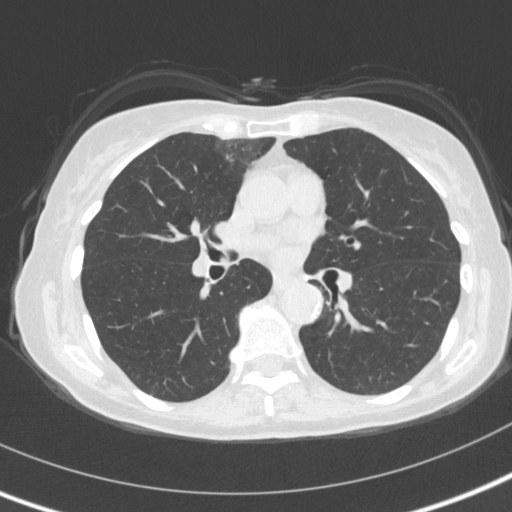
[im 107/166  lung]
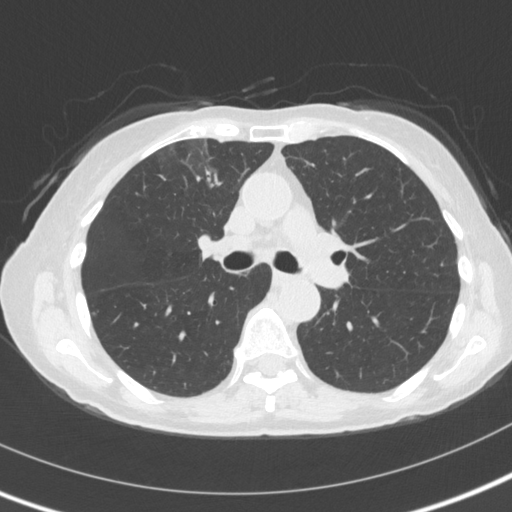
[im 118/166  mediastinal]
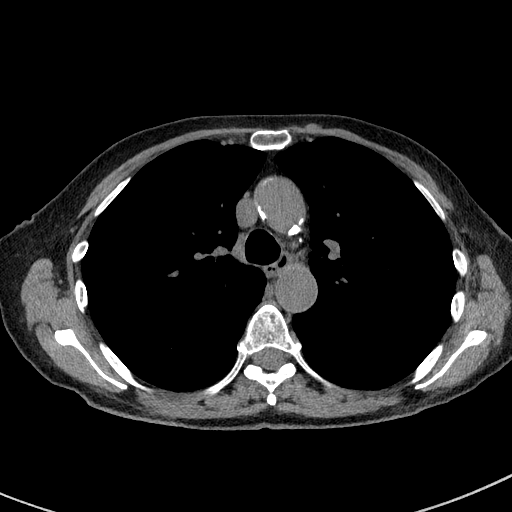
[im 118/166  lung]
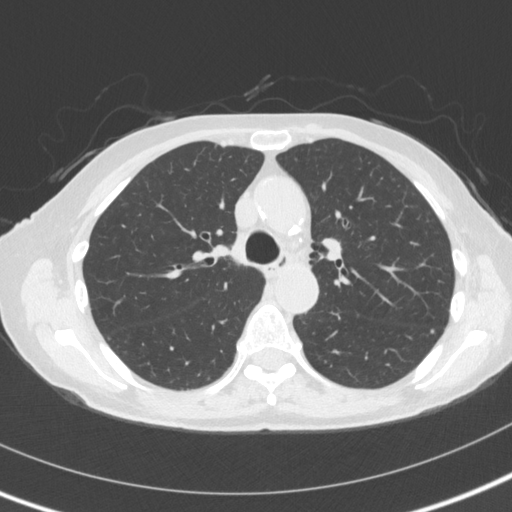
[im 130/166  lung]
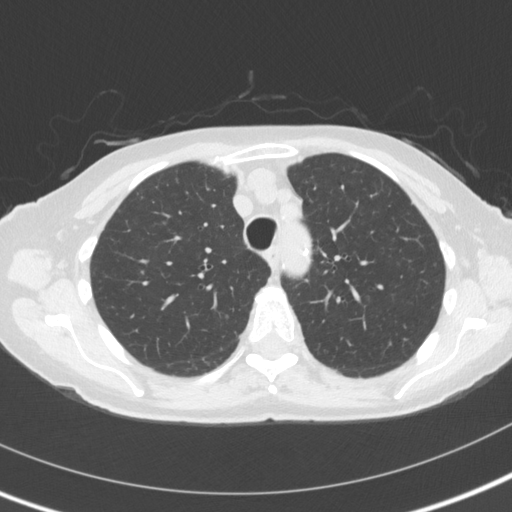
[im 142/166  lung]
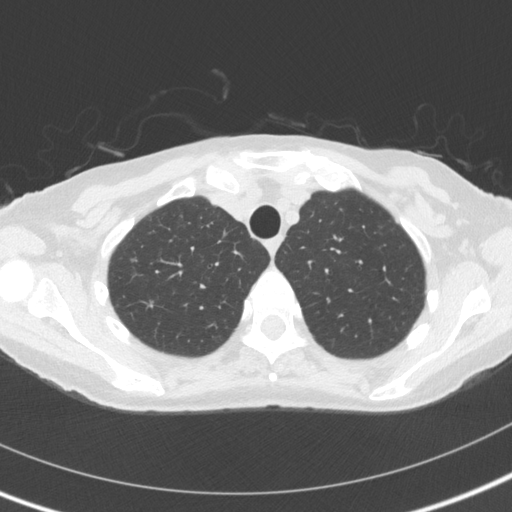
[im 154/166  lung]
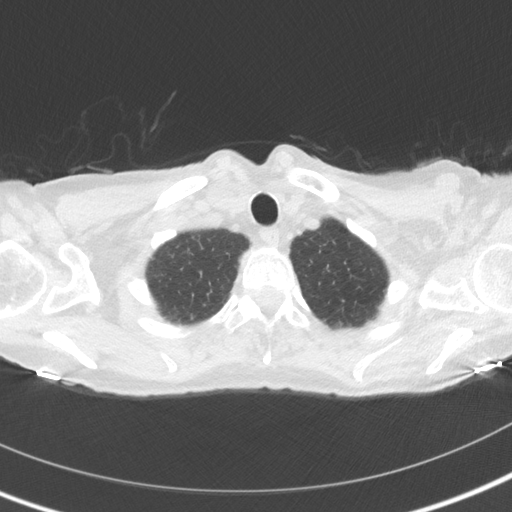

[Series 5: coronal · coronal · 0.61mm/px · 3 of 106 slices shown]
[im 22/106  lung]
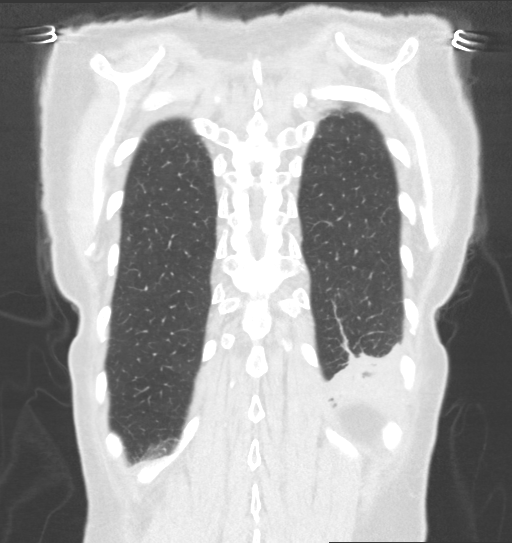
[im 43/106  lung]
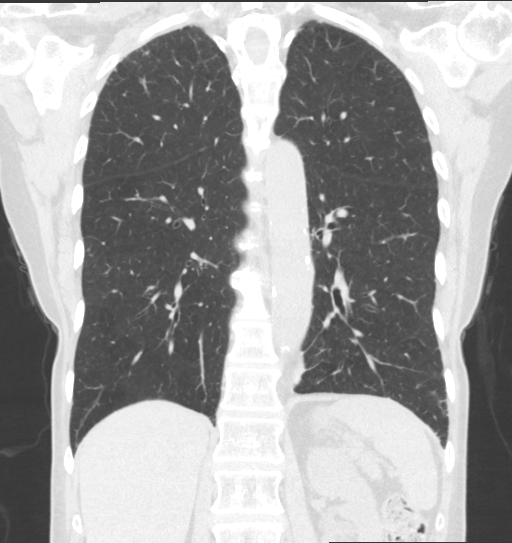
[im 64/106  lung]
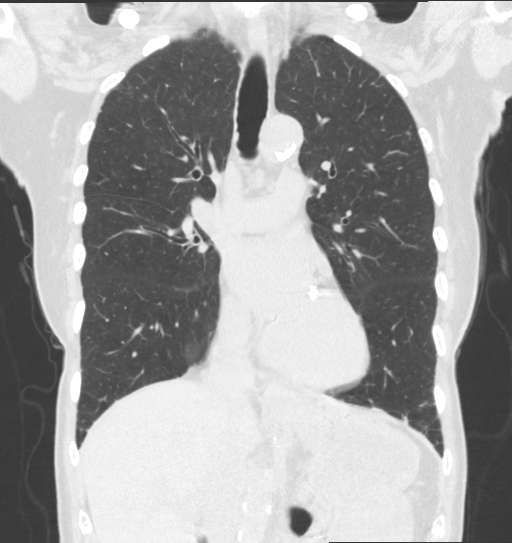

[15 of 36 positions shown; findings below may reference images not displayed]

FINDINGS: Cardiovascular: No acute findings. Aortic and coronary
atherosclerotic calcification incidentally noted.

Mediastinum/Nodes: No masses or pathologically enlarged lymph nodes
identified on this unenhanced exam.

Lungs/Pleura: A few scattered tiny less than 5 mm pulmonary nodules
are again seen bilaterally which remain stable. No suspicious
nodules or masses identified. New pulmonary consolidation with
central air bronchograms and small area of cavitation is seen in the
posterior left lower lobe, highly suspicious for pneumonia. New mild
ground-glass opacity is also seen in the anterior right middle lobe.
No evidence of pleural effusion.

Upper Abdomen:  Unremarkable.

Musculoskeletal:  No suspicious bone lesions.
IMPRESSION: New pulmonary consolidation in posterior left lower lobe, and mild
ground-glass opacity in anterior right middle lobe, suspicious for
pneumonia. Recommend post treatment follow-up by CT in 1-2 months to
confirm resolution.

No evidence of thoracic metastatic disease.

Aortic Atherosclerosis ([id number]).

## 2023-10-04 ENCOUNTER — Emergency Department (HOSPITAL_COMMUNITY)

## 2023-10-04 ENCOUNTER — Inpatient Hospital Stay (HOSPITAL_COMMUNITY)

## 2023-10-04 ENCOUNTER — Encounter (HOSPITAL_COMMUNITY): Payer: Self-pay

## 2023-10-04 ENCOUNTER — Inpatient Hospital Stay (HOSPITAL_COMMUNITY)
Admission: EM | Admit: 2023-10-04 | Discharge: 2023-10-08 | DRG: 418 | Disposition: A | Discharge status: Home / Self Care | Attending: Internal Medicine | Admitting: Internal Medicine

## 2023-10-04 ENCOUNTER — Other Ambulatory Visit: Payer: Self-pay

## 2023-10-04 DIAGNOSIS — E785 Hyperlipidemia, unspecified: Secondary | ICD-10-CM | POA: Diagnosis present

## 2023-10-04 DIAGNOSIS — K8064 Calculus of gallbladder and bile duct with chronic cholecystitis without obstruction: Secondary | ICD-10-CM | POA: Diagnosis present

## 2023-10-04 DIAGNOSIS — R109 Unspecified abdominal pain: Secondary | ICD-10-CM | POA: Diagnosis not present

## 2023-10-04 DIAGNOSIS — E7800 Pure hypercholesterolemia, unspecified: Secondary | ICD-10-CM | POA: Diagnosis not present

## 2023-10-04 DIAGNOSIS — Z885 Allergy status to narcotic agent status: Secondary | ICD-10-CM | POA: Diagnosis not present

## 2023-10-04 DIAGNOSIS — Z85038 Personal history of other malignant neoplasm of large intestine: Secondary | ICD-10-CM

## 2023-10-04 DIAGNOSIS — I2510 Atherosclerotic heart disease of native coronary artery without angina pectoris: Secondary | ICD-10-CM | POA: Diagnosis present

## 2023-10-04 DIAGNOSIS — C182 Malignant neoplasm of ascending colon: Secondary | ICD-10-CM | POA: Diagnosis present

## 2023-10-04 DIAGNOSIS — K838 Other specified diseases of biliary tract: Secondary | ICD-10-CM

## 2023-10-04 DIAGNOSIS — Z681 Body mass index (BMI) 19 or less, adult: Secondary | ICD-10-CM | POA: Diagnosis not present

## 2023-10-04 DIAGNOSIS — Z716 Tobacco abuse counseling: Secondary | ICD-10-CM | POA: Diagnosis not present

## 2023-10-04 DIAGNOSIS — F17210 Nicotine dependence, cigarettes, uncomplicated: Secondary | ICD-10-CM | POA: Diagnosis present

## 2023-10-04 DIAGNOSIS — F17200 Nicotine dependence, unspecified, uncomplicated: Secondary | ICD-10-CM | POA: Diagnosis not present

## 2023-10-04 DIAGNOSIS — Z7982 Long term (current) use of aspirin: Secondary | ICD-10-CM

## 2023-10-04 DIAGNOSIS — R0902 Hypoxemia: Secondary | ICD-10-CM | POA: Diagnosis not present

## 2023-10-04 DIAGNOSIS — E876 Hypokalemia: Secondary | ICD-10-CM | POA: Diagnosis present

## 2023-10-04 DIAGNOSIS — E440 Moderate protein-calorie malnutrition: Secondary | ICD-10-CM | POA: Diagnosis present

## 2023-10-04 DIAGNOSIS — K859 Acute pancreatitis without necrosis or infection, unspecified: Secondary | ICD-10-CM | POA: Diagnosis not present

## 2023-10-04 DIAGNOSIS — Z79899 Other long term (current) drug therapy: Secondary | ICD-10-CM | POA: Diagnosis not present

## 2023-10-04 DIAGNOSIS — I10 Essential (primary) hypertension: Secondary | ICD-10-CM | POA: Diagnosis present

## 2023-10-04 DIAGNOSIS — K851 Biliary acute pancreatitis without necrosis or infection: Secondary | ICD-10-CM | POA: Diagnosis present

## 2023-10-04 DIAGNOSIS — I252 Old myocardial infarction: Secondary | ICD-10-CM

## 2023-10-04 DIAGNOSIS — K660 Peritoneal adhesions (postprocedural) (postinfection): Secondary | ICD-10-CM | POA: Diagnosis present

## 2023-10-04 DIAGNOSIS — I739 Peripheral vascular disease, unspecified: Secondary | ICD-10-CM | POA: Diagnosis present

## 2023-10-04 LAB — CBC WITH DIFFERENTIAL/PLATELET
Abs Immature Granulocytes: 0.07 K/uL (ref 0.00–0.07)
Basophils Absolute: 0.1 K/uL (ref 0.0–0.1)
Basophils Relative: 0 %
Eosinophils Absolute: 0.3 K/uL (ref 0.0–0.5)
Eosinophils Relative: 2 %
HCT: 43.8 % (ref 36.0–46.0)
Hemoglobin: 14.1 g/dL (ref 12.0–15.0)
Immature Granulocytes: 1 %
Lymphocytes Relative: 22 %
Lymphs Abs: 2.8 K/uL (ref 0.7–4.0)
MCH: 31.6 pg (ref 26.0–34.0)
MCHC: 32.2 g/dL (ref 30.0–36.0)
MCV: 98.2 fL (ref 80.0–100.0)
Monocytes Absolute: 1 K/uL (ref 0.1–1.0)
Monocytes Relative: 8 %
Neutro Abs: 8.5 K/uL — ABNORMAL HIGH (ref 1.7–7.7)
Neutrophils Relative %: 67 %
Platelets: 218 K/uL (ref 150–400)
RBC: 4.46 MIL/uL (ref 3.87–5.11)
RDW: 13.7 % (ref 11.5–15.5)
WBC: 12.7 K/uL — ABNORMAL HIGH (ref 4.0–10.5)
nRBC: 0 % (ref 0.0–0.2)

## 2023-10-04 LAB — COMPREHENSIVE METABOLIC PANEL WITH GFR
ALT: 20 U/L (ref 0–44)
AST: 36 U/L (ref 15–41)
Albumin: 3.5 g/dL (ref 3.5–5.0)
Alkaline Phosphatase: 87 U/L (ref 38–126)
Anion gap: 12 (ref 5–15)
BUN: 18 mg/dL (ref 8–23)
CO2: 18 mmol/L — ABNORMAL LOW (ref 22–32)
Calcium: 9.1 mg/dL (ref 8.9–10.3)
Chloride: 107 mmol/L (ref 98–111)
Creatinine, Ser: 0.94 mg/dL (ref 0.44–1.00)
GFR, Estimated: >60 mL/min (ref >60)
Glucose, Bld: 178 mg/dL — ABNORMAL HIGH (ref 70–99)
Potassium: 4.1 mmol/L (ref 3.5–5.1)
Sodium: 137 mmol/L (ref 135–145)
Total Bilirubin: 0.8 mg/dL (ref 0.0–1.2)
Total Protein: 6.1 g/dL — ABNORMAL LOW (ref 6.5–8.1)

## 2023-10-04 LAB — I-STAT CHEM 8, ED
BUN: 17 mg/dL (ref 8–23)
Calcium, Ion: 1.02 mmol/L — ABNORMAL LOW (ref 1.15–1.40)
Chloride: 109 mmol/L (ref 98–111)
Creatinine, Ser: 0.7 mg/dL (ref 0.44–1.00)
Glucose, Bld: 166 mg/dL — ABNORMAL HIGH (ref 70–99)
HCT: 39 % (ref 36.0–46.0)
Hemoglobin: 13.3 g/dL (ref 12.0–15.0)
Potassium: 3.3 mmol/L — ABNORMAL LOW (ref 3.5–5.1)
Sodium: 138 mmol/L (ref 135–145)
TCO2: 18 mmol/L — ABNORMAL LOW (ref 22–32)

## 2023-10-04 LAB — TRIGLYCERIDES: Triglycerides: 27 mg/dL (ref <150)

## 2023-10-04 LAB — LIPASE, BLOOD: Lipase: 1548 U/L — ABNORMAL HIGH (ref 11–51)

## 2023-10-04 LAB — TROPONIN I (HIGH SENSITIVITY)
Troponin I (High Sensitivity): 6 ng/L (ref <18)
Troponin I (High Sensitivity): 10 ng/L (ref <18)

## 2023-10-04 MED ORDER — ASPIRIN 81 MG PO TBEC
81.0000 mg | DELAYED_RELEASE_TABLET | Freq: Every day | ORAL | Status: DC
  Administered 2023-10-05 – 2023-10-08 (×4): 81 mg via ORAL
  Filled 2023-10-04 (×4): qty 1

## 2023-10-04 MED ORDER — ATORVASTATIN CALCIUM 80 MG PO TABS
80.0000 mg | ORAL_TABLET | Freq: Every evening | ORAL | Status: DC
  Administered 2023-10-05 – 2023-10-07 (×3): 80 mg via ORAL
  Filled 2023-10-04 (×3): qty 1

## 2023-10-04 MED ORDER — ONDANSETRON HCL 4 MG/2ML IJ SOLN
4.0000 mg | Freq: Once | INTRAMUSCULAR | Status: AC
  Administered 2023-10-04: 4 mg via INTRAVENOUS
  Filled 2023-10-04: qty 2

## 2023-10-04 MED ORDER — NITROGLYCERIN 0.4 MG SL SUBL: 0.4000 mg | SUBLINGUAL_TABLET | SUBLINGUAL | Status: DC | PRN

## 2023-10-04 MED ORDER — SODIUM CHLORIDE 0.9 % IV BOLUS
1000.0000 mL | Freq: Once | INTRAVENOUS | Status: AC
  Administered 2023-10-04: 1000 mL via INTRAVENOUS

## 2023-10-04 MED ORDER — ACETAMINOPHEN 650 MG RE SUPP: 650.0000 mg | Freq: Four times a day (QID) | RECTAL | Status: DC | PRN

## 2023-10-04 MED ORDER — MORPHINE SULFATE (PF) 4 MG/ML IV SOLN
4.0000 mg | Freq: Once | INTRAVENOUS | Status: AC
  Administered 2023-10-04: 4 mg via INTRAVENOUS
  Filled 2023-10-04: qty 1

## 2023-10-04 MED ORDER — ACETAMINOPHEN 325 MG PO TABS
650.0000 mg | ORAL_TABLET | Freq: Four times a day (QID) | ORAL | Status: DC | PRN
  Administered 2023-10-06: 650 mg via ORAL
  Filled 2023-10-04: qty 2

## 2023-10-04 MED ORDER — GADOBUTROL 1 MMOL/ML IV SOLN
5.0000 mL | Freq: Once | INTRAVENOUS | Status: AC | PRN
  Administered 2023-10-04: 5 mL via INTRAVENOUS

## 2023-10-04 MED ORDER — PANTOPRAZOLE SODIUM 40 MG IV SOLR
40.0000 mg | INTRAVENOUS | Status: DC
  Administered 2023-10-04 – 2023-10-07 (×4): 40 mg via INTRAVENOUS
  Filled 2023-10-04 (×4): qty 10

## 2023-10-04 MED ORDER — ONDANSETRON HCL 4 MG PO TABS: 4.0000 mg | ORAL_TABLET | Freq: Four times a day (QID) | ORAL | Status: DC | PRN

## 2023-10-04 MED ORDER — ENOXAPARIN SODIUM 40 MG/0.4ML IJ SOSY
40.0000 mg | PREFILLED_SYRINGE | INTRAMUSCULAR | Status: DC
  Administered 2023-10-04 – 2023-10-07 (×4): 40 mg via SUBCUTANEOUS
  Filled 2023-10-04 (×4): qty 0.4

## 2023-10-04 MED ORDER — VITAMIN D 25 MCG (1000 UNIT) PO TABS
2000.0000 [IU] | ORAL_TABLET | Freq: Every day | ORAL | Status: DC
  Administered 2023-10-07: 2000 [IU] via ORAL
  Filled 2023-10-04 (×2): qty 2

## 2023-10-04 MED ORDER — SODIUM CHLORIDE 0.9 % IV SOLN: INTRAVENOUS | Status: AC

## 2023-10-04 MED ORDER — METOPROLOL TARTRATE 12.5 MG HALF TABLET
12.5000 mg | ORAL_TABLET | Freq: Two times a day (BID) | ORAL | Status: DC
  Administered 2023-10-05 – 2023-10-08 (×7): 12.5 mg via ORAL
  Filled 2023-10-04 (×7): qty 1

## 2023-10-04 MED ORDER — ONDANSETRON HCL 4 MG/2ML IJ SOLN: 4.0000 mg | Freq: Four times a day (QID) | INTRAMUSCULAR | Status: DC | PRN

## 2023-10-04 MED ORDER — IOHEXOL 350 MG/ML SOLN
75.0000 mL | Freq: Once | INTRAVENOUS | Status: AC | PRN
  Administered 2023-10-04: 75 mL via INTRAVENOUS

## 2023-10-04 MED ORDER — MORPHINE SULFATE (PF) 2 MG/ML IV SOLN: 2.0000 mg | INTRAVENOUS | Status: DC | PRN

## 2023-10-04 MED ORDER — POTASSIUM CHLORIDE 10 MEQ/100ML IV SOLN: 10.0000 meq | Freq: Once | INTRAVENOUS | Status: DC

## 2023-10-04 NOTE — ED Notes (Signed)
Pt to MRI with transport.

## 2023-10-04 NOTE — ED Provider Triage Note (Signed)
 Emergency Medicine Provider Triage Evaluation Note  Alejandra Ford , a 78 y.o. female  was evaluated in triage.  Pt complains of epigastric pain that started about 1 hour prior to arrival. States pain is constant and associated with nausea but no vomiting. No chest pain or shortness of breath. Last BM this morning  Review of Systems  Positive: As above Negative: As above  Physical Exam  There were no vitals taken for this visit. Gen:   Awake, no distress   Resp:  Normal effort  MSK:   Moves extremities without difficulty   Medical Decision Making  Medically screening exam initiated at 12:13 PM.  Appropriate orders placed.  Alejandra Ford was informed that the remainder of the evaluation will be completed by another provider, this initial triage assessment does not replace that evaluation, and the importance of remaining in the ED until their evaluation is complete.     Veta Palma, PA-C 10/04/23 1217

## 2023-10-04 NOTE — Assessment & Plan Note (Signed)
 Continue aspirin  and statin.  Blood pressure control

## 2023-10-04 NOTE — Assessment & Plan Note (Addendum)
 Ascending colon cancer, stage IIa (pT3pN0cM0), status post a right colectomy 10/21/2020  Currently in remission.   Follows up as outpatient with Dr Cloretta

## 2023-10-04 NOTE — Assessment & Plan Note (Signed)
 Continue blood pressure control with metoprolol.

## 2023-10-04 NOTE — H&P (Signed)
 History and Physical    Patient: Alejandra Ford FMW:[id number] DOB: 04/21/1945 DOA: 10/04/2023 DOS: the patient was seen and examined on 10/04/2023 PCP: Chrystal Lamarr RAMAN, MD  Patient coming from: Home  Chief Complaint:  Chief Complaint  Patient presents with   Abdominal Pain   HPI: Alejandra Ford is a 78 y.o. female with medical history significant of arthritis, hyperlipidemia, peripheral vascular disease, who presented with abdominal pain.   She has been at her usual state of health until this morning. Today she had breakfast as usual, a cup of coffee, few hours later she developed severe epigastric abdominal pain, sharp in nature, with no radiation, no improving or worsening factors, associated with nausea and one episode of vomiting.  Because severity of abdominal pain she called EMS. She received ondansetron  and was transported to the hospital.   At the time of my examination she continues to have abdominal pain, but intensity has improved after 4 mg of IV morphine .  She denies prior abdominal pain, or dyspepsia. She takes daily aspirin  and as needed ibuprofen for joint pain.   Review of Systems: As mentioned in the history of present illness. All other systems reviewed and are negative. Past Medical History:  Diagnosis Date   Arthritis    Cancer (HCC)    colon   Dysrhythmia    HLD (hyperlipidemia)    Myocardial infarction [facility])    PAD (peripheral artery disease) (HCC)    Pneumonia    Tobacco abuse    Past Surgical History:  Procedure Laterality Date   BIOPSY  09/10/2020   Procedure: BIOPSY;  Surgeon: Rosalie Kitchens, MD;  Location: [facility] ENDOSCOPY;  Service: Endoscopy;;   COLONOSCOPY N/A 09/10/2020   Procedure: COLONOSCOPY;  Surgeon: Rosalie Kitchens, MD;  Location: [facility] ENDOSCOPY;  Service: Endoscopy;  Laterality: N/A;   LEFT HEART CATH AND CORONARY ANGIOGRAPHY N/A 09/09/2020   Procedure: LEFT HEART CATH AND CORONARY ANGIOGRAPHY;  Surgeon: Darron Deatrice LABOR, MD;  Location: [facility] INVASIVE  CV LAB;  Service: Cardiovascular;  Laterality: N/A;   SUBMUCOSAL TATTOO INJECTION  09/10/2020   Procedure: SUBMUCOSAL TATTOO INJECTION;  Surgeon: Rosalie Kitchens, MD;  Location: [facility] ENDOSCOPY;  Service: Endoscopy;;   wisodm teeth Bilateral    Social History:  reports that she has been smoking cigarettes. She has a 25 pack-year smoking history. She has quit using smokeless tobacco. She reports that she does not drink alcohol and does not use drugs.  Allergies  Allergen Reactions   Oxycodone Nausea And Vomiting    Hallucinations/nightmares Hallucinations/nightmares    Trazodone  Anxiety    Nightmares    Family History  Problem Relation Age of Onset   Lung cancer Neg Hx     Prior to Admission medications   Medication Sig Start Date End Date Taking? Authorizing Provider  acetaminophen  (TYLENOL ) 500 MG tablet Take 500 mg by mouth every 6 (six) hours as needed for moderate pain.   Yes [provider]  aspirin  EC 81 MG tablet Take 81 mg by mouth daily. Swallow whole.   Yes [provider]  atorvastatin  (LIPITOR ) 80 MG tablet Take 1 tablet (80 mg total) by mouth every evening. 09/11/20  Yes Ghimire, Donalda HERO, MD  Cholecalciferol  (VITAMIN D ) 50 MCG (2000 UT) CAPS Take 2,000 Units by mouth at bedtime.   Yes [provider]  ibuprofen (ADVIL) 200 MG tablet Take 200 mg by mouth every 6 (six) hours as needed for moderate pain.   Yes [provider]  metoprolol  tartrate (LOPRESSOR )  25 MG tablet TAKE 1/2 TABLET BY MOUTH TWICE DAILY 08/01/23  Yes Lonni Slain, MD  nitroGLYCERIN  (NITROSTAT ) 0.4 MG SL tablet Place 1 tablet (0.4 mg total) under the tongue every 5 (five) minutes x 3 doses as needed for chest pain. 03/23/23  Yes Lonni Slain, MD    Physical Exam: Vitals:   10/04/23 1530 10/04/23 1545 10/04/23 1600 10/04/23 1627  BP: (!) 140/55 (!) 137/56 (!) 84/69 102/64  Pulse: (!) 53 (!) 51 (!) 55   Resp: 16 14 17    Temp:      SpO2: 98% 99% 100%     BP 102/64   Pulse (!) 55   Temp (!) 96.9 F (36.1 C)   Resp 17   SpO2 100%   Neurology awake and alert, deconditioned but not in pain  ENT with mild pallor with no icterus Cardiovascular with S1 and S2 present and regular with no gallops, rubs or murmurs Respiratory with no rales or wheezing, no rhonchi  Abdomen with no distention, soft but mild tender to palpation in the epigastrium, no rebound or guarding.  No lower extremity edema.   Data Reviewed:   Na 137, K 4,1 CL 107 bicarbonate 18 glucose 178 bun 18 cr 0,94  AST 36 ALT 20 ALK P 87  Lipase 1,548  High sensitive troponin 6  Wbc 12.7 hgb 14.1 plt 218   Chest radiograph with hyperinflation with no cardiomegaly, no infiltrates and no effusions.  EKG 43 bpm, normal axis, normal intervals, qtc 415, sinus rhythm with no significant ST segment or T wave changes.   Abdominal US  with dilated common bile duct measuring up to 10 mm, distal CBD is not visualized due to obscuration by overlying bowel gas. Gallbladder sludge, no cholelithiasis or sonographic evidence of acute cholecystitis.   CT abdomen and pelvis with acute pancreatitis, no abscess or pseudocyst.  Cholelithiasis, mild biliary dilatation.  Post surgical changes of the bowel with ileocolic anastomosis in the right upper abdomen. No bowel obstruction,   Assessment and Plan: * Pancreatitis Likely gallstone pancreatitis, to rule out common bile duct obstruction,   Plan to continue supportive medical care with IV fluids with isotonic saline.  As needed analgesics and antiemetics, scheduled antiacids.  Follow up with MRCP and further GI recommendations,  Likely patient will need cholecystectomy on this hospitalization after recovering from pancreatitis, and completing GI workup.   Essential hypertension Continue blood pressure control with metoprolol .   Hyperlipidemia Continue statin therapy with atorvastatin    PAD (peripheral artery disease) (HCC) Continue  aspirin  and statin.  Blood pressure control   Smoker Smoking cessation counseling Her chest radiograph has signs of air trapping.   Colon cancer, ascending (HCC) Ascending colon cancer, stage IIa ([id number]), status post a right colectomy 10/21/2020  Currently in remission.   Follows up as outpatient with Dr Cloretta    Malnutrition of moderate degree Consult nutrition for recommendations.    Advance Care Planning:   Code Status: Full Code   Consults: GI per ED   Family Communication: I spoke with patient's family at the bedside, we talked in detail about patient's condition, plan of care and prognosis and all questions were addressed.   Severity of Illness: The appropriate patient status for this patient is INPATIENT. Inpatient status is judged to be reasonable and necessary in order to provide the required intensity of service to ensure the patient's safety. The patient's presenting symptoms, physical exam findings, and initial radiographic and laboratory data in the context of their chronic  comorbidities is felt to place them at high risk for further clinical deterioration. Furthermore, it is not anticipated that the patient will be medically stable for discharge from the hospital within 2 midnights of admission.   * I certify that at the point of admission it is my clinical judgment that the patient will require inpatient hospital care spanning beyond 2 midnights from the point of admission due to high intensity of service, high risk for further deterioration and high frequency of surveillance required.*  Author: Elidia Toribio Furnace, MD 10/04/2023 4:35 PM  For on call review [email or web address].

## 2023-10-04 NOTE — Assessment & Plan Note (Signed)
 Smoking cessation counseling Her chest radiograph has signs of air trapping.

## 2023-10-04 NOTE — ED Triage Notes (Signed)
 PT arrives via EMS from home. Pt reports sudden onset of epigastric pain and nausea that started about 1 hour ago. Pt arrives AxOx4. EMS administered 4mg  of zofran , which was effective.

## 2023-10-04 NOTE — Assessment & Plan Note (Signed)
 Likely gallstone pancreatitis, to rule out common bile duct obstruction,   Plan to continue supportive medical care with IV fluids with isotonic saline.  As needed analgesics and antiemetics, scheduled antiacids.  Follow up with MRCP and further GI recommendations,  Likely patient will need cholecystectomy on this hospitalization after recovering from pancreatitis, and completing GI workup.

## 2023-10-04 NOTE — ED Provider Notes (Signed)
 [facility] Provider Note  CSN: [id number] Arrival date & time: 10/04/23 1208  Chief Complaint(s) Abdominal Pain  HPI Alejandra Ford is a 78 y.o. female with past medical history as below, significant for HLD, PAD, tobacco use, femoral-popliteal bypass, aortoiliac femoral bypass who presents to the ED with complaint of abdominal pain, nausea vomiting  Patient here with sudden onset epigastric pain, nausea vomiting that began around an hour prior to arrival.  No fevers or chills, no abdominal trauma, no change in bowel or bladder function.  Denies daily alcohol use, she does smoke cigarettes.  Denies history of pancreatitis in the past.  No excessive NSAID or acetaminophen  use.  No BRBPR or melena.  No sick contacts recent travel, no recent fever.  Past Medical History Past Medical History:  Diagnosis Date   Arthritis    Cancer (HCC)    colon   Dysrhythmia    HLD (hyperlipidemia)    Myocardial infarction [facility])    PAD (peripheral artery disease) (HCC)    Pneumonia    Tobacco abuse    Patient Active Problem List   Diagnosis Date Noted   Pancreatitis 10/04/2023   Malnutrition of moderate degree 10/23/2020   Colon cancer, ascending (HCC) 10/21/2020   Protein-calorie malnutrition, severe 09/09/2020   NSTEMI (non-ST elevated myocardial infarction) (HCC) 09/08/2020   Essential hypertension 09/08/2020   Anemia 09/08/2020   Colonic mass 09/08/2020   Unintended weight loss 09/08/2020   Hyperlipidemia 09/08/2020   Hyponatremia 09/08/2020   Leucocytosis 09/08/2020   PAD (peripheral artery disease) (HCC) 09/08/2020   S/P femoral-popliteal bypass surgery 09/08/2020   H/O aorta-iliac-femoral bypass 09/08/2020   Smoker 09/08/2020   Home Medication(s) Prior to Admission medications   Medication Sig Start Date End Date Taking? Authorizing Provider  acetaminophen  (TYLENOL ) 500 MG tablet Take 500 mg by mouth every 6 (six) hours as needed for  moderate pain.   Yes [provider]  aspirin  EC 81 MG tablet Take 81 mg by mouth daily. Swallow whole.   Yes [provider]  atorvastatin  (LIPITOR ) 80 MG tablet Take 1 tablet (80 mg total) by mouth every evening. 09/11/20  Yes Ghimire, Donalda HERO, MD  Cholecalciferol  (VITAMIN D ) 50 MCG (2000 UT) CAPS Take 2,000 Units by mouth at bedtime.   Yes [provider]  ibuprofen (ADVIL) 200 MG tablet Take 200 mg by mouth every 6 (six) hours as needed for moderate pain.   Yes [provider]  metoprolol  tartrate (LOPRESSOR ) 25 MG tablet TAKE 1/2 TABLET BY MOUTH TWICE DAILY 08/01/23  Yes Lonni Slain, MD  nitroGLYCERIN  (NITROSTAT ) 0.4 MG SL tablet Place 1 tablet (0.4 mg total) under the tongue every 5 (five) minutes x 3 doses as needed for chest pain. 03/23/23  Yes Lonni Slain, MD  Past Surgical History Past Surgical History:  Procedure Laterality Date   BIOPSY  09/10/2020   Procedure: BIOPSY;  Surgeon: Rosalie Kitchens, MD;  Location: [facility] ENDOSCOPY;  Service: Endoscopy;;   COLONOSCOPY N/A 09/10/2020   Procedure: COLONOSCOPY;  Surgeon: Rosalie Kitchens, MD;  Location: [facility] ENDOSCOPY;  Service: Endoscopy;  Laterality: N/A;   LEFT HEART CATH AND CORONARY ANGIOGRAPHY N/A 09/09/2020   Procedure: LEFT HEART CATH AND CORONARY ANGIOGRAPHY;  Surgeon: Darron Deatrice LABOR, MD;  Location: [facility] INVASIVE CV LAB;  Service: Cardiovascular;  Laterality: N/A;   SUBMUCOSAL TATTOO INJECTION  09/10/2020   Procedure: SUBMUCOSAL TATTOO INJECTION;  Surgeon: Rosalie Kitchens, MD;  Location: [facility] ENDOSCOPY;  Service: Endoscopy;;   wisodm teeth Bilateral    Family History Family History  Problem Relation Age of Onset   Lung cancer Neg Hx     Social History Social History   Tobacco Use   Smoking status: Some Days    Current packs/day: 0.50    Average packs/day:  0.5 packs/day for 50.0 years (25.0 ttl pk-yrs)    Types: Cigarettes   Smokeless tobacco: Former   Tobacco comments:    Smokes 3-5 cigarettes/day.  Trying to quit.  02/2023  Vaping Use   Vaping status: Never Used  Substance Use Topics   Alcohol use: Never   Drug use: Never   Allergies Oxycodone and Trazodone   Review of Systems A thorough review of systems was obtained and all systems are negative except as noted in the HPI and PMH.   Physical Exam Vital Signs  I have reviewed the triage vital signs BP (!) 84/69   Pulse (!) 55   Temp (!) 96.9 F (36.1 C)   Resp 17   SpO2 100%  Physical Exam Vitals and nursing note reviewed.  Constitutional:      General: She is not in acute distress.    Appearance: Normal appearance.  HENT:     Head: Normocephalic and atraumatic.     Right Ear: External ear normal.     Left Ear: External ear normal.     Nose: Nose normal.     Mouth/Throat:     Mouth: Mucous membranes are moist.  Eyes:     General: No scleral icterus.       Right eye: No discharge.        Left eye: No discharge.  Cardiovascular:     Rate and Rhythm: Normal rate and regular rhythm.     Pulses: Normal pulses.     Heart sounds: Normal heart sounds.  Pulmonary:     Effort: Pulmonary effort is normal. No respiratory distress.     Breath sounds: Normal breath sounds. No stridor.  Abdominal:     General: Abdomen is flat. There is no distension.     Palpations: Abdomen is soft.     Tenderness: There is abdominal tenderness in the right upper quadrant and epigastric area.  Musculoskeletal:     Cervical back: No rigidity.     Right lower leg: No edema.     Left lower leg: No edema.  Skin:    General: Skin is warm and dry.     Capillary Refill: Capillary refill takes less than 2 seconds.  Neurological:     Mental Status: She is alert.  Psychiatric:        Mood and Affect: Mood normal.        Behavior: Behavior normal. Behavior is cooperative.     ED Results and  Treatments Labs (all labs  ordered are listed, but only abnormal results are displayed) Labs Reviewed  CBC WITH DIFFERENTIAL/PLATELET - Abnormal; Notable for the following components:      Result Value   WBC 12.7 (*)    Neutro Abs 8.5 (*)    All other components within normal limits  COMPREHENSIVE METABOLIC PANEL WITH GFR - Abnormal; Notable for the following components:   CO2 18 (*)    Glucose, Bld 178 (*)    Total Protein 6.1 (*)    All other components within normal limits  LIPASE, BLOOD - Abnormal; Notable for the following components:   Lipase 1,548 (*)    All other components within normal limits  I-STAT CHEM 8, ED - Abnormal; Notable for the following components:   Potassium 3.3 (*)    Glucose, Bld 166 (*)    Calcium , Ion 1.02 (*)    TCO2 18 (*)    All other components within normal limits  TRIGLYCERIDES  TROPONIN I (HIGH SENSITIVITY)  TROPONIN I (HIGH SENSITIVITY)                                                                                                                          Radiology US  Abdomen Limited RUQ (LIVER/GB) Result Date: 10/04/2023 CLINICAL DATA:  28045 Choledocholithiasis 28045 EXAM: ULTRASOUND ABDOMEN LIMITED RIGHT UPPER QUADRANT COMPARISON:  CT scan abdomen and pelvis from earlier the same day. FINDINGS: Gallbladder: No gallstones or wall thickening visualized. Probable small amount of sludge noted. No sonographic Murphy sign noted by sonographer. Common bile duct: Diameter: Dilated measuring up to 9 mm in the proximal portion and up to 10 mm in the midportion. However, distal CBD is not visualized due to obscuration by overlying bowel gas. Liver: No focal lesion identified. Within normal limits in parenchymal echogenicity. Portal vein is patent on color Doppler imaging with normal direction of blood flow towards the liver. Other: None. IMPRESSION: 1. Dilated common bile duct measuring up to 10 mm. Distal CBD is not visualized due to obscuration by overlying  bowel gas. If there is clinical concern for distal CBD obstruction, further evaluation with MRI abdomen/MRCP or ERCP is recommended. 2. Gallbladder sludge. No cholelithiasis or sonographic evidence of acute cholecystitis. Electronically Signed   By: Ree Molt M.D.   On: 10/04/2023 15:41   CT ABDOMEN PELVIS W CONTRAST Result Date: 10/04/2023 CLINICAL DATA:  Epigastric abdominal pain. EXAM: CT ABDOMEN AND PELVIS WITH CONTRAST TECHNIQUE: Multidetector CT imaging of the abdomen and pelvis was performed using the standard protocol following bolus administration of intravenous contrast. RADIATION DOSE REDUCTION: This exam was performed according to the departmental dose-optimization program which includes automated exposure control, adjustment of the mA and/or kV according to patient size and/or use of iterative reconstruction technique. CONTRAST:  75mL OMNIPAQUE  IOHEXOL  350 MG/ML SOLN COMPARISON:  CT dated 08/28/2020. FINDINGS: Lower chest: The visualized lung bases are clear. No intra-abdominal free air or free fluid. Hepatobiliary: The liver is unremarkable. There is mild biliary dilatation. The gallbladder  is distended. Layering small stones noted within the gallbladder. No pericholecystic fluid or dense of acute cholecystitis by CT. The common bile duct is mildly dilated measuring 13 mm in diameter. This is new since the prior CT. MRCP may provide better evaluation. Pancreas: There is inflammatory changes and edema surrounding the pancreas consistent with acute pancreatitis. No abscess or pseudocyst. Spleen: Normal in size without focal abnormality. Adrenals/Urinary Tract: The adrenal glands are unremarkable. There is no hydronephrosis on either side. There is symmetric enhancement and excretion of contrast by both kidneys. The visualized ureters and urinary bladder appear unremarkable. Stomach/Bowel: Postsurgical changes of the bowel with ileocolic anastomosis in the right upper abdomen. There is no bowel  obstruction or active inflammation. Appendectomy. Vascular/Lymphatic: Advanced calcified and noncalcified plaque of the abdominal aorta. Atrophic distal native aorta. Bypass graft again noted. The iliac arteries remain patent. A femorofemoral bypass graft appears thrombosed. The IVC is unremarkable. No portal venous gas. There is no adenopathy. Reproductive: The uterus is grossly unremarkable no suspicious adnexal masses Other: None Musculoskeletal: No acute or significant osseous findings. IMPRESSION: 1. Acute pancreatitis. No abscess or pseudocyst. 2. Cholelithiasis. Mild biliary dilatation. MRCP may provide better evaluation. 3. Postsurgical changes of the bowel with ileocolic anastomosis in the right upper abdomen. No bowel obstruction. 4.  Aortic Atherosclerosis (ICD10-I70.0). Electronically Signed   By: Vanetta Chou M.D.   On: 10/04/2023 13:35   DG Chest 1 View Result Date: 10/04/2023 CLINICAL DATA:  885157 Epigastric pain 114842 EXAM: CHEST  1 VIEW COMPARISON:  Jul 28, 2021 FINDINGS: No focal airspace consolidation, pleural effusion, or pneumothorax. No cardiomegaly. No acute fracture or destructive lesion. Osteopenia. Multilevel thoracic osteophytosis. IMPRESSION: No acute cardiopulmonary abnormality. Electronically Signed   By: Rogelia Myers M.D.   On: 10/04/2023 12:52    Pertinent labs & imaging results that were available during my care of the patient were reviewed by me and considered in my medical decision making (see MDM for details).  Medications Ordered in ED Medications  potassium chloride  10 mEq in 100 mL IVPB (has no administration in time range)  sodium chloride  0.9 % bolus 1,000 mL (1,000 mLs Intravenous New Bag/Given 10/04/23 1310)  iohexol  (OMNIPAQUE ) 350 MG/ML injection 75 mL (75 mLs Intravenous Contrast Given 10/04/23 1321)  ondansetron  (ZOFRAN ) injection 4 mg (4 mg Intravenous Given 10/04/23 1418)  morphine  (PF) 4 MG/ML injection 4 mg (4 mg Intravenous Given 10/04/23 1418)                                                                                                                                      Procedures Procedures  (including critical care time)  Medical Decision Making / ED Course    Medical Decision Making:    Alejandra Ford is a 78 y.o. female with past medical history as below, significant for HLD, PAD, tobacco use, femoral-popliteal bypass, aortoiliac femoral bypass who presents to the ED  with complaint of abdominal pain, nausea vomiting. The complaint involves an extensive differential diagnosis and also carries with it a high risk of complications and morbidity.  Serious etiology was considered. Ddx includes but is not limited to: Differential diagnosis includes but is not exclusive to acute cholecystitis, intrathoracic causes for epigastric abdominal pain, gastritis, duodenitis, pancreatitis, small bowel or large bowel obstruction, abdominal aortic aneurysm, hernia, gastritis, etc.   Complete initial physical exam performed, notably the patient was in no acute distress but appears uncomfortable, epigastric pain, very nauseated.    Reviewed and confirmed nursing documentation for past medical history, family history, social history.  Vital signs reviewed.    Epigastric pain Acute pancreatitis> - Epigastric pain, nausea vomiting, no fever or jaundice.  Imaging concerning for acute pancreatitis there is also biliary ductal dilation and cholelithiasis.  Will get right upper quadrant ultrasound -She has leukocytosis on her CBC, no fever, unlikely sepsis, likely secondary to vomiting - Dilated CBD on CT and US , no visible stone, will get MRCP - Dr Kriss consulted [facility]) - pt saw dr schooler prev in office    Sinus bradycardia> - EKG heart rate is 47, sinus bradycardia, she is on metoprolol  - Heart rate somewhat improved with fluids, hold metoprolol   Hypokalemia> - Likely from vomiting, poor p.o. intake today; will replace  Clinical  Course as of 10/04/23 1626  Wed Oct 04, 2023  1224 Sinus brady 47, new from prior, she is on BB  [SG]    Clinical Course User Index [SG] Elnor Jayson LABOR, DO    Admit hospitalist Gi consult               Additional history obtained: -Additional history obtained from family -External records from outside source obtained and reviewed including: Chart review including previous notes, labs, imaging, consultation notes including  Home medications, prior labs   Lab Tests: -I ordered, reviewed, and interpreted labs.   The pertinent results include:   Labs Reviewed  CBC WITH DIFFERENTIAL/PLATELET - Abnormal; Notable for the following components:      Result Value   WBC 12.7 (*)    Neutro Abs 8.5 (*)    All other components within normal limits  COMPREHENSIVE METABOLIC PANEL WITH GFR - Abnormal; Notable for the following components:   CO2 18 (*)    Glucose, Bld 178 (*)    Total Protein 6.1 (*)    All other components within normal limits  LIPASE, BLOOD - Abnormal; Notable for the following components:   Lipase 1,548 (*)    All other components within normal limits  I-STAT CHEM 8, ED - Abnormal; Notable for the following components:   Potassium 3.3 (*)    Glucose, Bld 166 (*)    Calcium , Ion 1.02 (*)    TCO2 18 (*)    All other components within normal limits  TRIGLYCERIDES  TROPONIN I (HIGH SENSITIVITY)  TROPONIN I (HIGH SENSITIVITY)    Notable for lipase elevated, potassium mildly low  EKG   EKG Interpretation Date/Time:  Wednesday October 04 2023 12:19:21 EDT Ventricular Rate:  43 PR Interval:  166 QRS Duration:  78 QT Interval:  492 QTC Calculation: 415 R Axis:   86  Text Interpretation: Marked sinus bradycardia Abnormal ECG When compared with ECG of 20-Oct-2022 15:51, PREVIOUS ECG IS PRESENT brady is new Confirmed by Elnor Jayson ([id number]) on 10/04/2023 12:24:04 PM         Imaging Studies ordered: I ordered imaging studies including CTAP, RUQ u/s, CXR  I  independently visualized the following imaging with scope of interpretation limited to determining acute life threatening conditions related to emergency care; findings noted above I agree with the radiologist interpretation If any imaging was obtained with contrast I closely monitored patient for any possible adverse reaction a/w contrast administration in the emergency department   Medicines ordered and prescription drug management: Meds ordered this encounter  Medications   sodium chloride  0.9 % bolus 1,000 mL   iohexol  (OMNIPAQUE ) 350 MG/ML injection 75 mL   ondansetron  (ZOFRAN ) injection 4 mg   morphine  (PF) 4 MG/ML injection 4 mg   potassium chloride  10 mEq in 100 mL IVPB    -I have reviewed the patients home medicines and have made adjustments as needed   Consultations Obtained: I requested consultation with the GI, hospitalist,  and discussed lab and imaging findings as well as pertinent plan   Cardiac Monitoring: The patient was maintained on a cardiac monitor.  I personally viewed and interpreted the cardiac monitored which showed an underlying rhythm of: sinus brady Continuous pulse oximetry interpreted by myself, 100% on ra.    Social Determinants of Health:  Diagnosis or treatment significantly limited by social determinants of health: current smoker   Reevaluation: After the interventions noted above, I reevaluated the patient and found that they have improved  Co morbidities that complicate the patient evaluation  Past Medical History:  Diagnosis Date   Arthritis    Cancer (HCC)    colon   Dysrhythmia    HLD (hyperlipidemia)    Myocardial infarction (HCC)    PAD (peripheral artery disease) (HCC)    Pneumonia    Tobacco abuse       Dispostion: Disposition decision including need for hospitalization was considered, and patient admitted to the hospital.    Final Clinical Impression(s) / ED Diagnoses Final diagnoses:  Abdominal pain, unspecified  abdominal location  Acute pancreatitis without infection or necrosis, unspecified pancreatitis type  Common bile duct dilation  Hypokalemia        Elnor Jayson LABOR, DO 10/04/23 1626

## 2023-10-04 NOTE — Assessment & Plan Note (Signed)
Consult nutrition for recommendations.

## 2023-10-04 NOTE — Assessment & Plan Note (Signed)
Continue statin therapy with atorvastatin

## 2023-10-05 DIAGNOSIS — E876 Hypokalemia: Secondary | ICD-10-CM

## 2023-10-05 DIAGNOSIS — K851 Biliary acute pancreatitis without necrosis or infection: Secondary | ICD-10-CM | POA: Diagnosis not present

## 2023-10-05 DIAGNOSIS — K859 Acute pancreatitis without necrosis or infection, unspecified: Secondary | ICD-10-CM

## 2023-10-05 DIAGNOSIS — R109 Unspecified abdominal pain: Secondary | ICD-10-CM | POA: Diagnosis not present

## 2023-10-05 DIAGNOSIS — K838 Other specified diseases of biliary tract: Secondary | ICD-10-CM | POA: Diagnosis not present

## 2023-10-05 LAB — CBC
HCT: 37.9 % (ref 36.0–46.0)
Hemoglobin: 12.8 g/dL (ref 12.0–15.0)
MCH: 32.3 pg (ref 26.0–34.0)
MCHC: 33.8 g/dL (ref 30.0–36.0)
MCV: 95.7 fL (ref 80.0–100.0)
Platelets: 154 K/uL (ref 150–400)
RBC: 3.96 MIL/uL (ref 3.87–5.11)
RDW: 14 % (ref 11.5–15.5)
WBC: 11.6 K/uL — ABNORMAL HIGH (ref 4.0–10.5)
nRBC: 0 % (ref 0.0–0.2)

## 2023-10-05 LAB — BASIC METABOLIC PANEL WITH GFR
Anion gap: 6 (ref 5–15)
BUN: 17 mg/dL (ref 8–23)
CO2: 22 mmol/L (ref 22–32)
Calcium: 8.5 mg/dL — ABNORMAL LOW (ref 8.9–10.3)
Chloride: 109 mmol/L (ref 98–111)
Creatinine, Ser: 0.9 mg/dL (ref 0.44–1.00)
GFR, Estimated: >60 mL/min (ref >60)
Glucose, Bld: 97 mg/dL (ref 70–99)
Potassium: 4.3 mmol/L (ref 3.5–5.1)
Sodium: 137 mmol/L (ref 135–145)

## 2023-10-05 LAB — HEPATIC FUNCTION PANEL
ALT: 19 U/L (ref 0–44)
AST: 29 U/L (ref 15–41)
Albumin: 2.8 g/dL — ABNORMAL LOW (ref 3.5–5.0)
Alkaline Phosphatase: 64 U/L (ref 38–126)
Bilirubin, Direct: <0.1 mg/dL (ref 0.0–0.2)
Total Bilirubin: 0.8 mg/dL (ref 0.0–1.2)
Total Protein: 5.2 g/dL — ABNORMAL LOW (ref 6.5–8.1)

## 2023-10-05 LAB — LIPASE, BLOOD: Lipase: 289 U/L — ABNORMAL HIGH (ref 11–51)

## 2023-10-05 MED ORDER — POLYETHYLENE GLYCOL 3350 17 G PO PACK
17.0000 g | PACK | Freq: Two times a day (BID) | ORAL | Status: AC
  Administered 2023-10-05 – 2023-10-06 (×4): 17 g via ORAL
  Filled 2023-10-05 (×4): qty 1

## 2023-10-05 NOTE — Plan of Care (Signed)
  Problem: Education: Goal: Knowledge of General Education information will improve Description: Including pain rating scale, medication(s)/side effects and non-pharmacologic comfort measures Outcome: Progressing   Problem: Clinical Measurements: Goal: Ability to maintain clinical measurements within normal limits will improve Outcome: Progressing   Problem: Coping: Goal: Level of anxiety will decrease Outcome: Progressing   Problem: Pain Managment: Goal: General experience of comfort will improve and/or be controlled Outcome: Progressing   Problem: Safety: Goal: Ability to remain free from injury will improve Outcome: Progressing   Problem: Skin Integrity: Goal: Risk for impaired skin integrity will decrease Outcome: Progressing

## 2023-10-05 NOTE — Plan of Care (Signed)

## 2023-10-05 NOTE — TOC CM/SW Note (Signed)
 Transition of Care [facility]) - Inpatient Brief Assessment   Patient Details  Name: Alejandra Ford MRN: [id number] Date of Birth: [birth year]  Transition of Care [facility]) CM/SW Contact:    Lauraine FORBES Saa, LCSW Phone Number: 10/05/2023, 8:44 AM   Clinical Narrative:  8:44 AM Per chart review, patient resides at home. Patient has a PCP and insurance. Patient does not have SNF/HH/DME history. Patient's preferred pharmacy is [facility] [phone] [place]. No TOC needs were identified at this time. TOC will continue to follow and be available to assist.  Transition of Care Asessment: Insurance and Status: Insurance coverage has been reviewed Patient has primary care physician: Yes Home environment has been reviewed: Private Residence Prior level of function:: N/A Prior/Current Home Services: No current home services Social Drivers of Health Review: SDOH reviewed no interventions necessary Readmission risk has been reviewed: Yes Transition of care needs: no transition of care needs at this time

## 2023-10-05 NOTE — Consult Note (Signed)
 Alejandra Ford 05/05/1945  [id number].    Requesting MD: Dr. Odell Castor Chief Complaint/Reason for Consult: Biliary Pancreatitis  HPI: Alejandra Ford is a 78 y.o. female with a hx of colon ca s/p R colectomy (in remission per Oncology note earlier this year), HLD, PAD on 81mg  ASA, CAD and tobacco use (4-5 cigarettes day) who presented to the ED for abdominal pain. Patient reports yesterday morning she drank a cup of decaf coffee and a boost and shortly after began having suprapubic pain. The pain began more generalized shortly after and was associated with diaphoresis, chills, nausea and vomiting x 2. No fevers, constipation, diarrhea, or urinary symptoms. She did not try anything for this prior to EMS arrival. She is unsure if pain is worse after po intake. She denies hx of similar symptoms in the past. She has never had issues with her gallbladder or hx of pancreatitis in the past. She denies alcohol use. No recent medication changes. Since arrival her pain has improved, is currently mild and worst in the epigastrium with some pain still in her suprapubic abdomen. Her n/v have resolved.   Prior Abdominal Surgeries: Aorto-bi-common iliac artery bypass by Dr. Oris in 2012, Robotic R colectomy by Dr. Debby in 2022 Blood Thinners: 81mg  asa  ROS: ROS As above, see hpi  Family History  Problem Relation Age of Onset   Lung cancer Neg Hx     Past Medical History:  Diagnosis Date   Arthritis    Cancer (HCC)    colon   Dysrhythmia    HLD (hyperlipidemia)    Myocardial infarction (HCC)    PAD (peripheral artery disease) (HCC)    Pneumonia    Tobacco abuse     Past Surgical History:  Procedure Laterality Date   BIOPSY  09/10/2020   Procedure: BIOPSY;  Surgeon: Rosalie Kitchens, MD;  Location: [facility] ENDOSCOPY;  Service: Endoscopy;;   COLONOSCOPY N/A 09/10/2020   Procedure: COLONOSCOPY;  Surgeon: Rosalie Kitchens, MD;  Location: [facility] ENDOSCOPY;  Service: Endoscopy;  Laterality: N/A;   LEFT HEART  CATH AND CORONARY ANGIOGRAPHY N/A 09/09/2020   Procedure: LEFT HEART CATH AND CORONARY ANGIOGRAPHY;  Surgeon: Darron Deatrice LABOR, MD;  Location: [facility] INVASIVE CV LAB;  Service: Cardiovascular;  Laterality: N/A;   SUBMUCOSAL TATTOO INJECTION  09/10/2020   Procedure: SUBMUCOSAL TATTOO INJECTION;  Surgeon: Rosalie Kitchens, MD;  Location: [facility] ENDOSCOPY;  Service: Endoscopy;;   wisodm teeth Bilateral     Social History:  reports that she has been smoking cigarettes. She has a 25 pack-year smoking history. She has quit using smokeless tobacco. She reports that she does not drink alcohol and does not use drugs.  Allergies:  Allergies  Allergen Reactions   Oxycodone Nausea And Vomiting    Hallucinations/nightmares Hallucinations/nightmares    Trazodone  Anxiety    Nightmares    Medications Prior to Admission  Medication Sig Dispense Refill   acetaminophen  (TYLENOL ) 500 MG tablet Take 500 mg by mouth every 6 (six) hours as needed for moderate pain.     aspirin  EC 81 MG tablet Take 81 mg by mouth daily. Swallow whole.     atorvastatin  (LIPITOR ) 80 MG tablet Take 1 tablet (80 mg total) by mouth every evening. 30 tablet 1   Cholecalciferol  (VITAMIN D ) 50 MCG (2000 UT) CAPS Take 2,000 Units by mouth at bedtime.     ibuprofen (ADVIL) 200 MG tablet Take 200 mg by mouth every 6 (six) hours as needed for moderate pain.  metoprolol  tartrate (LOPRESSOR ) 25 MG tablet TAKE 1/2 TABLET BY MOUTH TWICE DAILY 180 tablet 0   nitroGLYCERIN  (NITROSTAT ) 0.4 MG SL tablet Place 1 tablet (0.4 mg total) under the tongue every 5 (five) minutes x 3 doses as needed for chest pain. 25 tablet 5     Physical Exam: Blood pressure (!) 117/52, pulse 73, temperature 98.6 F (37 C), temperature source Oral, resp. rate 18, SpO2 (!) 89%. General: pleasant, WD/WN female who is laying in bed in NAD HEENT: head is normocephalic, atraumatic.  Sclera are non-icteric.  Heart: regular, rate, and rhythm.   Lungs: CTAB, no wheezes, rhonchi,  or rales noted.  Respiratory effort nonlabored Abd:  Soft, ND, mild generalized ttp with still a decent amount of epigastric ttp. +BS. No masses, hernias, or organomegaly. Noted prior midline abdominal scar that is well healed.  MS: no BUE edema Skin: warm and dry  Psych: A&Ox4 with an appropriate affect Neuro: normal speech, thought process intact, gait not assessed  Results for orders placed or performed during the hospital encounter of 10/04/23 (from the past 48 hours)  CBC with Differential     Status: Abnormal   Collection Time: 10/04/23 12:15 PM  Result Value Ref Range   WBC 12.7 (H) 4.0 - 10.5 K/uL   RBC 4.46 3.87 - 5.11 MIL/uL   Hemoglobin 14.1 12.0 - 15.0 g/dL   HCT 56.1 63.9 - 53.9 %   MCV 98.2 80.0 - 100.0 fL   MCH 31.6 26.0 - 34.0 pg   MCHC 32.2 30.0 - 36.0 g/dL   RDW 86.2 88.4 - 84.4 %   Platelets 218 150 - 400 K/uL   nRBC 0.0 0.0 - 0.2 %   Neutrophils Relative % 67 %   Neutro Abs 8.5 (H) 1.7 - 7.7 K/uL   Lymphocytes Relative 22 %   Lymphs Abs 2.8 0.7 - 4.0 K/uL   Monocytes Relative 8 %   Monocytes Absolute 1.0 0.1 - 1.0 K/uL   Eosinophils Relative 2 %   Eosinophils Absolute 0.3 0.0 - 0.5 K/uL   Basophils Relative 0 %   Basophils Absolute 0.1 0.0 - 0.1 K/uL   Immature Granulocytes 1 %   Abs Immature Granulocytes 0.07 0.00 - 0.07 K/uL    Comment: Performed at [facility], [address], [place], [place] [address]  Comprehensive metabolic panel     Status: Abnormal   Collection Time: 10/04/23 12:15 PM  Result Value Ref Range   Sodium 137 135 - 145 mmol/L   Potassium 4.1 3.5 - 5.1 mmol/L   Chloride 107 98 - 111 mmol/L   CO2 18 (L) 22 - 32 mmol/L   Glucose, Bld 178 (H) 70 - 99 mg/dL    Comment: Glucose reference range applies only to samples taken after fasting for at least 8 hours.   BUN 18 8 - 23 mg/dL   Creatinine, Ser 9.05 0.44 - 1.00 mg/dL   Calcium  9.1 8.9 - 10.3 mg/dL   Total Protein 6.1 (L) 6.5 - 8.1 g/dL   Albumin  3.5 3.5 - 5.0 g/dL   AST 36  15 - 41 U/L   ALT 20 0 - 44 U/L   Alkaline Phosphatase 87 38 - 126 U/L   Total Bilirubin 0.8 0.0 - 1.2 mg/dL   GFR, Estimated >39 >39 mL/min    Comment: (NOTE) Calculated using the CKD-EPI Creatinine Equation (2021)    Anion gap 12 5 - 15    Comment: Performed at [facility]  Lab, [address], [place], [place] [address]  Lipase, blood     Status: Abnormal   Collection Time: 10/04/23 12:15 PM  Result Value Ref Range   Lipase 1,548 (H) 11 - 51 U/L    Comment: RESULT CONFIRMED BY MANUAL DILUTION Performed at [facility], [address], [place], [place] [address]   Troponin I (High Sensitivity)     Status: None   Collection Time: 10/04/23 12:15 PM  Result Value Ref Range   Troponin I (High Sensitivity) 6 <18 ng/L    Comment: (NOTE) Elevated high sensitivity troponin I (hsTnI) values and significant  changes across serial measurements may suggest ACS but many other  chronic and acute conditions are known to elevate hsTnI results.  Refer to the Links section for chest pain algorithms and additional  guidance. Performed at [facility], [address] [place], [place] [address]   I-stat chem 8, ED (not at [facility] or [facility])     Status: Abnormal   Collection Time: 10/04/23 12:31 PM  Result Value Ref Range   Sodium 138 135 - 145 mmol/L   Potassium 3.3 (L) 3.5 - 5.1 mmol/L   Chloride 109 98 - 111 mmol/L   BUN 17 8 - 23 mg/dL   Creatinine, Ser 9.29 0.44 - 1.00 mg/dL   Glucose, Bld 833 (H) 70 - 99 mg/dL    Comment: Glucose reference range applies only to samples taken after fasting for at least 8 hours.   Calcium , Ion 1.02 (L) 1.15 - 1.40 mmol/L   TCO2 18 (L) 22 - 32 mmol/L   Hemoglobin 13.3 12.0 - 15.0 g/dL   HCT 60.9 63.9 - 53.9 %  Troponin I (High Sensitivity)     Status: None   Collection Time: 10/04/23  7:40 PM  Result Value Ref Range   Troponin I (High Sensitivity) 10 <18 ng/L    Comment: (NOTE) Elevated high sensitivity troponin I (hsTnI) values and  significant  changes across serial measurements may suggest ACS but many other  chronic and acute conditions are known to elevate hsTnI results.  Refer to the Links section for chest pain algorithms and additional  guidance. Performed at [facility], [address], [place], [place] [address]   Triglycerides     Status: None   Collection Time: 10/04/23  7:40 PM  Result Value Ref Range   Triglycerides 27 <150 mg/dL    Comment: Performed at [facility], [address] [place], [place] [address]  Basic metabolic panel     Status: Abnormal   Collection Time: 10/05/23  5:56 AM  Result Value Ref Range   Sodium 137 135 - 145 mmol/L   Potassium 4.3 3.5 - 5.1 mmol/L   Chloride 109 98 - 111 mmol/L   CO2 22 22 - 32 mmol/L   Glucose, Bld 97 70 - 99 mg/dL    Comment: Glucose reference range applies only to samples taken after fasting for at least 8 hours.   BUN 17 8 - 23 mg/dL   Creatinine, Ser 9.09 0.44 - 1.00 mg/dL   Calcium  8.5 (L) 8.9 - 10.3 mg/dL   GFR, Estimated >39 >39 mL/min    Comment: (NOTE) Calculated using the CKD-EPI Creatinine Equation (2021)    Anion gap 6 5 - 15    Comment: Performed at [facility], [address], [place], [place] [address]  CBC     Status: Abnormal   Collection Time:  10/05/23  5:56 AM  Result Value Ref Range   WBC 11.6 (H) 4.0 - 10.5 K/uL   RBC 3.96 3.87 - 5.11 MIL/uL   Hemoglobin 12.8 12.0 - 15.0 g/dL   HCT 62.0 63.9 - 53.9 %   MCV 95.7 80.0 - 100.0 fL   MCH 32.3 26.0 - 34.0 pg   MCHC 33.8 30.0 - 36.0 g/dL   RDW 85.9 88.4 - 84.4 %   Platelets 154 150 - 400 K/uL   nRBC 0.0 0.0 - 0.2 %    Comment: Performed at [facility], [address], [place], [place] [address]   MR ABDOMEN MRCP W WO CONTAST Result Date: 10/04/2023 CLINICAL DATA:  Sudden onset epigastric pain. Nausea and vomiting. Pancreatitis on CT. EXAM: MRI ABDOMEN WITHOUT AND WITH CONTRAST (INCLUDING MRCP) TECHNIQUE: Multiplanar multisequence MR imaging of the  abdomen was performed both before and after the administration of intravenous contrast. Heavily T2-weighted images of the biliary and pancreatic ducts were obtained, and three-dimensional MRCP images were rendered by post processing. CONTRAST:  5mL GADAVIST  GADOBUTROL  1 MMOL/ML IV SOLN COMPARISON:  CT same day FINDINGS: Lower chest:  Lung bases are clear. Hepatobiliary: No intrahepatic biliary duct dilatation. The common bile duct is dilated as well as the common hepatic duct. No filling defects in the common bile duct/choledocholithiasis. The common hepatic duct measures 10 mm while the common bile duct measures 7 mm. There stones versus sludge within the fundus of the gallbladder (image 23/3). No gallbladder distension or pericholecystic fluid. Pancreas: There is mild edema within the pancreas. The pancreatic duct is normal caliber. Small amount fluid in the retroperitoneum adjacent pancreatic body and extending into the retroperitoneum inferiorly. No organized collections. No variant pancreatic ductal anatomy. Spleen: Normal spleen. Adrenals/urinary tract: Adrenal glands and kidneys are normal. Stomach/Bowel: Stomach and limited of the small bowel is unremarkable Vascular/Lymphatic: Abdominal aortic normal caliber. No retroperitoneal periportal lymphadenopathy. Musculoskeletal: No aggressive osseous lesion IMPRESSION: 1. Mild acute pancreatitis. No organized collections. No variant ductal anatomy. 2. Moderate dilatation of the common bile duct and common hepatic duct. No choledocholithiasis. 3. Stones versus sludge in the gallbladder. No evidence of acute cholecystitis. Electronically Signed   By: Jackquline Boxer M.D.   On: 10/04/2023 18:38   MR 3D Recon At Scanner Result Date: 10/04/2023 CLINICAL DATA:  Sudden onset epigastric pain. Nausea and vomiting. Pancreatitis on CT. EXAM: MRI ABDOMEN WITHOUT AND WITH CONTRAST (INCLUDING MRCP) TECHNIQUE: Multiplanar multisequence MR imaging of the abdomen was performed  both before and after the administration of intravenous contrast. Heavily T2-weighted images of the biliary and pancreatic ducts were obtained, and three-dimensional MRCP images were rendered by post processing. CONTRAST:  5mL GADAVIST  GADOBUTROL  1 MMOL/ML IV SOLN COMPARISON:  CT same day FINDINGS: Lower chest:  Lung bases are clear. Hepatobiliary: No intrahepatic biliary duct dilatation. The common bile duct is dilated as well as the common hepatic duct. No filling defects in the common bile duct/choledocholithiasis. The common hepatic duct measures 10 mm while the common bile duct measures 7 mm. There stones versus sludge within the fundus of the gallbladder (image 23/3). No gallbladder distension or pericholecystic fluid. Pancreas: There is mild edema within the pancreas. The pancreatic duct is normal caliber. Small amount fluid in the retroperitoneum adjacent pancreatic body and extending into the retroperitoneum inferiorly. No organized collections. No variant pancreatic ductal anatomy. Spleen: Normal spleen. Adrenals/urinary tract: Adrenal glands and kidneys are normal. Stomach/Bowel: Stomach and limited of the small bowel is unremarkable Vascular/Lymphatic: Abdominal  aortic normal caliber. No retroperitoneal periportal lymphadenopathy. Musculoskeletal: No aggressive osseous lesion IMPRESSION: 1. Mild acute pancreatitis. No organized collections. No variant ductal anatomy. 2. Moderate dilatation of the common bile duct and common hepatic duct. No choledocholithiasis. 3. Stones versus sludge in the gallbladder. No evidence of acute cholecystitis. Electronically Signed   By: Jackquline Boxer M.D.   On: 10/04/2023 18:38   US  Abdomen Limited RUQ (LIVER/GB) Result Date: 10/04/2023 CLINICAL DATA:  28045 Choledocholithiasis 28045 EXAM: ULTRASOUND ABDOMEN LIMITED RIGHT UPPER QUADRANT COMPARISON:  CT scan abdomen and pelvis from earlier the same day. FINDINGS: Gallbladder: No gallstones or wall thickening  visualized. Probable small amount of sludge noted. No sonographic Murphy sign noted by sonographer. Common bile duct: Diameter: Dilated measuring up to 9 mm in the proximal portion and up to 10 mm in the midportion. However, distal CBD is not visualized due to obscuration by overlying bowel gas. Liver: No focal lesion identified. Within normal limits in parenchymal echogenicity. Portal vein is patent on color Doppler imaging with normal direction of blood flow towards the liver. Other: None. IMPRESSION: 1. Dilated common bile duct measuring up to 10 mm. Distal CBD is not visualized due to obscuration by overlying bowel gas. If there is clinical concern for distal CBD obstruction, further evaluation with MRI abdomen/MRCP or ERCP is recommended. 2. Gallbladder sludge. No cholelithiasis or sonographic evidence of acute cholecystitis. Electronically Signed   By: Ree Molt M.D.   On: 10/04/2023 15:41   CT ABDOMEN PELVIS W CONTRAST Result Date: 10/04/2023 CLINICAL DATA:  Epigastric abdominal pain. EXAM: CT ABDOMEN AND PELVIS WITH CONTRAST TECHNIQUE: Multidetector CT imaging of the abdomen and pelvis was performed using the standard protocol following bolus administration of intravenous contrast. RADIATION DOSE REDUCTION: This exam was performed according to the departmental dose-optimization program which includes automated exposure control, adjustment of the mA and/or kV according to patient size and/or use of iterative reconstruction technique. CONTRAST:  75mL OMNIPAQUE  IOHEXOL  350 MG/ML SOLN COMPARISON:  CT dated 08/28/2020. FINDINGS: Lower chest: The visualized lung bases are clear. No intra-abdominal free air or free fluid. Hepatobiliary: The liver is unremarkable. There is mild biliary dilatation. The gallbladder is distended. Layering small stones noted within the gallbladder. No pericholecystic fluid or dense of acute cholecystitis by CT. The common bile duct is mildly dilated measuring 13 mm in diameter.  This is new since the prior CT. MRCP may provide better evaluation. Pancreas: There is inflammatory changes and edema surrounding the pancreas consistent with acute pancreatitis. No abscess or pseudocyst. Spleen: Normal in size without focal abnormality. Adrenals/Urinary Tract: The adrenal glands are unremarkable. There is no hydronephrosis on either side. There is symmetric enhancement and excretion of contrast by both kidneys. The visualized ureters and urinary bladder appear unremarkable. Stomach/Bowel: Postsurgical changes of the bowel with ileocolic anastomosis in the right upper abdomen. There is no bowel obstruction or active inflammation. Appendectomy. Vascular/Lymphatic: Advanced calcified and noncalcified plaque of the abdominal aorta. Atrophic distal native aorta. Bypass graft again noted. The iliac arteries remain patent. A femorofemoral bypass graft appears thrombosed. The IVC is unremarkable. No portal venous gas. There is no adenopathy. Reproductive: The uterus is grossly unremarkable no suspicious adnexal masses Other: None Musculoskeletal: No acute or significant osseous findings. IMPRESSION: 1. Acute pancreatitis. No abscess or pseudocyst. 2. Cholelithiasis. Mild biliary dilatation. MRCP may provide better evaluation. 3. Postsurgical changes of the bowel with ileocolic anastomosis in the right upper abdomen. No bowel obstruction. 4.  Aortic Atherosclerosis (ICD10-I70.0). Electronically Signed  By: Vanetta Chou M.D.   On: 10/04/2023 13:35   DG Chest 1 View Result Date: 10/04/2023 CLINICAL DATA:  885157 Epigastric pain 114842 EXAM: CHEST  1 VIEW COMPARISON:  Jul 28, 2021 FINDINGS: No focal airspace consolidation, pleural effusion, or pneumothorax. No cardiomegaly. No acute fracture or destructive lesion. Osteopenia. Multilevel thoracic osteophytosis. IMPRESSION: No acute cardiopulmonary abnormality. Electronically Signed   By: Rogelia Myers M.D.   On: 10/04/2023 12:52    Anti-infectives  (From admission, onward)    None       Assessment/Plan Biliary Pancreatitis  - CT 7/30 w/ Acute pancreatitis without abscess or pseudocyst. Cholelithiasis without mild biliary dilation - RUQ US  7/30 w/ gallbladder sludge without evidence of cholecystitis. Noted dilated common bile duct measuring up to 10 mm  - MRCP 7/30 w/ mild pancreatitis without organized fluid collection, mild dilation of CBD without evidence of choledocholithiasis. Noted gallbladder sludge vs stones without evidence of cholecystitis - Afebrile. WBC 11.6 - Lipase 1548 on admission. Repeat pending. Continue to trend.  - LFT's wnl on admission. Repeat pending. Continue to trend. - TG wnl, no alcohol use, she reports no recent medication changes. - Suspect biliary pancreatitis. Patient would benefit from laparoscopic cholecystectomy during admission to prevent recurrence once her pancreatitis has resolved.  I have explained the procedure, risks, and aftercare of Laparoscopic cholecystectomy.  Risks include but are not limited to anesthesia (MI, CVA, death, prolonged intubation and aspiration), bleeding, infection, wound problems, hernia, bile leak, injury to common bile duct/liver/intestine, possible need for subtotal cholecystectomy or open cholecystectomy, increased risk of DVT/PE and diarrhea post op. She seems to understand and agrees to proceed. - We will continue to follow labs and exam to determine when patient is appropriate for the OR.   FEN - NPO, okay for CLD from our standpoint with NPO at midnight, IVF per [facility] VTE - SCDs, Loveonx, ASA ID - None indicated from our standpoint.   Remote hx of colon ca s/p R colectomy by Dr. Debby 2022 PAD CAD HTN HLD  I reviewed nursing notes, hospitalist notes, last 24 h vitals and pain scores, last 48 h intake and output, last 24 h labs and trends, and last 24 h imaging results. Discussed with GI in person.    Ozell CHRISTELLA Shaper, [facility] 10/05/2023,  10:16 AM Please see Amion for pager number during day hours 7:00am-4:30pm

## 2023-10-05 NOTE — Consult Note (Signed)
 [facility] Gastroenterology Consult  Referring Provider: No ref. provider found Primary Care Physician:  Chrystal Lamarr RAMAN, MD Primary Gastroenterologist: [facility]  Reason for Consultation: Pancreatitis, dilated common bile duct  SUBJECTIVE:   HPI: Alejandra Ford is a 78 y.o. female with past medical history significant for colon cancer diagnosed in 2022 status post surgical resection.  Presents with 1 day history of lower abdominal pain.  Associated with nausea and vomiting.  Some chills, no fever.  No chest pain or shortness of breath.  No changes in bowel habits.  No melena or hematochezia.  Labs have revealed elevated lipase, normal total bilirubin.  Abdominal ultrasound has showed sludge with no cholelithiasis, common bile duct dilated 9-10 mm.  CT scan showed mild biliary dilatation, distended gallbladder, layering stones in the gallbladder, common bile duct 13 mm, pancreatitis changes.  MRCP showed no choledocholithiasis, common hepatic duct 10 mm, common bile duct 7 mm, gallstones and sludge.  Colonoscopy 02/21/2022 (Dr. Rosalie) showed sigmoid diverticulosis, end-to-side ileocolonic anastomosis in the ascending colon, tubular adenoma x 1, hyperplastic polyp x 4.  Past Medical History:  Diagnosis Date   Arthritis    Cancer (HCC)    colon   Dysrhythmia    HLD (hyperlipidemia)    Myocardial infarction [facility])    PAD (peripheral artery disease) (HCC)    Pneumonia    Tobacco abuse    Past Surgical History:  Procedure Laterality Date   BIOPSY  09/10/2020   Procedure: BIOPSY;  Surgeon: Rosalie Kitchens, MD;  Location: [facility] ENDOSCOPY;  Service: Endoscopy;;   COLONOSCOPY N/A 09/10/2020   Procedure: COLONOSCOPY;  Surgeon: Rosalie Kitchens, MD;  Location: [facility] ENDOSCOPY;  Service: Endoscopy;  Laterality: N/A;   LEFT HEART CATH AND CORONARY ANGIOGRAPHY N/A 09/09/2020   Procedure: LEFT HEART CATH AND CORONARY ANGIOGRAPHY;  Surgeon: Darron Deatrice LABOR, MD;  Location: [facility] INVASIVE CV LAB;  Service:  Cardiovascular;  Laterality: N/A;   SUBMUCOSAL TATTOO INJECTION  09/10/2020   Procedure: SUBMUCOSAL TATTOO INJECTION;  Surgeon: Rosalie Kitchens, MD;  Location: [facility] ENDOSCOPY;  Service: Endoscopy;;   wisodm teeth Bilateral    Prior to Admission medications   Medication Sig Start Date End Date Taking? Authorizing Provider  acetaminophen  (TYLENOL ) 500 MG tablet Take 500 mg by mouth every 6 (six) hours as needed for moderate pain.   Yes [provider]  aspirin  EC 81 MG tablet Take 81 mg by mouth daily. Swallow whole.   Yes [provider]  atorvastatin  (LIPITOR ) 80 MG tablet Take 1 tablet (80 mg total) by mouth every evening. 09/11/20  Yes Ghimire, Donalda HERO, MD  Cholecalciferol  (VITAMIN D ) 50 MCG (2000 UT) CAPS Take 2,000 Units by mouth at bedtime.   Yes [provider]  ibuprofen (ADVIL) 200 MG tablet Take 200 mg by mouth every 6 (six) hours as needed for moderate pain.   Yes [provider]  metoprolol  tartrate (LOPRESSOR ) 25 MG tablet TAKE 1/2 TABLET BY MOUTH TWICE DAILY 08/01/23  Yes Lonni Slain, MD  nitroGLYCERIN  (NITROSTAT ) 0.4 MG SL tablet Place 1 tablet (0.4 mg total) under the tongue every 5 (five) minutes x 3 doses as needed for chest pain. 03/23/23  Yes Lonni Slain, MD   Current Facility-Administered Medications  Medication Dose Route Frequency Provider Last Rate Last Admin   0.9 %  sodium chloride  infusion   Intravenous Continuous Arrien, Mauricio Daniel, MD 100 mL/hr at 10/05/23 0628 New Bag at 10/05/23 9371   acetaminophen  (TYLENOL ) tablet 650 mg  650 mg Oral  Q6H PRN Arrien, Mauricio Daniel, MD       Or   acetaminophen  (TYLENOL ) suppository 650 mg  650 mg Rectal Q6H PRN Arrien, Elidia Sieving, MD       aspirin  EC tablet 81 mg  81 mg Oral Daily Arrien, Elidia Sieving, MD   81 mg at 10/05/23 9185   atorvastatin  (LIPITOR ) tablet 80 mg  80 mg Oral QPM Arrien, Elidia Sieving, MD       cholecalciferol  (VITAMIN D3) 25 MCG (1000 UNIT)  tablet 2,000 Units  2,000 Units Oral QHS Arrien, Mauricio Daniel, MD       enoxaparin  (LOVENOX ) injection 40 mg  40 mg Subcutaneous Q24H Noralee Elidia Sieving, MD   40 mg at 10/04/23 2220   metoprolol  tartrate (LOPRESSOR ) tablet 12.5 mg  12.5 mg Oral BID Arrien, Mauricio Daniel, MD   12.5 mg at 10/05/23 9185   morphine  (PF) 2 MG/ML injection 2 mg  2 mg Intravenous Q2H PRN Arrien, Mauricio Daniel, MD       nitroGLYCERIN  (NITROSTAT ) SL tablet 0.4 mg  0.4 mg Sublingual Q5 Min x 3 PRN Arrien, Mauricio Daniel, MD       ondansetron  (ZOFRAN ) tablet 4 mg  4 mg Oral Q6H PRN Arrien, Mauricio Daniel, MD       Or   ondansetron  (ZOFRAN ) injection 4 mg  4 mg Intravenous Q6H PRN Arrien, Mauricio Daniel, MD       pantoprazole  (PROTONIX ) injection 40 mg  40 mg Intravenous Q24H Arrien, Mauricio Daniel, MD   40 mg at 10/04/23 2015   polyethylene glycol (MIRALAX  / GLYCOLAX ) packet 17 g  17 g Oral BID Odell Celinda Balo, MD   17 g at 10/05/23 9185   potassium chloride  10 mEq in 100 mL IVPB  10 mEq Intravenous Once Elnor Savant A, DO       Allergies as of 10/04/2023 - Review Complete 10/04/2023  Allergen Reaction Noted   Oxycodone Nausea And Vomiting 01/23/2017   Trazodone  Anxiety 10/08/2020   Family History  Problem Relation Age of Onset   Lung cancer Neg Hx    Social History   Socioeconomic History   Marital status: Married    Spouse name: Not on file   Number of children: Not on file   Years of education: Not on file   Highest education level: Not on file  Occupational History   Not on file  Tobacco Use   Smoking status: Some Days    Current packs/day: 0.50    Average packs/day: 0.5 packs/day for 50.0 years (25.0 ttl pk-yrs)    Types: Cigarettes   Smokeless tobacco: Former   Tobacco comments:    Smokes 3-5 cigarettes/day.  Trying to quit.  02/2023  Vaping Use   Vaping status: Never Used  Substance and Sexual Activity   Alcohol use: Never   Drug use: Never   Sexual activity: Not on file   Other Topics Concern   Not on file  Social History Narrative   Not on file   Social Drivers of Health   Financial Resource Strain: Not on file  Food Insecurity: No Food Insecurity (10/04/2023)   Hunger Vital Sign    Worried About Running Out of Food in the Last Year: Never true    Ran Out of Food in the Last Year: Never true  Transportation Needs: No Transportation Needs (10/04/2023)   PRAPARE - Administrator, Civil Service (Medical): No    Lack of Transportation (Non-Medical): No  Physical Activity:  Not on file  Stress: Not on file  Social Connections: Not on file  Intimate Partner Violence: Not At Risk (10/04/2023)   Humiliation, Afraid, Rape, and Kick questionnaire    Fear of Current or Ex-Partner: No    Emotionally Abused: No    Physically Abused: No    Sexually Abused: No   Review of Systems:  Review of Systems  Constitutional:  Positive for chills.  Respiratory:  Negative for shortness of breath.   Cardiovascular:  Negative for chest pain.  Gastrointestinal:  Positive for abdominal pain, nausea and vomiting. Negative for blood in stool, constipation and diarrhea.    OBJECTIVE:   Temp:  [96.9 F (36.1 C)-99.6 F (37.6 C)] 98.6 F (37 C) (07/31 0750) Pulse Rate:  [50-76] 73 (07/31 0750) Resp:  [14-25] 18 (07/31 0750) BP: (84-158)/(51-70) 117/52 (07/31 0750) SpO2:  [89 %-100 %] 89 % (07/31 0750) Last BM Date : 10/04/23 Physical Exam Constitutional:      General: She is not in acute distress.    Appearance: She is not ill-appearing, toxic-appearing or diaphoretic.  Cardiovascular:     Rate and Rhythm: Normal rate and regular rhythm.  Pulmonary:     Effort: No respiratory distress.     Breath sounds: Normal breath sounds.  Abdominal:     General: Bowel sounds are normal. There is no distension.     Palpations: Abdomen is soft.     Tenderness: There is abdominal tenderness. There is no guarding.  Skin:    General: Skin is warm and dry.   Neurological:     Mental Status: She is alert.     Labs: Recent Labs    10/04/23 1215 10/04/23 1231 10/05/23 0556  WBC 12.7*  --  11.6*  HGB 14.1 13.3 12.8  HCT 43.8 39.0 37.9  PLT 218  --  154   BMET Recent Labs    10/04/23 1215 10/04/23 1231 10/05/23 0556  NA 137 138 137  K 4.1 3.3* 4.3  CL 107 109 109  CO2 18*  --  22  GLUCOSE 178* 166* 97  BUN 18 17 17   CREATININE 0.94 0.70 0.90  CALCIUM  9.1  --  8.5*   LFT Recent Labs    10/04/23 1215  PROT 6.1*  ALBUMIN  3.5  AST 36  ALT 20  ALKPHOS 87  BILITOT 0.8   PT/INR No results for input(s): LABPROT, INR in the last 72 hours.  Diagnostic imaging: MR ABDOMEN MRCP W WO CONTAST Result Date: 10/04/2023 CLINICAL DATA:  Sudden onset epigastric pain. Nausea and vomiting. Pancreatitis on CT. EXAM: MRI ABDOMEN WITHOUT AND WITH CONTRAST (INCLUDING MRCP) TECHNIQUE: Multiplanar multisequence MR imaging of the abdomen was performed both before and after the administration of intravenous contrast. Heavily T2-weighted images of the biliary and pancreatic ducts were obtained, and three-dimensional MRCP images were rendered by post processing. CONTRAST:  5mL GADAVIST  GADOBUTROL  1 MMOL/ML IV SOLN COMPARISON:  CT same day FINDINGS: Lower chest:  Lung bases are clear. Hepatobiliary: No intrahepatic biliary duct dilatation. The common bile duct is dilated as well as the common hepatic duct. No filling defects in the common bile duct/choledocholithiasis. The common hepatic duct measures 10 mm while the common bile duct measures 7 mm. There stones versus sludge within the fundus of the gallbladder (image 23/3). No gallbladder distension or pericholecystic fluid. Pancreas: There is mild edema within the pancreas. The pancreatic duct is normal caliber. Small amount fluid in the retroperitoneum adjacent pancreatic body and extending into the retroperitoneum  inferiorly. No organized collections. No variant pancreatic ductal anatomy. Spleen:  Normal spleen. Adrenals/urinary tract: Adrenal glands and kidneys are normal. Stomach/Bowel: Stomach and limited of the small bowel is unremarkable Vascular/Lymphatic: Abdominal aortic normal caliber. No retroperitoneal periportal lymphadenopathy. Musculoskeletal: No aggressive osseous lesion IMPRESSION: 1. Mild acute pancreatitis. No organized collections. No variant ductal anatomy. 2. Moderate dilatation of the common bile duct and common hepatic duct. No choledocholithiasis. 3. Stones versus sludge in the gallbladder. No evidence of acute cholecystitis. Electronically Signed   By: Jackquline Boxer M.D.   On: 10/04/2023 18:38   MR 3D Recon At Scanner Result Date: 10/04/2023 CLINICAL DATA:  Sudden onset epigastric pain. Nausea and vomiting. Pancreatitis on CT. EXAM: MRI ABDOMEN WITHOUT AND WITH CONTRAST (INCLUDING MRCP) TECHNIQUE: Multiplanar multisequence MR imaging of the abdomen was performed both before and after the administration of intravenous contrast. Heavily T2-weighted images of the biliary and pancreatic ducts were obtained, and three-dimensional MRCP images were rendered by post processing. CONTRAST:  5mL GADAVIST  GADOBUTROL  1 MMOL/ML IV SOLN COMPARISON:  CT same day FINDINGS: Lower chest:  Lung bases are clear. Hepatobiliary: No intrahepatic biliary duct dilatation. The common bile duct is dilated as well as the common hepatic duct. No filling defects in the common bile duct/choledocholithiasis. The common hepatic duct measures 10 mm while the common bile duct measures 7 mm. There stones versus sludge within the fundus of the gallbladder (image 23/3). No gallbladder distension or pericholecystic fluid. Pancreas: There is mild edema within the pancreas. The pancreatic duct is normal caliber. Small amount fluid in the retroperitoneum adjacent pancreatic body and extending into the retroperitoneum inferiorly. No organized collections. No variant pancreatic ductal anatomy. Spleen: Normal spleen.  Adrenals/urinary tract: Adrenal glands and kidneys are normal. Stomach/Bowel: Stomach and limited of the small bowel is unremarkable Vascular/Lymphatic: Abdominal aortic normal caliber. No retroperitoneal periportal lymphadenopathy. Musculoskeletal: No aggressive osseous lesion IMPRESSION: 1. Mild acute pancreatitis. No organized collections. No variant ductal anatomy. 2. Moderate dilatation of the common bile duct and common hepatic duct. No choledocholithiasis. 3. Stones versus sludge in the gallbladder. No evidence of acute cholecystitis. Electronically Signed   By: Jackquline Boxer M.D.   On: 10/04/2023 18:38   US  Abdomen Limited RUQ (LIVER/GB) Result Date: 10/04/2023 CLINICAL DATA:  [id number] Choledocholithiasis 28045 EXAM: ULTRASOUND ABDOMEN LIMITED RIGHT UPPER QUADRANT COMPARISON:  CT scan abdomen and pelvis from earlier the same day. FINDINGS: Gallbladder: No gallstones or wall thickening visualized. Probable small amount of sludge noted. No sonographic Murphy sign noted by sonographer. Common bile duct: Diameter: Dilated measuring up to 9 mm in the proximal portion and up to 10 mm in the midportion. However, distal CBD is not visualized due to obscuration by overlying bowel gas. Liver: No focal lesion identified. Within normal limits in parenchymal echogenicity. Portal vein is patent on color Doppler imaging with normal direction of blood flow towards the liver. Other: None. IMPRESSION: 1. Dilated common bile duct measuring up to 10 mm. Distal CBD is not visualized due to obscuration by overlying bowel gas. If there is clinical concern for distal CBD obstruction, further evaluation with MRI abdomen/MRCP or ERCP is recommended. 2. Gallbladder sludge. No cholelithiasis or sonographic evidence of acute cholecystitis. Electronically Signed   By: Ree Molt M.D.   On: 10/04/2023 15:41   CT ABDOMEN PELVIS W CONTRAST Result Date: 10/04/2023 CLINICAL DATA:  Epigastric abdominal pain. EXAM: CT ABDOMEN AND  PELVIS WITH CONTRAST TECHNIQUE: Multidetector CT imaging of the abdomen and pelvis was performed using the standard  protocol following bolus administration of intravenous contrast. RADIATION DOSE REDUCTION: This exam was performed according to the departmental dose-optimization program which includes automated exposure control, adjustment of the mA and/or kV according to patient size and/or use of iterative reconstruction technique. CONTRAST:  75mL OMNIPAQUE  IOHEXOL  350 MG/ML SOLN COMPARISON:  CT dated 08/28/2020. FINDINGS: Lower chest: The visualized lung bases are clear. No intra-abdominal free air or free fluid. Hepatobiliary: The liver is unremarkable. There is mild biliary dilatation. The gallbladder is distended. Layering small stones noted within the gallbladder. No pericholecystic fluid or dense of acute cholecystitis by CT. The common bile duct is mildly dilated measuring 13 mm in diameter. This is new since the prior CT. MRCP may provide better evaluation. Pancreas: There is inflammatory changes and edema surrounding the pancreas consistent with acute pancreatitis. No abscess or pseudocyst. Spleen: Normal in size without focal abnormality. Adrenals/Urinary Tract: The adrenal glands are unremarkable. There is no hydronephrosis on either side. There is symmetric enhancement and excretion of contrast by both kidneys. The visualized ureters and urinary bladder appear unremarkable. Stomach/Bowel: Postsurgical changes of the bowel with ileocolic anastomosis in the right upper abdomen. There is no bowel obstruction or active inflammation. Appendectomy. Vascular/Lymphatic: Advanced calcified and noncalcified plaque of the abdominal aorta. Atrophic distal native aorta. Bypass graft again noted. The iliac arteries remain patent. A femorofemoral bypass graft appears thrombosed. The IVC is unremarkable. No portal venous gas. There is no adenopathy. Reproductive: The uterus is grossly unremarkable no suspicious adnexal  masses Other: None Musculoskeletal: No acute or significant osseous findings. IMPRESSION: 1. Acute pancreatitis. No abscess or pseudocyst. 2. Cholelithiasis. Mild biliary dilatation. MRCP may provide better evaluation. 3. Postsurgical changes of the bowel with ileocolic anastomosis in the right upper abdomen. No bowel obstruction. 4.  Aortic Atherosclerosis (ICD10-I70.0). Electronically Signed   By: Vanetta Chou M.D.   On: 10/04/2023 13:35   DG Chest 1 View Result Date: 10/04/2023 CLINICAL DATA:  [id number] Epigastric pain [id number] EXAM: CHEST  1 VIEW COMPARISON:  Jul 28, 2021 FINDINGS: No focal airspace consolidation, pleural effusion, or pneumothorax. No cardiomegaly. No acute fracture or destructive lesion. Osteopenia. Multilevel thoracic osteophytosis. IMPRESSION: No acute cardiopulmonary abnormality. Electronically Signed   By: Rogelia Myers M.D.   On: 10/04/2023 12:52   IMPRESSION: Gallstone pancreatitis Abdominal pain secondary to above Cholelithiasis Dilated bile duct secondary to #1 Personal history colon cancer status postsurgical resection  PLAN: -Continue supportive care for gallstone pancreatitis, IV fluids, pain medications per primary team, diet as tolerated (recommend low-fat) -Appreciate surgical evaluation, would benefit from eventual cholecystectomy -Trend liver enzyme panel -No role for ERCP - [facility] will be available as needed, please call with questions   LOS: 1 day   Estefana Keas, [facility] Gastroenterology

## 2023-10-05 NOTE — Progress Notes (Signed)
 [facility] PROGRESS NOTE    Progress Note  Alejandra Ford  FMW:[id number] DOB: 1945/06/07 DOA: 10/04/2023 PCP: Chrystal Lamarr RAMAN, MD     Brief Narrative:   Alejandra Ford is an 78 y.o. female past medical history significant for arthritis hyperlipidemia peripheral vascular disease who came in with abdominal pain lipase was 1600 white blood cell count of 12, CT scan of the abdomen showed acute pancreatitis no abscess mild biliary dilation.  Abdominal ultrasound shows dilated common bile duct greater than 10 mm.  MRCP showed moderate dilated common bile duct stone and hepatic duct no stone appreciated, but it did showed gallbladder stones and sludge GI was consulted.  Assessment/Plan:   Acute Biliary pancreatitis: Abdominal ultrasound showed dilated common bile duct, MRCP showed dilated common bile duct and hepatic duct no stones appreciated some stones and sludge in the gallbladder. Patient was placed n.p.o., on IV fluids and IV narcotics. Will start her on a bowel regimen. GI has been consulted. Hepatic function panel. Will consult general surgery.  Essential hypertension Continue metoprolol  blood pressure is well-controlled.  Hyperlipidemia Continue statin therapy.  PAD: Continue aspirin  and statins.  Tobacco abuse: She has been counseled.  Ascending stage IIa colon cancer: Status post right colectomy.  Moderate protein caloric malnutrition: Noted.   DVT prophylaxis: lovenox  Family Communication:none Status is: Inpatient Remains inpatient appropriate because: Biliary pancreatitis    Code Status:     Code Status Orders  (From admission, onward)           Start     Ordered   10/04/23 1626  Full code  Continuous       Question:  By:  Answer:  Other   10/04/23 1625           Code Status History     Date Active Date Inactive Code Status Order ID Comments User Context   10/21/2020 1321 10/23/2020 1648 Full Code [id number]  Debby Hila, MD  Inpatient   09/08/2020 1827 09/11/2020 1642 Full Code [id number]  Tobie Yetta HERO, MD ED   09/08/2020 1815 09/08/2020 1827 Full Code [id number]  Tobie Yetta HERO, MD ED      Advance Directive Documentation    Flowsheet Row Most Recent Value  Type of Advance Directive Living will, Healthcare Power of Attorney  Pre-existing out of facility DNR order (yellow form or pink MOST form) --  MOST Form in Place? --      IV Access:   Peripheral IV   Procedures and diagnostic studies:   MR ABDOMEN MRCP W WO CONTAST Result Date: 10/04/2023 CLINICAL DATA:  Sudden onset epigastric pain. Nausea and vomiting. Pancreatitis on CT. EXAM: MRI ABDOMEN WITHOUT AND WITH CONTRAST (INCLUDING MRCP) TECHNIQUE: Multiplanar multisequence MR imaging of the abdomen was performed both before and after the administration of intravenous contrast. Heavily T2-weighted images of the biliary and pancreatic ducts were obtained, and three-dimensional MRCP images were rendered by post processing. CONTRAST:  5mL GADAVIST  GADOBUTROL  1 MMOL/ML IV SOLN COMPARISON:  CT same day FINDINGS: Lower chest:  Lung bases are clear. Hepatobiliary: No intrahepatic biliary duct dilatation. The common bile duct is dilated as well as the common hepatic duct. No filling defects in the common bile duct/choledocholithiasis. The common hepatic duct measures 10 mm while the common bile duct measures 7 mm. There stones versus sludge within the fundus of the gallbladder (image 23/3). No gallbladder distension or pericholecystic fluid. Pancreas: There is mild edema within the pancreas. The pancreatic duct is normal caliber. Small  amount fluid in the retroperitoneum adjacent pancreatic body and extending into the retroperitoneum inferiorly. No organized collections. No variant pancreatic ductal anatomy. Spleen: Normal spleen. Adrenals/urinary tract: Adrenal glands and kidneys are normal. Stomach/Bowel: Stomach and limited of the small bowel is unremarkable  Vascular/Lymphatic: Abdominal aortic normal caliber. No retroperitoneal periportal lymphadenopathy. Musculoskeletal: No aggressive osseous lesion IMPRESSION: 1. Mild acute pancreatitis. No organized collections. No variant ductal anatomy. 2. Moderate dilatation of the common bile duct and common hepatic duct. No choledocholithiasis. 3. Stones versus sludge in the gallbladder. No evidence of acute cholecystitis. Electronically Signed   By: Jackquline Boxer M.D.   On: 10/04/2023 18:38   MR 3D Recon At Scanner Result Date: 10/04/2023 CLINICAL DATA:  Sudden onset epigastric pain. Nausea and vomiting. Pancreatitis on CT. EXAM: MRI ABDOMEN WITHOUT AND WITH CONTRAST (INCLUDING MRCP) TECHNIQUE: Multiplanar multisequence MR imaging of the abdomen was performed both before and after the administration of intravenous contrast. Heavily T2-weighted images of the biliary and pancreatic ducts were obtained, and three-dimensional MRCP images were rendered by post processing. CONTRAST:  5mL GADAVIST  GADOBUTROL  1 MMOL/ML IV SOLN COMPARISON:  CT same day FINDINGS: Lower chest:  Lung bases are clear. Hepatobiliary: No intrahepatic biliary duct dilatation. The common bile duct is dilated as well as the common hepatic duct. No filling defects in the common bile duct/choledocholithiasis. The common hepatic duct measures 10 mm while the common bile duct measures 7 mm. There stones versus sludge within the fundus of the gallbladder (image 23/3). No gallbladder distension or pericholecystic fluid. Pancreas: There is mild edema within the pancreas. The pancreatic duct is normal caliber. Small amount fluid in the retroperitoneum adjacent pancreatic body and extending into the retroperitoneum inferiorly. No organized collections. No variant pancreatic ductal anatomy. Spleen: Normal spleen. Adrenals/urinary tract: Adrenal glands and kidneys are normal. Stomach/Bowel: Stomach and limited of the small bowel is unremarkable Vascular/Lymphatic:  Abdominal aortic normal caliber. No retroperitoneal periportal lymphadenopathy. Musculoskeletal: No aggressive osseous lesion IMPRESSION: 1. Mild acute pancreatitis. No organized collections. No variant ductal anatomy. 2. Moderate dilatation of the common bile duct and common hepatic duct. No choledocholithiasis. 3. Stones versus sludge in the gallbladder. No evidence of acute cholecystitis. Electronically Signed   By: Jackquline Boxer M.D.   On: 10/04/2023 18:38   US  Abdomen Limited RUQ (LIVER/GB) Result Date: 10/04/2023 CLINICAL DATA:  28045 Choledocholithiasis 28045 EXAM: ULTRASOUND ABDOMEN LIMITED RIGHT UPPER QUADRANT COMPARISON:  CT scan abdomen and pelvis from earlier the same day. FINDINGS: Gallbladder: No gallstones or wall thickening visualized. Probable small amount of sludge noted. No sonographic Murphy sign noted by sonographer. Common bile duct: Diameter: Dilated measuring up to 9 mm in the proximal portion and up to 10 mm in the midportion. However, distal CBD is not visualized due to obscuration by overlying bowel gas. Liver: No focal lesion identified. Within normal limits in parenchymal echogenicity. Portal vein is patent on color Doppler imaging with normal direction of blood flow towards the liver. Other: None. IMPRESSION: 1. Dilated common bile duct measuring up to 10 mm. Distal CBD is not visualized due to obscuration by overlying bowel gas. If there is clinical concern for distal CBD obstruction, further evaluation with MRI abdomen/MRCP or ERCP is recommended. 2. Gallbladder sludge. No cholelithiasis or sonographic evidence of acute cholecystitis. Electronically Signed   By: Ree Molt M.D.   On: 10/04/2023 15:41   CT ABDOMEN PELVIS W CONTRAST Result Date: 10/04/2023 CLINICAL DATA:  Epigastric abdominal pain. EXAM: CT ABDOMEN AND PELVIS WITH CONTRAST TECHNIQUE: Multidetector  CT imaging of the abdomen and pelvis was performed using the standard protocol following bolus administration  of intravenous contrast. RADIATION DOSE REDUCTION: This exam was performed according to the departmental dose-optimization program which includes automated exposure control, adjustment of the mA and/or kV according to patient size and/or use of iterative reconstruction technique. CONTRAST:  75mL OMNIPAQUE  IOHEXOL  350 MG/ML SOLN COMPARISON:  CT dated 08/28/2020. FINDINGS: Lower chest: The visualized lung bases are clear. No intra-abdominal free air or free fluid. Hepatobiliary: The liver is unremarkable. There is mild biliary dilatation. The gallbladder is distended. Layering small stones noted within the gallbladder. No pericholecystic fluid or dense of acute cholecystitis by CT. The common bile duct is mildly dilated measuring 13 mm in diameter. This is new since the prior CT. MRCP may provide better evaluation. Pancreas: There is inflammatory changes and edema surrounding the pancreas consistent with acute pancreatitis. No abscess or pseudocyst. Spleen: Normal in size without focal abnormality. Adrenals/Urinary Tract: The adrenal glands are unremarkable. There is no hydronephrosis on either side. There is symmetric enhancement and excretion of contrast by both kidneys. The visualized ureters and urinary bladder appear unremarkable. Stomach/Bowel: Postsurgical changes of the bowel with ileocolic anastomosis in the right upper abdomen. There is no bowel obstruction or active inflammation. Appendectomy. Vascular/Lymphatic: Advanced calcified and noncalcified plaque of the abdominal aorta. Atrophic distal native aorta. Bypass graft again noted. The iliac arteries remain patent. A femorofemoral bypass graft appears thrombosed. The IVC is unremarkable. No portal venous gas. There is no adenopathy. Reproductive: The uterus is grossly unremarkable no suspicious adnexal masses Other: None Musculoskeletal: No acute or significant osseous findings. IMPRESSION: 1. Acute pancreatitis. No abscess or pseudocyst. 2.  Cholelithiasis. Mild biliary dilatation. MRCP may provide better evaluation. 3. Postsurgical changes of the bowel with ileocolic anastomosis in the right upper abdomen. No bowel obstruction. 4.  Aortic Atherosclerosis (ICD10-I70.0). Electronically Signed   By: Vanetta Chou M.D.   On: 10/04/2023 13:35   DG Chest 1 View Result Date: 10/04/2023 CLINICAL DATA:  [id number] Epigastric pain 114842 EXAM: CHEST  1 VIEW COMPARISON:  Jul 28, 2021 FINDINGS: No focal airspace consolidation, pleural effusion, or pneumothorax. No cardiomegaly. No acute fracture or destructive lesion. Osteopenia. Multilevel thoracic osteophytosis. IMPRESSION: No acute cardiopulmonary abnormality. Electronically Signed   By: Rogelia Myers M.D.   On: 10/04/2023 12:52     Medical Consultants:   None.   Subjective:    Alejandra Ford  relates her abdominal pain is controlled  Objective:    Vitals:   10/04/23 1600 10/04/23 1627 10/04/23 2005 10/05/23 0433  BP: (!) 84/69 102/64 (!) 134/55 (!) 110/51  Pulse: (!) 55  69 71  Resp: 17  17 18   Temp:   (!) 97.4 F (36.3 C) 99.6 F (37.6 C)  TempSrc:   Oral Oral  SpO2: 100%  94% 93%   SpO2: 93 %   Intake/Output Summary (Last 24 hours) at 10/05/2023 0703 Last data filed at 10/05/2023 9371 Gross per 24 hour  Intake 1002.98 ml  Output --  Net 1002.98 ml   There were no vitals filed for this visit.  Exam: General exam: In no acute distress. Respiratory system: Good air movement and clear to auscultation. Cardiovascular system: S1 & S2 heard, RRR. No JVD. Gastrointestinal system: Abdomen is nondistended, soft and nontender.  Extremities: No pedal edema. Skin: No rashes, lesions or ulcers Psychiatry: Judgement and insight appear normal. Mood & affect appropriate.    Data Reviewed:    Labs: Basic Metabolic  Panel: Recent Labs  Lab 10/04/23 1215 10/04/23 1231 10/05/23 0556  NA 137 138 137  K 4.1 3.3* 4.3  CL 107 109 109  CO2 18*  --  22  GLUCOSE 178* 166*  97  BUN 18 17 17   CREATININE 0.94 0.70 0.90  CALCIUM  9.1  --  8.5*   GFR CrCl cannot be calculated (Unknown ideal weight.). Liver Function Tests: Recent Labs  Lab 10/04/23 1215  AST 36  ALT 20  ALKPHOS 87  BILITOT 0.8  PROT 6.1*  ALBUMIN  3.5   Recent Labs  Lab 10/04/23 1215  LIPASE 1,548*   No results for input(s): AMMONIA in the last 168 hours. Coagulation profile No results for input(s): INR, PROTIME in the last 168 hours. COVID-19 Labs  No results for input(s): DDIMER, FERRITIN, LDH, CRP in the last 72 hours.  Lab Results  Component Value Date   SARSCOV2NAA RESULT: NEGATIVE 10/19/2020   SARSCOV2NAA NEGATIVE 09/08/2020    CBC: Recent Labs  Lab 10/04/23 1215 10/04/23 1231 10/05/23 0556  WBC 12.7*  --  11.6*  NEUTROABS 8.5*  --   --   HGB 14.1 13.3 12.8  HCT 43.8 39.0 37.9  MCV 98.2  --  95.7  PLT 218  --  154   Cardiac Enzymes: No results for input(s): CKTOTAL, CKMB, CKMBINDEX, TROPONINI in the last 168 hours. BNP (last 3 results) No results for input(s): PROBNP in the last 8760 hours. CBG: No results for input(s): GLUCAP in the last 168 hours. D-Dimer: No results for input(s): DDIMER in the last 72 hours. Hgb A1c: No results for input(s): HGBA1C in the last 72 hours. Lipid Profile: Recent Labs    10/04/23 1940  TRIG 27   Thyroid function studies: No results for input(s): TSH, T4TOTAL, T3FREE, THYROIDAB in the last 72 hours.  Invalid input(s): FREET3 Anemia work up: No results for input(s): VITAMINB12, FOLATE, FERRITIN, TIBC, IRON, RETICCTPCT in the last 72 hours. Sepsis Labs: Recent Labs  Lab 10/04/23 1215 10/05/23 0556  WBC 12.7* 11.6*   Microbiology No results found for this or any previous visit (from the past 240 hours).   Medications:    aspirin  EC  81 mg Oral Daily   atorvastatin   80 mg Oral QPM   cholecalciferol   2,000 Units Oral QHS   enoxaparin  (LOVENOX ) injection  40  mg Subcutaneous Q24H   metoprolol  tartrate  12.5 mg Oral BID   pantoprazole  (PROTONIX ) IV  40 mg Intravenous Q24H   Continuous Infusions:  sodium chloride  100 mL/hr at 10/05/23 9371   potassium chloride         LOS: 1 day   Alejandra Ford  [facility]  10/05/2023, 7:03 AM

## 2023-10-06 DIAGNOSIS — K851 Biliary acute pancreatitis without necrosis or infection: Secondary | ICD-10-CM | POA: Diagnosis not present

## 2023-10-06 DIAGNOSIS — K859 Acute pancreatitis without necrosis or infection, unspecified: Secondary | ICD-10-CM | POA: Diagnosis not present

## 2023-10-06 DIAGNOSIS — R109 Unspecified abdominal pain: Secondary | ICD-10-CM | POA: Diagnosis not present

## 2023-10-06 DIAGNOSIS — K838 Other specified diseases of biliary tract: Secondary | ICD-10-CM | POA: Diagnosis not present

## 2023-10-06 LAB — COMPREHENSIVE METABOLIC PANEL WITH GFR
ALT: 16 U/L (ref 0–44)
AST: 29 U/L (ref 15–41)
Albumin: 2.8 g/dL — ABNORMAL LOW (ref 3.5–5.0)
Alkaline Phosphatase: 54 U/L (ref 38–126)
Anion gap: 9 (ref 5–15)
BUN: 14 mg/dL (ref 8–23)
CO2: 18 mmol/L — ABNORMAL LOW (ref 22–32)
Calcium: 8.4 mg/dL — ABNORMAL LOW (ref 8.9–10.3)
Chloride: 109 mmol/L (ref 98–111)
Creatinine, Ser: 0.81 mg/dL (ref 0.44–1.00)
GFR, Estimated: >60 mL/min (ref >60)
Glucose, Bld: 62 mg/dL — ABNORMAL LOW (ref 70–99)
Potassium: 3.6 mmol/L (ref 3.5–5.1)
Sodium: 136 mmol/L (ref 135–145)
Total Bilirubin: 1.3 mg/dL — ABNORMAL HIGH (ref 0.0–1.2)
Total Protein: 5.2 g/dL — ABNORMAL LOW (ref 6.5–8.1)

## 2023-10-06 LAB — LIPASE, BLOOD: Lipase: 62 U/L — ABNORMAL HIGH (ref 11–51)

## 2023-10-06 MED ORDER — INDOCYANINE GREEN 25 MG IV SOLR
2.5000 mg | INTRAVENOUS | Status: AC
  Administered 2023-10-07: 2.5 mg via INTRAVENOUS
  Filled 2023-10-06: qty 10

## 2023-10-06 NOTE — Progress Notes (Signed)
 Subjective/Chief Complaint: Patient having less abdominal pain today.  She was having some hypoxemia yesterday but that seems to be better today.   Objective: Vital signs in last 24 hours: Temp:  [98.3 F (36.8 C)-99.3 F (37.4 C)] 98.3 F (36.8 C) (08/01 0417) Pulse Rate:  [73-81] 81 (08/01 0417) Resp:  [18] 18 (08/01 0417) BP: (106-122)/(49-65) 106/65 (08/01 0417) SpO2:  [88 %-91 %] 88 % (08/01 0417) Last BM Date : 10/04/23  Intake/Output from previous day: 07/31 0701 - 08/01 0700 In: 1491.4 [P.O.:100; I.V.:1391.4] Out: -  Intake/Output this shift: No intake/output data recorded.  Abdomen: Scaphoid.  Mild to moderate epigastric tenderness without rebound or guarding.  Midline scar noted.  No hernia.  Lab Results:  Recent Labs    10/04/23 1215 10/04/23 1231 10/05/23 0556  WBC 12.7*  --  11.6*  HGB 14.1 13.3 12.8  HCT 43.8 39.0 37.9  PLT 218  --  154   BMET Recent Labs    10/05/23 0556 10/06/23 0459  NA 137 136  K 4.3 3.6  CL 109 109  CO2 22 18*  GLUCOSE 97 62*  BUN 17 14  CREATININE 0.90 0.81  CALCIUM  8.5* 8.4*   PT/INR No results for input(s): LABPROT, INR in the last 72 hours. ABG No results for input(s): PHART, HCO3 in the last 72 hours.  Invalid input(s): PCO2, PO2  Studies/Results: MR ABDOMEN MRCP W WO CONTAST Result Date: 10/04/2023 CLINICAL DATA:  Sudden onset epigastric pain. Nausea and vomiting. Pancreatitis on CT. EXAM: MRI ABDOMEN WITHOUT AND WITH CONTRAST (INCLUDING MRCP) TECHNIQUE: Multiplanar multisequence MR imaging of the abdomen was performed both before and after the administration of intravenous contrast. Heavily T2-weighted images of the biliary and pancreatic ducts were obtained, and three-dimensional MRCP images were rendered by post processing. CONTRAST:  5mL GADAVIST  GADOBUTROL  1 MMOL/ML IV SOLN COMPARISON:  CT same day FINDINGS: Lower chest:  Lung bases are clear. Hepatobiliary: No intrahepatic biliary duct  dilatation. The common bile duct is dilated as well as the common hepatic duct. No filling defects in the common bile duct/choledocholithiasis. The common hepatic duct measures 10 mm while the common bile duct measures 7 mm. There stones versus sludge within the fundus of the gallbladder (image 23/3). No gallbladder distension or pericholecystic fluid. Pancreas: There is mild edema within the pancreas. The pancreatic duct is normal caliber. Small amount fluid in the retroperitoneum adjacent pancreatic body and extending into the retroperitoneum inferiorly. No organized collections. No variant pancreatic ductal anatomy. Spleen: Normal spleen. Adrenals/urinary tract: Adrenal glands and kidneys are normal. Stomach/Bowel: Stomach and limited of the small bowel is unremarkable Vascular/Lymphatic: Abdominal aortic normal caliber. No retroperitoneal periportal lymphadenopathy. Musculoskeletal: No aggressive osseous lesion IMPRESSION: 1. Mild acute pancreatitis. No organized collections. No variant ductal anatomy. 2. Moderate dilatation of the common bile duct and common hepatic duct. No choledocholithiasis. 3. Stones versus sludge in the gallbladder. No evidence of acute cholecystitis. Electronically Signed   By: Jackquline Boxer M.D.   On: 10/04/2023 18:38   MR 3D Recon At Scanner Result Date: 10/04/2023 CLINICAL DATA:  Sudden onset epigastric pain. Nausea and vomiting. Pancreatitis on CT. EXAM: MRI ABDOMEN WITHOUT AND WITH CONTRAST (INCLUDING MRCP) TECHNIQUE: Multiplanar multisequence MR imaging of the abdomen was performed both before and after the administration of intravenous contrast. Heavily T2-weighted images of the biliary and pancreatic ducts were obtained, and three-dimensional MRCP images were rendered by post processing. CONTRAST:  5mL GADAVIST  GADOBUTROL  1 MMOL/ML IV SOLN COMPARISON:  CT same  day FINDINGS: Lower chest:  Lung bases are clear. Hepatobiliary: No intrahepatic biliary duct dilatation. The  common bile duct is dilated as well as the common hepatic duct. No filling defects in the common bile duct/choledocholithiasis. The common hepatic duct measures 10 mm while the common bile duct measures 7 mm. There stones versus sludge within the fundus of the gallbladder (image 23/3). No gallbladder distension or pericholecystic fluid. Pancreas: There is mild edema within the pancreas. The pancreatic duct is normal caliber. Small amount fluid in the retroperitoneum adjacent pancreatic body and extending into the retroperitoneum inferiorly. No organized collections. No variant pancreatic ductal anatomy. Spleen: Normal spleen. Adrenals/urinary tract: Adrenal glands and kidneys are normal. Stomach/Bowel: Stomach and limited of the small bowel is unremarkable Vascular/Lymphatic: Abdominal aortic normal caliber. No retroperitoneal periportal lymphadenopathy. Musculoskeletal: No aggressive osseous lesion IMPRESSION: 1. Mild acute pancreatitis. No organized collections. No variant ductal anatomy. 2. Moderate dilatation of the common bile duct and common hepatic duct. No choledocholithiasis. 3. Stones versus sludge in the gallbladder. No evidence of acute cholecystitis. Electronically Signed   By: Jackquline Boxer M.D.   On: 10/04/2023 18:38   US  Abdomen Limited RUQ (LIVER/GB) Result Date: 10/04/2023 CLINICAL DATA:  28045 Choledocholithiasis 28045 EXAM: ULTRASOUND ABDOMEN LIMITED RIGHT UPPER QUADRANT COMPARISON:  CT scan abdomen and pelvis from earlier the same day. FINDINGS: Gallbladder: No gallstones or wall thickening visualized. Probable small amount of sludge noted. No sonographic Murphy sign noted by sonographer. Common bile duct: Diameter: Dilated measuring up to 9 mm in the proximal portion and up to 10 mm in the midportion. However, distal CBD is not visualized due to obscuration by overlying bowel gas. Liver: No focal lesion identified. Within normal limits in parenchymal echogenicity. Portal vein is patent on  color Doppler imaging with normal direction of blood flow towards the liver. Other: None. IMPRESSION: 1. Dilated common bile duct measuring up to 10 mm. Distal CBD is not visualized due to obscuration by overlying bowel gas. If there is clinical concern for distal CBD obstruction, further evaluation with MRI abdomen/MRCP or ERCP is recommended. 2. Gallbladder sludge. No cholelithiasis or sonographic evidence of acute cholecystitis. Electronically Signed   By: Ree Molt M.D.   On: 10/04/2023 15:41   CT ABDOMEN PELVIS W CONTRAST Result Date: 10/04/2023 CLINICAL DATA:  Epigastric abdominal pain. EXAM: CT ABDOMEN AND PELVIS WITH CONTRAST TECHNIQUE: Multidetector CT imaging of the abdomen and pelvis was performed using the standard protocol following bolus administration of intravenous contrast. RADIATION DOSE REDUCTION: This exam was performed according to the departmental dose-optimization program which includes automated exposure control, adjustment of the mA and/or kV according to patient size and/or use of iterative reconstruction technique. CONTRAST:  75mL OMNIPAQUE  IOHEXOL  350 MG/ML SOLN COMPARISON:  CT dated 08/28/2020. FINDINGS: Lower chest: The visualized lung bases are clear. No intra-abdominal free air or free fluid. Hepatobiliary: The liver is unremarkable. There is mild biliary dilatation. The gallbladder is distended. Layering small stones noted within the gallbladder. No pericholecystic fluid or dense of acute cholecystitis by CT. The common bile duct is mildly dilated measuring 13 mm in diameter. This is new since the prior CT. MRCP may provide better evaluation. Pancreas: There is inflammatory changes and edema surrounding the pancreas consistent with acute pancreatitis. No abscess or pseudocyst. Spleen: Normal in size without focal abnormality. Adrenals/Urinary Tract: The adrenal glands are unremarkable. There is no hydronephrosis on either side. There is symmetric enhancement and excretion of  contrast by both kidneys. The visualized ureters and urinary  bladder appear unremarkable. Stomach/Bowel: Postsurgical changes of the bowel with ileocolic anastomosis in the right upper abdomen. There is no bowel obstruction or active inflammation. Appendectomy. Vascular/Lymphatic: Advanced calcified and noncalcified plaque of the abdominal aorta. Atrophic distal native aorta. Bypass graft again noted. The iliac arteries remain patent. A femorofemoral bypass graft appears thrombosed. The IVC is unremarkable. No portal venous gas. There is no adenopathy. Reproductive: The uterus is grossly unremarkable no suspicious adnexal masses Other: None Musculoskeletal: No acute or significant osseous findings. IMPRESSION: 1. Acute pancreatitis. No abscess or pseudocyst. 2. Cholelithiasis. Mild biliary dilatation. MRCP may provide better evaluation. 3. Postsurgical changes of the bowel with ileocolic anastomosis in the right upper abdomen. No bowel obstruction. 4.  Aortic Atherosclerosis (ICD10-I70.0). Electronically Signed   By: Vanetta Chou M.D.   On: 10/04/2023 13:35   DG Chest 1 View Result Date: 10/04/2023 CLINICAL DATA:  [id number] Epigastric pain 114842 EXAM: CHEST  1 VIEW COMPARISON:  Jul 28, 2021 FINDINGS: No focal airspace consolidation, pleural effusion, or pneumothorax. No cardiomegaly. No acute fracture or destructive lesion. Osteopenia. Multilevel thoracic osteophytosis. IMPRESSION: No acute cardiopulmonary abnormality. Electronically Signed   By: Rogelia Myers M.D.   On: 10/04/2023 12:52    Anti-infectives: Anti-infectives (From admission, onward)    None       Assessment/Plan: Gallstone pancreatitis-her pain is better today and she is now off oxygen.  Plan will be for laparoscopic cholecystectomy tomorrow if time permits.  I discussed this with the patient and her husband at the bedside today.The procedure has been discussed with the patient. Operative and non operative treatments have been  discussed. Risks of surgery include bleeding, infection,  Common bile duct injury,  Injury to the stomach,liver, colon,small intestine, abdominal wall,  Diaphragm,  Major blood vessels,  And the need for an open procedure.  Other risks include worsening of medical problems, death,  DVT and pulmonary embolism, and cardiovascular events.   Medical options have also been discussed. The patient has been informed of long term expectations of surgery and non surgical options,  The patient agrees to proceed.      LOS: 2 days    Debby A Winston Misner 10/06/2023 Moderate

## 2023-10-06 NOTE — Anesthesia Preprocedure Evaluation (Addendum)
 Anesthesia Evaluation  Patient identified by MRN, date of birth, ID band Patient awake    Reviewed: Allergy & Precautions, NPO status , Patient's Chart, lab work & pertinent test results  History of Anesthesia Complications Negative for: history of anesthetic complications  Airway Mallampati: I  TM Distance: >3 FB Neck ROM: Full    Dental  (+) Dental Advisory Given   Pulmonary Current Smoker and Patient abstained from smoking.   breath sounds clear to auscultation       Cardiovascular hypertension, Pt. on medications and Pt. on home beta blockers (-) angina + CAD (single vesset Cx, has collaterals), + Past MI and + Peripheral Vascular Disease   Rhythm:Regular Rate:Normal  '22 cath:  1.  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  There is also moderate proximal RCA disease. 2.  Left ventricular angiography was not performed.  EF was low normal by echo with posterior wall hypokinesis.   '22 ECHO: EF 50 to 55%.  1.The left ventricle has low normal function, regional wall motion abnormalities (see scoring diagram/findings for description). Left ventricular diastolic parameters are indeterminate. Elevated left ventricular end-diastolic pressure. There is hypokinesis of the left ventricular, mid-apical inferior wall, inferolateral wall and anterolateral wall. There is akinesis of the left ventricular, apical inferior wall.   2. RVF is normal. The right ventricular size is normal.   3. The mitral valve is normal in structure. Mild mitral valve regurgitation. No evidence of mitral stenosis.   4. The aortic valve is tricuspid. There is mild calcification of the aortic valve. There is mild thickening of the aortic valve. Aortic valve regurgitation is not visualized. Mild aortic valve sclerosis is present, with no evidence of aortic valve stenosis.      Neuro/Psych negative neurological ROS     GI/Hepatic Neg liver ROS,,,Acute cholecystitis H/o colon cancer   Endo/Other  negative endocrine ROS    Renal/GU negative Renal ROS     Musculoskeletal  (+) Arthritis ,    Abdominal   Peds  Hematology Hb 12.8, plt 154k   Anesthesia Other Findings   Reproductive/Obstetrics                              Anesthesia Physical Anesthesia Plan  ASA: 3  Anesthesia Plan: General   Post-op Pain Management: Tylenol  PO (pre-op)*   Induction: Intravenous  PONV Risk Score and Plan: 2 and Ondansetron  and Dexamethasone   Airway Management Planned: Oral ETT  Additional Equipment: None  Intra-op Plan:   Post-operative Plan: Extubation in OR  Informed Consent: I have reviewed the patients History and Physical, chart, labs and discussed the procedure including the risks, benefits and alternatives for the proposed anesthesia with the patient or authorized representative who has indicated his/her understanding and acceptance.     Dental advisory given  Plan Discussed with: CRNA and Surgeon  Anesthesia Plan Comments:          Anesthesia Quick Evaluation

## 2023-10-06 NOTE — Plan of Care (Signed)

## 2023-10-06 NOTE — Progress Notes (Signed)
 [facility] PROGRESS NOTE    Progress Note  Alejandra Ford  FMW:[id number] DOB: [birth year] DOA: 10/04/2023 PCP: Chrystal Lamarr RAMAN, MD     Brief Narrative:   Alejandra Ford is an 78 y.o. female past medical history significant for arthritis hyperlipidemia peripheral vascular disease who came in with abdominal pain lipase was 1600 white blood cell count of 12, CT scan of the abdomen showed acute pancreatitis no abscess mild biliary dilation.  Abdominal ultrasound shows dilated common bile duct greater than 10 mm.  MRCP showed moderate dilated common bile duct stone and hepatic duct no stone appreciated, but it did showed gallbladder stones and sludge GI was consulted.  Assessment/Plan:   Acute Biliary pancreatitis: General Surgery was consulted recommended lap chole during this admission to prevent recurrence of her pancreatitis once her pancreatitis is resolved. Allow clear liquid diet pain control.  On IV fluids and IV narcotics. Continue bowel regimen  Essential hypertension Continue metoprolol  blood pressure is well-controlled.  Hyperlipidemia Continue statin therapy.  PAD: Continue aspirin  and statins.  Tobacco abuse: She has been counseled.  Ascending stage IIa colon cancer: Status post right colectomy.  Moderate protein caloric malnutrition: Noted.   DVT prophylaxis: lovenox  Family Communication:none Status is: Inpatient Remains inpatient appropriate because: Biliary pancreatitis    Code Status:     Code Status Orders  (From admission, onward)           Start     Ordered   10/04/23 1626  Full code  Continuous       Question:  By:  Answer:  Other   10/04/23 1625           Code Status History     Date Active Date Inactive Code Status Order ID Comments User Context   10/21/2020 1321 10/23/2020 1648 Full Code [id number]  Debby Hila, MD Inpatient   09/08/2020 1827 09/11/2020 1642 Full Code [id number]  Tobie Yetta HERO, MD ED   09/08/2020 1815  09/08/2020 1827 Full Code [id number]  Tobie Yetta HERO, MD ED      Advance Directive Documentation    Flowsheet Row Most Recent Value  Type of Advance Directive Living will, Healthcare Power of Attorney  Pre-existing out of facility DNR order (yellow form or pink MOST form) --  MOST Form in Place? --      IV Access:   Peripheral IV   Procedures and diagnostic studies:   MR ABDOMEN MRCP W WO CONTAST Result Date: 10/04/2023 CLINICAL DATA:  Sudden onset epigastric pain. Nausea and vomiting. Pancreatitis on CT. EXAM: MRI ABDOMEN WITHOUT AND WITH CONTRAST (INCLUDING MRCP) TECHNIQUE: Multiplanar multisequence MR imaging of the abdomen was performed both before and after the administration of intravenous contrast. Heavily T2-weighted images of the biliary and pancreatic ducts were obtained, and three-dimensional MRCP images were rendered by post processing. CONTRAST:  5mL GADAVIST  GADOBUTROL  1 MMOL/ML IV SOLN COMPARISON:  CT same day FINDINGS: Lower chest:  Lung bases are clear. Hepatobiliary: No intrahepatic biliary duct dilatation. The common bile duct is dilated as well as the common hepatic duct. No filling defects in the common bile duct/choledocholithiasis. The common hepatic duct measures 10 mm while the common bile duct measures 7 mm. There stones versus sludge within the fundus of the gallbladder (image 23/3). No gallbladder distension or pericholecystic fluid. Pancreas: There is mild edema within the pancreas. The pancreatic duct is normal caliber. Small amount fluid in the retroperitoneum adjacent pancreatic body and extending into the retroperitoneum inferiorly. No organized collections.  No variant pancreatic ductal anatomy. Spleen: Normal spleen. Adrenals/urinary tract: Adrenal glands and kidneys are normal. Stomach/Bowel: Stomach and limited of the small bowel is unremarkable Vascular/Lymphatic: Abdominal aortic normal caliber. No retroperitoneal periportal lymphadenopathy.  Musculoskeletal: No aggressive osseous lesion IMPRESSION: 1. Mild acute pancreatitis. No organized collections. No variant ductal anatomy. 2. Moderate dilatation of the common bile duct and common hepatic duct. No choledocholithiasis. 3. Stones versus sludge in the gallbladder. No evidence of acute cholecystitis. Electronically Signed   By: Jackquline Boxer M.D.   On: 10/04/2023 18:38   MR 3D Recon At Scanner Result Date: 10/04/2023 CLINICAL DATA:  Sudden onset epigastric pain. Nausea and vomiting. Pancreatitis on CT. EXAM: MRI ABDOMEN WITHOUT AND WITH CONTRAST (INCLUDING MRCP) TECHNIQUE: Multiplanar multisequence MR imaging of the abdomen was performed both before and after the administration of intravenous contrast. Heavily T2-weighted images of the biliary and pancreatic ducts were obtained, and three-dimensional MRCP images were rendered by post processing. CONTRAST:  5mL GADAVIST  GADOBUTROL  1 MMOL/ML IV SOLN COMPARISON:  CT same day FINDINGS: Lower chest:  Lung bases are clear. Hepatobiliary: No intrahepatic biliary duct dilatation. The common bile duct is dilated as well as the common hepatic duct. No filling defects in the common bile duct/choledocholithiasis. The common hepatic duct measures 10 mm while the common bile duct measures 7 mm. There stones versus sludge within the fundus of the gallbladder (image 23/3). No gallbladder distension or pericholecystic fluid. Pancreas: There is mild edema within the pancreas. The pancreatic duct is normal caliber. Small amount fluid in the retroperitoneum adjacent pancreatic body and extending into the retroperitoneum inferiorly. No organized collections. No variant pancreatic ductal anatomy. Spleen: Normal spleen. Adrenals/urinary tract: Adrenal glands and kidneys are normal. Stomach/Bowel: Stomach and limited of the small bowel is unremarkable Vascular/Lymphatic: Abdominal aortic normal caliber. No retroperitoneal periportal lymphadenopathy. Musculoskeletal: No  aggressive osseous lesion IMPRESSION: 1. Mild acute pancreatitis. No organized collections. No variant ductal anatomy. 2. Moderate dilatation of the common bile duct and common hepatic duct. No choledocholithiasis. 3. Stones versus sludge in the gallbladder. No evidence of acute cholecystitis. Electronically Signed   By: Jackquline Boxer M.D.   On: 10/04/2023 18:38   US  Abdomen Limited RUQ (LIVER/GB) Result Date: 10/04/2023 CLINICAL DATA:  28045 Choledocholithiasis 28045 EXAM: ULTRASOUND ABDOMEN LIMITED RIGHT UPPER QUADRANT COMPARISON:  CT scan abdomen and pelvis from earlier the same day. FINDINGS: Gallbladder: No gallstones or wall thickening visualized. Probable small amount of sludge noted. No sonographic Murphy sign noted by sonographer. Common bile duct: Diameter: Dilated measuring up to 9 mm in the proximal portion and up to 10 mm in the midportion. However, distal CBD is not visualized due to obscuration by overlying bowel gas. Liver: No focal lesion identified. Within normal limits in parenchymal echogenicity. Portal vein is patent on color Doppler imaging with normal direction of blood flow towards the liver. Other: None. IMPRESSION: 1. Dilated common bile duct measuring up to 10 mm. Distal CBD is not visualized due to obscuration by overlying bowel gas. If there is clinical concern for distal CBD obstruction, further evaluation with MRI abdomen/MRCP or ERCP is recommended. 2. Gallbladder sludge. No cholelithiasis or sonographic evidence of acute cholecystitis. Electronically Signed   By: Ree Molt M.D.   On: 10/04/2023 15:41   CT ABDOMEN PELVIS W CONTRAST Result Date: 10/04/2023 CLINICAL DATA:  Epigastric abdominal pain. EXAM: CT ABDOMEN AND PELVIS WITH CONTRAST TECHNIQUE: Multidetector CT imaging of the abdomen and pelvis was performed using the standard protocol following bolus administration of  intravenous contrast. RADIATION DOSE REDUCTION: This exam was performed according to the  departmental dose-optimization program which includes automated exposure control, adjustment of the mA and/or kV according to patient size and/or use of iterative reconstruction technique. CONTRAST:  75mL OMNIPAQUE  IOHEXOL  350 MG/ML SOLN COMPARISON:  CT dated 08/28/2020. FINDINGS: Lower chest: The visualized lung bases are clear. No intra-abdominal free air or free fluid. Hepatobiliary: The liver is unremarkable. There is mild biliary dilatation. The gallbladder is distended. Layering small stones noted within the gallbladder. No pericholecystic fluid or dense of acute cholecystitis by CT. The common bile duct is mildly dilated measuring 13 mm in diameter. This is new since the prior CT. MRCP may provide better evaluation. Pancreas: There is inflammatory changes and edema surrounding the pancreas consistent with acute pancreatitis. No abscess or pseudocyst. Spleen: Normal in size without focal abnormality. Adrenals/Urinary Tract: The adrenal glands are unremarkable. There is no hydronephrosis on either side. There is symmetric enhancement and excretion of contrast by both kidneys. The visualized ureters and urinary bladder appear unremarkable. Stomach/Bowel: Postsurgical changes of the bowel with ileocolic anastomosis in the right upper abdomen. There is no bowel obstruction or active inflammation. Appendectomy. Vascular/Lymphatic: Advanced calcified and noncalcified plaque of the abdominal aorta. Atrophic distal native aorta. Bypass graft again noted. The iliac arteries remain patent. A femorofemoral bypass graft appears thrombosed. The IVC is unremarkable. No portal venous gas. There is no adenopathy. Reproductive: The uterus is grossly unremarkable no suspicious adnexal masses Other: None Musculoskeletal: No acute or significant osseous findings. IMPRESSION: 1. Acute pancreatitis. No abscess or pseudocyst. 2. Cholelithiasis. Mild biliary dilatation. MRCP may provide better evaluation. 3. Postsurgical changes of  the bowel with ileocolic anastomosis in the right upper abdomen. No bowel obstruction. 4.  Aortic Atherosclerosis (ICD10-I70.0). Electronically Signed   By: Vanetta Chou M.D.   On: 10/04/2023 13:35   DG Chest 1 View Result Date: 10/04/2023 CLINICAL DATA:  885157 Epigastric pain 114842 EXAM: CHEST  1 VIEW COMPARISON:  Jul 28, 2021 FINDINGS: No focal airspace consolidation, pleural effusion, or pneumothorax. No cardiomegaly. No acute fracture or destructive lesion. Osteopenia. Multilevel thoracic osteophytosis. IMPRESSION: No acute cardiopulmonary abnormality. Electronically Signed   By: Rogelia Myers M.D.   On: 10/04/2023 12:52     Medical Consultants:   None.   Subjective:    Alejandra Ford no pain tolerating water    Objective:    Vitals:   10/05/23 1604 10/05/23 1951 10/05/23 2039 10/06/23 0417  BP: (!) 122/53 (!) 113/49 (!) 113/49 106/65  Pulse: 73 79 79 81  Resp: 18 18  18   Temp: 99.3 F (37.4 C) 99 F (37.2 C)  98.3 F (36.8 C)  TempSrc: Oral Oral  Oral  SpO2: 90% 91%  (!) 88%   SpO2: (!) 88 %   Intake/Output Summary (Last 24 hours) at 10/06/2023 0749 Last data filed at 10/05/2023 2049 Gross per 24 hour  Intake 1491.38 ml  Output --  Net 1491.38 ml   There were no vitals filed for this visit.  Exam: General exam: In no acute distress. Respiratory system: Good air movement and clear to auscultation. Cardiovascular system: S1 & S2 heard, RRR. No JVD. Gastrointestinal system: Abdomen is nondistended, soft and nontender.  Extremities: No pedal edema. Skin: No rashes, lesions or ulcers Psychiatry: Judgement and insight appear normal. Mood & affect appropriate.  Data Reviewed:    Labs: Basic Metabolic Panel: Recent Labs  Lab 10/04/23 1215 10/04/23 1231 10/05/23 0556 10/06/23 0459  NA 137 138  137 136  K 4.1 3.3* 4.3 3.6  CL 107 109 109 109  CO2 18*  --  22 18*  GLUCOSE 178* 166* 97 62*  BUN 18 17 17 14   CREATININE 0.94 0.70 0.90 0.81  CALCIUM   9.1  --  8.5* 8.4*   GFR CrCl cannot be calculated (Unknown ideal weight.). Liver Function Tests: Recent Labs  Lab 10/04/23 1215 10/05/23 1021 10/06/23 0459  AST 36 29 29  ALT 20 19 16   ALKPHOS 87 64 54  BILITOT 0.8 0.8 1.3*  PROT 6.1* 5.2* 5.2*  ALBUMIN  3.5 2.8* 2.8*   Recent Labs  Lab 10/04/23 1215 10/05/23 1021 10/06/23 0459  LIPASE 1,548* 289* 62*   No results for input(s): AMMONIA in the last 168 hours. Coagulation profile No results for input(s): INR, PROTIME in the last 168 hours. COVID-19 Labs  No results for input(s): DDIMER, FERRITIN, LDH, CRP in the last 72 hours.  Lab Results  Component Value Date   SARSCOV2NAA RESULT: NEGATIVE 10/19/2020   SARSCOV2NAA NEGATIVE 09/08/2020    CBC: Recent Labs  Lab 10/04/23 1215 10/04/23 1231 10/05/23 0556  WBC 12.7*  --  11.6*  NEUTROABS 8.5*  --   --   HGB 14.1 13.3 12.8  HCT 43.8 39.0 37.9  MCV 98.2  --  95.7  PLT 218  --  154   Cardiac Enzymes: No results for input(s): CKTOTAL, CKMB, CKMBINDEX, TROPONINI in the last 168 hours. BNP (last 3 results) No results for input(s): PROBNP in the last 8760 hours. CBG: No results for input(s): GLUCAP in the last 168 hours. D-Dimer: No results for input(s): DDIMER in the last 72 hours. Hgb A1c: No results for input(s): HGBA1C in the last 72 hours. Lipid Profile: Recent Labs    10/04/23 1940  TRIG 27   Thyroid function studies: No results for input(s): TSH, T4TOTAL, T3FREE, THYROIDAB in the last 72 hours.  Invalid input(s): FREET3 Anemia work up: No results for input(s): VITAMINB12, FOLATE, FERRITIN, TIBC, IRON, RETICCTPCT in the last 72 hours. Sepsis Labs: Recent Labs  Lab 10/04/23 1215 10/05/23 0556  WBC 12.7* 11.6*   Microbiology No results found for this or any previous visit (from the past 240 hours).   Medications:    aspirin  EC  81 mg Oral Daily   atorvastatin   80 mg Oral QPM    cholecalciferol   2,000 Units Oral QHS   enoxaparin  (LOVENOX ) injection  40 mg Subcutaneous Q24H   metoprolol  tartrate  12.5 mg Oral BID   pantoprazole  (PROTONIX ) IV  40 mg Intravenous Q24H   polyethylene glycol  17 g Oral BID   Continuous Infusions:  potassium chloride         LOS: 2 days   Erle Odell Castor  [facility]  10/06/2023, 7:49 AM

## 2023-10-07 ENCOUNTER — Other Ambulatory Visit: Payer: Self-pay

## 2023-10-07 ENCOUNTER — Encounter (HOSPITAL_COMMUNITY): Payer: Self-pay | Admitting: Internal Medicine

## 2023-10-07 ENCOUNTER — Inpatient Hospital Stay (HOSPITAL_COMMUNITY): Payer: Self-pay | Admitting: Anesthesiology

## 2023-10-07 ENCOUNTER — Encounter (HOSPITAL_COMMUNITY)
Admission: EM | Disposition: A | Discharge status: Home / Self Care | Payer: Self-pay | Source: Home / Self Care | Attending: Internal Medicine

## 2023-10-07 DIAGNOSIS — I10 Essential (primary) hypertension: Secondary | ICD-10-CM | POA: Diagnosis not present

## 2023-10-07 DIAGNOSIS — R109 Unspecified abdominal pain: Secondary | ICD-10-CM | POA: Diagnosis not present

## 2023-10-07 DIAGNOSIS — K851 Biliary acute pancreatitis without necrosis or infection: Secondary | ICD-10-CM | POA: Diagnosis not present

## 2023-10-07 DIAGNOSIS — I252 Old myocardial infarction: Secondary | ICD-10-CM

## 2023-10-07 DIAGNOSIS — K859 Acute pancreatitis without necrosis or infection, unspecified: Secondary | ICD-10-CM | POA: Diagnosis not present

## 2023-10-07 DIAGNOSIS — K838 Other specified diseases of biliary tract: Secondary | ICD-10-CM | POA: Diagnosis not present

## 2023-10-07 DIAGNOSIS — I2510 Atherosclerotic heart disease of native coronary artery without angina pectoris: Secondary | ICD-10-CM

## 2023-10-07 HISTORY — PX: CHOLECYSTECTOMY: SHX55

## 2023-10-07 HISTORY — PX: INDOCYANINE GREEN FLUORESCENCE IMAGING (ICG): SHX7595

## 2023-10-07 LAB — BASIC METABOLIC PANEL WITH GFR
Anion gap: 10 (ref 5–15)
BUN: 14 mg/dL (ref 8–23)
CO2: 18 mmol/L — ABNORMAL LOW (ref 22–32)
Calcium: 8.2 mg/dL — ABNORMAL LOW (ref 8.9–10.3)
Chloride: 105 mmol/L (ref 98–111)
Creatinine, Ser: 0.77 mg/dL (ref 0.44–1.00)
GFR, Estimated: >60 mL/min (ref >60)
Glucose, Bld: 72 mg/dL (ref 70–99)
Potassium: 3.5 mmol/L (ref 3.5–5.1)
Sodium: 133 mmol/L — ABNORMAL LOW (ref 135–145)

## 2023-10-07 LAB — HEPATIC FUNCTION PANEL
ALT: 16 U/L (ref 0–44)
AST: 32 U/L (ref 15–41)
Albumin: 2.8 g/dL — ABNORMAL LOW (ref 3.5–5.0)
Alkaline Phosphatase: 56 U/L (ref 38–126)
Bilirubin, Direct: 0.4 mg/dL — ABNORMAL HIGH (ref 0.0–0.2)
Indirect Bilirubin: 1.5 mg/dL — ABNORMAL HIGH (ref 0.3–0.9)
Total Bilirubin: 1.9 mg/dL — ABNORMAL HIGH (ref 0.0–1.2)
Total Protein: 5.3 g/dL — ABNORMAL LOW (ref 6.5–8.1)

## 2023-10-07 LAB — CBC
HCT: 32.9 % — ABNORMAL LOW (ref 36.0–46.0)
Hemoglobin: 11.2 g/dL — ABNORMAL LOW (ref 12.0–15.0)
MCH: 32.3 pg (ref 26.0–34.0)
MCHC: 34 g/dL (ref 30.0–36.0)
MCV: 94.8 fL (ref 80.0–100.0)
Platelets: 122 K/uL — ABNORMAL LOW (ref 150–400)
RBC: 3.47 MIL/uL — ABNORMAL LOW (ref 3.87–5.11)
RDW: 13.9 % (ref 11.5–15.5)
WBC: 12.3 K/uL — ABNORMAL HIGH (ref 4.0–10.5)
nRBC: 0 % (ref 0.0–0.2)

## 2023-10-07 LAB — LIPASE, BLOOD: Lipase: 40 U/L (ref 11–51)

## 2023-10-07 SURGERY — LAPAROSCOPIC CHOLECYSTECTOMY
Anesthesia: General

## 2023-10-07 MED ORDER — SODIUM CHLORIDE 0.9 % IR SOLN
Status: DC | PRN
  Administered 2023-10-07: 150 mL

## 2023-10-07 MED ORDER — CHLORHEXIDINE GLUCONATE 0.12 % MT SOLN
OROMUCOSAL | Status: AC
Start: 2023-10-07 — End: 2023-10-07
  Filled 2023-10-07: qty 15

## 2023-10-07 MED ORDER — PROPOFOL 10 MG/ML IV BOLUS
INTRAVENOUS | Status: AC
  Filled 2023-10-07: qty 20

## 2023-10-07 MED ORDER — CHLORHEXIDINE GLUCONATE CLOTH 2 % EX PADS
6.0000 | MEDICATED_PAD | Freq: Once | CUTANEOUS | Status: AC
  Administered 2023-10-07: 6 via TOPICAL

## 2023-10-07 MED ORDER — ROCURONIUM BROMIDE 10 MG/ML (PF) SYRINGE
PREFILLED_SYRINGE | INTRAVENOUS | Status: AC
  Filled 2023-10-07: qty 10

## 2023-10-07 MED ORDER — FENTANYL CITRATE (PF) 250 MCG/5ML IJ SOLN
INTRAMUSCULAR | Status: AC
  Filled 2023-10-07: qty 5

## 2023-10-07 MED ORDER — SUCCINYLCHOLINE CHLORIDE 200 MG/10ML IV SOSY
PREFILLED_SYRINGE | INTRAVENOUS | Status: AC
  Filled 2023-10-07: qty 10

## 2023-10-07 MED ORDER — LIDOCAINE 2% (20 MG/ML) 5 ML SYRINGE
INTRAMUSCULAR | Status: DC | PRN
  Administered 2023-10-07: 40 mg via INTRAVENOUS

## 2023-10-07 MED ORDER — ORAL CARE MOUTH RINSE: 15.0000 mL | Freq: Once | OROMUCOSAL | Status: AC

## 2023-10-07 MED ORDER — TRAMADOL HCL 50 MG PO TABS: 50.0000 mg | ORAL_TABLET | Freq: Four times a day (QID) | ORAL | Status: DC | PRN

## 2023-10-07 MED ORDER — PROPOFOL 10 MG/ML IV BOLUS
INTRAVENOUS | Status: DC | PRN
  Administered 2023-10-07: 80 mg via INTRAVENOUS

## 2023-10-07 MED ORDER — ONDANSETRON HCL 4 MG/2ML IJ SOLN
INTRAMUSCULAR | Status: AC
  Filled 2023-10-07: qty 2

## 2023-10-07 MED ORDER — LACTATED RINGERS IV SOLN: INTRAVENOUS | Status: DC | PRN

## 2023-10-07 MED ORDER — SUGAMMADEX SODIUM 200 MG/2ML IV SOLN
INTRAVENOUS | Status: DC | PRN
  Administered 2023-10-07: 90 mg via INTRAVENOUS
  Administered 2023-10-07: 10 mg via INTRAVENOUS

## 2023-10-07 MED ORDER — DEXAMETHASONE SODIUM PHOSPHATE 10 MG/ML IJ SOLN
INTRAMUSCULAR | Status: DC | PRN
  Administered 2023-10-07: 4 mg via INTRAVENOUS

## 2023-10-07 MED ORDER — CEFAZOLIN SODIUM-DEXTROSE 2-4 GM/100ML-% IV SOLN
2.0000 g | INTRAVENOUS | Status: AC
  Administered 2023-10-07: 2 g via INTRAVENOUS

## 2023-10-07 MED ORDER — CHLORHEXIDINE GLUCONATE 0.12 % MT SOLN
15.0000 mL | Freq: Once | OROMUCOSAL | Status: AC
  Administered 2023-10-07: 15 mL via OROMUCOSAL

## 2023-10-07 MED ORDER — BUPIVACAINE-EPINEPHRINE 0.25% -1:200000 IJ SOLN
INTRAMUSCULAR | Status: DC | PRN
  Administered 2023-10-07: 10 mL

## 2023-10-07 MED ORDER — DEXAMETHASONE SODIUM PHOSPHATE 10 MG/ML IJ SOLN
INTRAMUSCULAR | Status: AC
  Filled 2023-10-07: qty 1

## 2023-10-07 MED ORDER — BUPIVACAINE-EPINEPHRINE (PF) 0.25% -1:200000 IJ SOLN
INTRAMUSCULAR | Status: AC
  Filled 2023-10-07: qty 30

## 2023-10-07 MED ORDER — 0.9 % SODIUM CHLORIDE (POUR BTL) OPTIME
TOPICAL | Status: DC | PRN
  Administered 2023-10-07: 1000 mL

## 2023-10-07 MED ORDER — MIDAZOLAM HCL 2 MG/2ML IJ SOLN
INTRAMUSCULAR | Status: AC
  Filled 2023-10-07: qty 2

## 2023-10-07 MED ORDER — ROCURONIUM BROMIDE 10 MG/ML (PF) SYRINGE
PREFILLED_SYRINGE | INTRAVENOUS | Status: DC | PRN
  Administered 2023-10-07: 10 mg via INTRAVENOUS
  Administered 2023-10-07: 50 mg via INTRAVENOUS

## 2023-10-07 MED ORDER — LACTATED RINGERS IV SOLN: INTRAVENOUS | Status: DC

## 2023-10-07 MED ORDER — FENTANYL CITRATE (PF) 250 MCG/5ML IJ SOLN
INTRAMUSCULAR | Status: DC | PRN
  Administered 2023-10-07 (×4): 50 ug via INTRAVENOUS

## 2023-10-07 MED ORDER — PHENYLEPHRINE 80 MCG/ML (10ML) SYRINGE FOR IV PUSH (FOR BLOOD PRESSURE SUPPORT)
PREFILLED_SYRINGE | INTRAVENOUS | Status: DC | PRN
  Administered 2023-10-07 (×3): 80 ug via INTRAVENOUS

## 2023-10-07 MED ORDER — CEFAZOLIN SODIUM-DEXTROSE 2-4 GM/100ML-% IV SOLN
INTRAVENOUS | Status: AC
  Filled 2023-10-07: qty 100

## 2023-10-07 MED ORDER — LIDOCAINE 2% (20 MG/ML) 5 ML SYRINGE
INTRAMUSCULAR | Status: AC
  Filled 2023-10-07: qty 5

## 2023-10-07 MED ORDER — ONDANSETRON HCL 4 MG/2ML IJ SOLN
INTRAMUSCULAR | Status: DC | PRN
  Administered 2023-10-07: 4 mg via INTRAVENOUS

## 2023-10-07 MED ORDER — INDOCYANINE GREEN 25 MG IV SOLR
7.5000 mg | Freq: Once | INTRAVENOUS | Status: DC
  Filled 2023-10-07: qty 10

## 2023-10-07 SURGICAL SUPPLY — 28 items
CANISTER SUCTION 3000ML PPV (SUCTIONS) ×2 IMPLANT
CHLORAPREP W/TINT 26 (MISCELLANEOUS) ×2 IMPLANT
CLIP APPLIE 5 13 M/L LIGAMAX5 (MISCELLANEOUS) ×2 IMPLANT
COVER SURGICAL LIGHT HANDLE (MISCELLANEOUS) ×2 IMPLANT
DERMABOND ADVANCED .7 DNX12 (GAUZE/BANDAGES/DRESSINGS) ×2 IMPLANT
DISSECTOR BLUNT TIP ENDO 5MM (MISCELLANEOUS) IMPLANT
ELECTRODE REM PT RTRN 9FT ADLT (ELECTROSURGICAL) ×2 IMPLANT
GLOVE BIO SURGEON STRL SZ7.5 (GLOVE) ×2 IMPLANT
GLOVE INDICATOR 8.0 STRL GRN (GLOVE) ×2 IMPLANT
GOWN STRL REUS W/ TWL LRG LVL3 (GOWN DISPOSABLE) ×4 IMPLANT
GOWN STRL REUS W/ TWL XL LVL3 (GOWN DISPOSABLE) ×2 IMPLANT
IRRIGATION SUCT STRKRFLW 2 WTP (MISCELLANEOUS) ×2 IMPLANT
KIT BASIN OR (CUSTOM PROCEDURE TRAY) ×2 IMPLANT
KIT TURNOVER KIT B (KITS) ×2 IMPLANT
NS IRRIG 1000ML POUR BTL (IV SOLUTION) ×2 IMPLANT
PAD ARMBOARD POSITIONER FOAM (MISCELLANEOUS) ×2 IMPLANT
PENCIL BUTTON HOLSTER BLD 10FT (ELECTRODE) ×2 IMPLANT
SCISSORS LAP 5X35 DISP (ENDOMECHANICALS) ×2 IMPLANT
SET TUBE SMOKE EVAC HIGH FLOW (TUBING) ×2 IMPLANT
SLEEVE ADV FIXATION 5X100MM (TROCAR) ×4 IMPLANT
SUT MNCRL AB 4-0 PS2 18 (SUTURE) ×2 IMPLANT
SYSTEM BAG RETRIEVAL 10MM (BASKET) IMPLANT
TOWEL GREEN STERILE FF (TOWEL DISPOSABLE) ×2 IMPLANT
TRAY LAPAROSCOPIC MC (CUSTOM PROCEDURE TRAY) ×2 IMPLANT
TROCAR ADV FIXATION 5X100MM (TROCAR) ×2 IMPLANT
TROCAR BALLN 12MMX100 BLUNT (TROCAR) ×2 IMPLANT
WARMER LAPAROSCOPE (MISCELLANEOUS) ×2 IMPLANT
WATER STERILE IRR 1000ML POUR (IV SOLUTION) ×2 IMPLANT

## 2023-10-07 NOTE — Anesthesia Postprocedure Evaluation (Signed)
 Anesthesia Post Note  Patient: Alejandra Ford  Procedure(s) Performed: LAPAROSCOPIC CHOLECYSTECTOMY INDOCYANINE GREEN  FLUORESCENCE IMAGING (ICG)     Patient location during evaluation: PACU Anesthesia Type: General Level of consciousness: awake and alert, oriented and patient cooperative Pain management: pain level controlled Vital Signs Assessment: post-procedure vital signs reviewed and stable Respiratory status: spontaneous breathing, nonlabored ventilation and respiratory function stable Cardiovascular status: blood pressure returned to baseline and stable Postop Assessment: no apparent nausea or vomiting Anesthetic complications: no   No notable events documented.  Last Vitals:  Vitals:   10/07/23 1015 10/07/23 1030  BP: (!) 119/48   Pulse: 69 74  Resp: 12 19  Temp:    SpO2: 93% 93%    Last Pain:  Vitals:   10/07/23 1030  TempSrc:   PainSc: 0-No pain                 Aldahir Litaker,E. Mauricia Mertens

## 2023-10-07 NOTE — Transfer of Care (Addendum)
 Immediate Anesthesia Transfer of Care Note  Patient: Alejandra Ford  Procedure(s) Performed: LAPAROSCOPIC CHOLECYSTECTOMY INDOCYANINE GREEN  FLUORESCENCE IMAGING (ICG)  Patient Location: PACU  Anesthesia Type:General  Level of Consciousness: awake, alert , oriented, and patient cooperative  Airway & Oxygen Therapy: Patient Spontanous Breathing and Patient connected to nasal cannula oxygen  Post-op Assessment: Report given to RN and Post -op Vital signs reviewed and stable  Post vital signs: Reviewed and stable  Last Vitals:  Vitals Value Taken Time  BP 117/66 10/07/23 10:00  Temp    Pulse 81 10/07/23 10:01  Resp 15 10/07/23 10:01  SpO2 90 % 10/07/23 10:01  Vitals shown include unfiled device data.  Last Pain:  Vitals:   10/07/23 0737  TempSrc: Oral  PainSc:       Patients Stated Pain Goal: 0 (10/05/23 0750)  Complications: No notable events documented. Sat on [place] increased to 92%. Patient awake and answering questions, sleeping in between. Pre-op sat 92% RA

## 2023-10-07 NOTE — Progress Notes (Addendum)
 Subjective No acute events. No n/v. Pain ~resolved, now a mild dull ache; previously has been severe hot MEG pain.   Objective: Vital signs in last 24 hours: Temp:  [98 F (36.7 C)-99 F (37.2 C)] 98 F (36.7 C) (08/02 0436) Pulse Rate:  [66-81] 81 (08/02 0436) Resp:  [16-17] 16 (08/02 0436) BP: (109-121)/(52-59) 121/52 (08/02 0436) SpO2:  [92 %-98 %] 94 % (08/02 0436) Weight:  [49.7 kg] 49.7 kg (08/02 0733) Last BM Date : 10/06/23  Intake/Output from previous day: No intake/output data recorded. Intake/Output this shift: No intake/output data recorded.  Gen: NAD, comfortable CV: RRR Pulm: Normal work of breathing Abd: Soft, NT/ND. 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 Has since had robotic R colectomy. Ext: SCDs in place  Lab Results: CBC  Recent Labs    10/05/23 0556 10/07/23 0456  WBC 11.6* 12.3*  HGB 12.8 11.2*  HCT 37.9 32.9*  PLT 154 122*   BMET Recent Labs    10/06/23 0459 10/07/23 0456  NA 136 133*  K 3.6 3.5  CL 109 105  CO2 18* 18*  GLUCOSE 62* 72  BUN 14 14  CREATININE 0.81 0.77  CALCIUM  8.4* 8.2*   PT/INR No results for input(s): LABPROT, INR in the last 72 hours. ABG No results for input(s): PHART, HCO3 in the last 72 hours.  Invalid input(s): PCO2, PO2  Studies/Results:  Anti-infectives: Anti-infectives (From admission, onward)    Start     Dose/Rate Route Frequency Ordered Stop   10/07/23 0700  ceFAZolin  (ANCEF ) IVPB 2g/100 mL premix        2 g 200 mL/hr over 30 Minutes Intravenous On call to O.R. 10/07/23 0610 10/08/23 0559        Assessment/Plan: Patient Active Problem List   Diagnosis Date Noted   Pancreatitis 10/04/2023   Malnutrition of moderate degree 10/23/2020   Colon cancer, ascending (HCC) 10/21/2020   Protein-calorie malnutrition, severe 09/09/2020   NSTEMI (non-ST elevated myocardial infarction) (HCC) 09/08/2020   Essential hypertension 09/08/2020   Anemia  09/08/2020   Colonic mass 09/08/2020   Unintended weight loss 09/08/2020   Hyperlipidemia 09/08/2020   Hyponatremia 09/08/2020   Leucocytosis 09/08/2020   PAD (peripheral artery disease) (HCC) 09/08/2020   S/P femoral-popliteal bypass surgery 09/08/2020   H/O aorta-iliac-femoral bypass 09/08/2020   Smoker 09/08/2020   Biliary pancreatitis MRCP 10/04/23 showed no evident choledocholithiasis Tbili 1.9 although indirect bilirubin is 1.5. Lipase normalized; symptoms have ~resolved as well  -The anatomy and physiology of the hepatobiliary system was discussed with the patient. The pathophysiology of gallbladder disease was then reviewed as well. -The options for treatment were discussed including ongoing observation which may result in subsequent gallbladder complications (infection, pancreatitis, choledocholithiasis, etc), drainage procedures, and surgery - laparoscopic cholecystectomy -The planned procedure, material risks (including, but not limited to, pain, bleeding, infection, scarring, need for blood transfusion, damage to surrounding structures- blood vessels/nerves/viscus/organs, damage to bile duct, bile leak, chronic diarrhea, conversion to a 'subtotal' cholecystectomy and general expectations therein, post-cholecystectomy diarrhea, potential need for additional procedures including EGD/ERCP, hernia, worsening of pre-existing medical conditions, pancreatitis, pneumonia, heart attack, stroke, death) benefits and alternatives to surgery were discussed at length. We have noted a good probability that the procedure would help improve their symptoms. The patient's questions were answered to her and her husband's satisfaction, they voiced understanding and elected to proceed with surgery. Additionally, we discussed typical postoperative expectations and the recovery process.    LOS: 3 days  I spent a total of 50 minutes in both face-to-face and non-face-to-face activities, excluding procedures  performed, for this visit on the date of this encounter.  Check [email or web address] for General Surgery coverage night/weekend/holidays  No secure chat available for me given surgeries/clinic/off post call which would lead to a delay in care.    Lonni Pizza, MD [facility], [facility]

## 2023-10-07 NOTE — Progress Notes (Signed)
 [facility] PROGRESS NOTE    Progress Note  Alejandra Ford  FMW:[id number] DOB: [birth year] DOA: 10/04/2023 PCP: Chrystal Lamarr RAMAN, MD     Brief Narrative:   Alejandra Ford is an 78 y.o. female past medical history significant for arthritis hyperlipidemia peripheral vascular disease who came in with abdominal pain lipase was 1600 white blood cell count of 12, CT scan of the abdomen showed acute pancreatitis no abscess mild biliary dilation.  Abdominal ultrasound shows dilated common bile duct greater than 10 mm.  MRCP showed moderate dilated common bile duct stone and hepatic duct no stone appreciated, but it did showed gallbladder stones and sludge GI was consulted.  Assessment/Plan:   Acute Biliary pancreatitis: General Surgery was consulted recommended lap chole on 10/07/2023. Currently n.p.o. on IV fluids and IV narcotics. Continue bowel regimen  Essential hypertension Continue metoprolol  blood pressure is well-controlled.  Hyperlipidemia Continue statin therapy.  PAD: Continue aspirin  and statins.  Tobacco abuse: She has been counseled.  Ascending stage IIa colon cancer: Status post right colectomy.  Moderate protein caloric malnutrition: Noted.   DVT prophylaxis: lovenox  Family Communication:none Status is: Inpatient Remains inpatient appropriate because: Biliary pancreatitis    Code Status:     Code Status Orders  (From admission, onward)           Start     Ordered   10/04/23 1626  Full code  Continuous       Question:  By:  Answer:  Other   10/04/23 1625           Code Status History     Date Active Date Inactive Code Status Order ID Comments User Context   10/21/2020 1321 10/23/2020 1648 Full Code [id number]  Debby Hila, MD Inpatient   09/08/2020 1827 09/11/2020 1642 Full Code [id number]  Tobie Yetta HERO, MD ED   09/08/2020 1815 09/08/2020 1827 Full Code [id number]  Tobie Yetta HERO, MD ED      Advance Directive Documentation     Flowsheet Row Most Recent Value  Type of Advance Directive Living will, Healthcare Power of Attorney  Pre-existing out of facility DNR order (yellow form or pink MOST form) --  MOST Form in Place? --      IV Access:   Peripheral IV   Procedures and diagnostic studies:   No results found.    Medical Consultants:   None.   Subjective:    Alejandra Ford no complaints this morning  Objective:    Vitals:   10/06/23 2013 10/07/23 0436 10/07/23 0733 10/07/23 0737  BP: (!) 116/59 (!) 121/52  (!) 133/92  Pulse: 75 81  75  Resp:  16  16  Temp:  98 F (36.7 C)  98.4 F (36.9 C)  TempSrc:  Oral  Oral  SpO2:  94%  92%  Weight:   49.7 kg   Height:   5' 3 (1.6 m)    SpO2: 92 %  No intake or output data in the 24 hours ending 10/07/23 0743  Filed Weights   10/07/23 0733  Weight: 49.7 kg    Exam: General exam: In no acute distress. Respiratory system: Good air movement and clear to auscultation. Cardiovascular system: S1 & S2 heard, RRR. No JVD. Gastrointestinal system: Abdomen is nondistended, soft and nontender.  Extremities: No pedal edema. Skin: No rashes, lesions or ulcers Psychiatry: Judgement and insight appear normal. Mood & affect appropriate.  Data Reviewed:    Labs: Basic Metabolic Panel: Recent Labs  Lab  10/04/23 1215 10/04/23 1231 10/05/23 0556 10/06/23 0459 10/07/23 0456  NA 137 138 137 136 133*  K 4.1 3.3* 4.3 3.6 3.5  CL 107 109 109 109 105  CO2 18*  --  22 18* 18*  GLUCOSE 178* 166* 97 62* 72  BUN 18 17 17 14 14   CREATININE 0.94 0.70 0.90 0.81 0.77  CALCIUM  9.1  --  8.5* 8.4* 8.2*   GFR Estimated Creatinine Clearance: 46.9 mL/min (by C-G formula based on SCr of 0.77 mg/dL). Liver Function Tests: Recent Labs  Lab 10/04/23 1215 10/05/23 1021 10/06/23 0459 10/07/23 0456  AST 36 29 29 32  ALT 20 19 16 16   ALKPHOS 87 64 54 56  BILITOT 0.8 0.8 1.3* 1.9*  PROT 6.1* 5.2* 5.2* 5.3*  ALBUMIN  3.5 2.8* 2.8* 2.8*   Recent  Labs  Lab 10/04/23 1215 10/05/23 1021 10/06/23 0459 10/07/23 0456  LIPASE 1,548* 289* 62* 40   No results for input(s): AMMONIA in the last 168 hours. Coagulation profile No results for input(s): INR, PROTIME in the last 168 hours. COVID-19 Labs  No results for input(s): DDIMER, FERRITIN, LDH, CRP in the last 72 hours.  Lab Results  Component Value Date   SARSCOV2NAA RESULT: NEGATIVE 10/19/2020   SARSCOV2NAA NEGATIVE 09/08/2020    CBC: Recent Labs  Lab 10/04/23 1215 10/04/23 1231 10/05/23 0556 10/07/23 0456  WBC 12.7*  --  11.6* 12.3*  NEUTROABS 8.5*  --   --   --   HGB 14.1 13.3 12.8 11.2*  HCT 43.8 39.0 37.9 32.9*  MCV 98.2  --  95.7 94.8  PLT 218  --  154 122*   Cardiac Enzymes: No results for input(s): CKTOTAL, CKMB, CKMBINDEX, TROPONINI in the last 168 hours. BNP (last 3 results) No results for input(s): PROBNP in the last 8760 hours. CBG: No results for input(s): GLUCAP in the last 168 hours. D-Dimer: No results for input(s): DDIMER in the last 72 hours. Hgb A1c: No results for input(s): HGBA1C in the last 72 hours. Lipid Profile: Recent Labs    10/04/23 1940  TRIG 27   Thyroid function studies: No results for input(s): TSH, T4TOTAL, T3FREE, THYROIDAB in the last 72 hours.  Invalid input(s): FREET3 Anemia work up: No results for input(s): VITAMINB12, FOLATE, FERRITIN, TIBC, IRON, RETICCTPCT in the last 72 hours. Sepsis Labs: Recent Labs  Lab 10/04/23 1215 10/05/23 0556 10/07/23 0456  WBC 12.7* 11.6* 12.3*   Microbiology No results found for this or any previous visit (from the past 240 hours).   Medications:    [MAR Hold] aspirin  EC  81 mg Oral Daily   [MAR Hold] atorvastatin   80 mg Oral QPM   [MAR Hold] cholecalciferol   2,000 Units Oral QHS   [MAR Hold] enoxaparin  (LOVENOX ) injection  40 mg Subcutaneous Q24H   indocyanine green   2.5 mg Intravenous To OR   indocyanine green   7.5 mg  Intravenous Once   [MAR Hold] metoprolol  tartrate  12.5 mg Oral BID   [MAR Hold] pantoprazole  (PROTONIX ) IV  40 mg Intravenous Q24H   Continuous Infusions:   ceFAZolin  (ANCEF ) IV     lactated ringers  10 mL/hr at 10/07/23 0741   [MAR Hold] potassium chloride         LOS: 3 days   Alejandra Ford  [facility]  10/07/2023, 7:43 AM

## 2023-10-07 NOTE — Op Note (Signed)
 10/04/2023 - 10/07/2023 9:47 AM  PATIENT: Alejandra Ford  78 y.o. female  Patient Care Team: Chrystal Lamarr RAMAN, MD as PCP - General (Family Medicine) Lonni Slain, MD as PCP - Cardiology (Cardiology)  PRE-OPERATIVE DIAGNOSIS: Biliary/gallstone pancreatitis  POST-OPERATIVE DIAGNOSIS: Same  PROCEDURE: Laparoscopic cholecystectomy with indocyanine green  cholangiography  SURGEON: Lonni Pizza, MD  ASSISTANT: OR Staff  ANESTHESIA: General endotracheal  EBL: 10 mL  DRAINS: None  SPECIMEN: Gallbladder  COUNTS: Sponge, needle and instrument counts were reported correct x2 at the conclusion of the operation  DISPOSITION: PACU in satisfactory condition  COMPLICATIONS: none  FINDINGS: Omental adhesions to gallbladder but otherwise normal-appearing gallbladder wall.  ICG cholangiography demonstrates uptake by the liver and excretion into the biliary system.  Gallbladder fills nicely with ICG.  Duodenum is also noted to be opacified with ICG consistent with the biliary system that is patent.  Cholecystectomy carried out uneventfully.  DESCRIPTION:   The patient was identified & brought into the operating room. She was then positioned supine on the OR table. SCDs were in place and active during the entire case. She then underwent general endotracheal anesthesia. Pressure points were padded. Hair on the abdomen was clipped by the OR team. The abdomen was prepped and draped in the standard sterile fashion. Antibiotics were administered. A surgical timeout was performed and confirmed our plan.   A periumbilical incision was made. The umbilical stalk was grasped and retracted outwardly. The supraumbilical fascia was identified and incised. The peritoneal cavity was gently entered bluntly. A purse-string 0 Vicryl suture was placed. The Hasson cannula was inserted into the peritoneal cavity and insufflation with CO2 commenced to . A laparoscope was inserted into the peritoneal  cavity and inspection confirmed no evidence of trocar site complications. The patient was then positioned in reverse Trendelenburg with slight left side down. 3 additional 5 mm trocars were placed along the right subcostal line - one 5mm port in mid subcostal region, another 5mm port in the right flank near the anterior axillary line, and a third 5mm port in the left subxiphoid region obliquely near the falciform ligament.  The liver and gallbladder were inspected.  Scarring on the liver as noted by Dr. Debby years ago.  No evident peritoneal surface masses or liver masses.  Gallbladder has omental containing adhesions but is otherwise normal. The gallbladder fundus was grasped and elevated cephalad.  Omental adhesions were carefully taken down electrocautery.  An additional grasper was then placed on the infundibulum of the gallbladder and the infundibulum was retracted laterally. Staying high on the gallbladder, the peritoneum on both sides of the gallbladder was opened with hook cautery. Gentle blunt dissection was then employed with a [place]  dissector working down into [employer]. The cystic duct was identified and carefully circumferentially dissected. The cystic artery was also identified and carefully circumferentially dissected. The space between the cystic artery and hepatocystic plate was developed such that a good view of the liver could be seen through a window medial to the cystic artery. The triangle of Calot had been cleared of all fibrofatty tissue. At this point, a critical view of safety was achieved and the only structures visualized was the skeletonized cystic duct laterally, the skeletonized cystic artery and the liver through the window medial to the artery.  A small, diminutive posterior cystic artery is also noted.    Under near-infrared light, indocyanine green  cholangiography demonstrates uptake by the liver and excretion into the biliary system.  ICG activity is seen within  the candidate cystic duct filling the gallbladder.  There is no ICG activity seen within the cystic artery or its diminutive posterior division.  ICG activity is seen through the wall of the duodenum consistent with a patent biliary system.  The cystic duct and artery were clipped with 2 clips on the patient side and 1 clip on the specimen side. The cystic duct and artery were then divided.  Her diminutive posterior cystic artery was controlled with a clip as well.  The gallbladder was then freed from its remaining attachments to the liver using electrocautery and placed into an endocatch bag. The RUQ was gently irrigated with sterile saline. Hemostasis was then verified. The clips were in good position; the gallbladder fossa was dry. The rest of the abdomen was inspected no injury nor bleeding elsewhere was identified.  The endocatch bag containing the gallbladder was then removed from the umbilical port site and passed off as specimen. The RUQ ports were removed under direct visualization and noted to be hemostatic. The umbilical fascia was then closed using the 0 Vicryl purse-string suture. The fascia was palpated and noted to be completely closed. The skin of all incision sites was approximated with 4-0 monocryl subcuticular suture and dermabond applied. She was then awakened from anesthesia, extubated, and transferred to a stretcher for transport to PACU in satisfactory condition.

## 2023-10-07 NOTE — Progress Notes (Signed)
 CHG bath given, Jewelry off and given to husband who is currently at bedside, and teeth are brushed.    Consent form signed and placed in patients chart.

## 2023-10-07 NOTE — Anesthesia Procedure Notes (Signed)
 Procedure Name: Intubation Date/Time: 10/07/2023 8:44 AM  Performed by: Bonny Avelina Caldron, CRNAPre-anesthesia Checklist: Patient identified, Emergency Drugs available and Suction available Patient Re-evaluated:Patient Re-evaluated prior to induction Oxygen Delivery Method: Circle system utilized Preoxygenation: Pre-oxygenation with 100% oxygen Induction Type: IV induction and Cricoid Pressure applied Ventilation: Mask ventilation without difficulty Laryngoscope Size: Mac and 3 Grade View: Grade III Tube size: 7.0 mm Number of attempts: 1

## 2023-10-08 ENCOUNTER — Encounter (HOSPITAL_COMMUNITY): Payer: Self-pay | Admitting: Surgery

## 2023-10-08 DIAGNOSIS — K838 Other specified diseases of biliary tract: Secondary | ICD-10-CM | POA: Diagnosis not present

## 2023-10-08 DIAGNOSIS — K859 Acute pancreatitis without necrosis or infection, unspecified: Secondary | ICD-10-CM | POA: Diagnosis not present

## 2023-10-08 DIAGNOSIS — R109 Unspecified abdominal pain: Secondary | ICD-10-CM | POA: Diagnosis not present

## 2023-10-08 DIAGNOSIS — K851 Biliary acute pancreatitis without necrosis or infection: Secondary | ICD-10-CM | POA: Diagnosis not present

## 2023-10-08 LAB — HEPATIC FUNCTION PANEL
ALT: 17 U/L (ref 0–44)
AST: 36 U/L (ref 15–41)
Albumin: 2.5 g/dL — ABNORMAL LOW (ref 3.5–5.0)
Alkaline Phosphatase: 54 U/L (ref 38–126)
Bilirubin, Direct: 0.2 mg/dL (ref 0.0–0.2)
Indirect Bilirubin: 0.8 mg/dL (ref 0.3–0.9)
Total Bilirubin: 1 mg/dL (ref 0.0–1.2)
Total Protein: 5.4 g/dL — ABNORMAL LOW (ref 6.5–8.1)

## 2023-10-08 MED ORDER — POLYETHYLENE GLYCOL 3350 17 G PO PACK
17.0000 g | PACK | Freq: Two times a day (BID) | ORAL | Status: DC
  Administered 2023-10-08: 17 g via ORAL
  Filled 2023-10-08: qty 1

## 2023-10-08 MED ORDER — SORBITOL 70 % SOLN
30.0000 mL | Freq: Once | Status: DC
  Filled 2023-10-08: qty 30

## 2023-10-08 MED ORDER — ONDANSETRON 4 MG PO TBDP: 4.0000 mg | ORAL_TABLET | Freq: Three times a day (TID) | ORAL | 0 refills | Status: AC | PRN

## 2023-10-08 MED ORDER — POLYETHYLENE GLYCOL 3350 17 G PO PACK: 17.0000 g | PACK | Freq: Every day | ORAL | 0 refills | Status: AC | PRN

## 2023-10-08 MED ORDER — POLYETHYLENE GLYCOL 3350 17 G PO PACK: 17.0000 g | PACK | Freq: Every day | ORAL | Status: DC | PRN

## 2023-10-08 MED ORDER — TRAMADOL HCL 50 MG PO TABS: 50.0000 mg | ORAL_TABLET | Freq: Four times a day (QID) | ORAL | 0 refills | Status: AC | PRN

## 2023-10-08 NOTE — Discharge Summary (Addendum)
 Physician Discharge Summary  Alejandra Ford FMW:[id number] DOB: 06/23/1945 DOA: 10/04/2023  PCP: Chrystal Lamarr RAMAN, MD  Admit date: 10/04/2023 Discharge date: 10/08/2023  Admitted From: Home Disposition:  Home  Recommendations for Outpatient Follow-up:  Follow up with General Surgery in 1-2 weeks Please obtain BMP/CBC in one week   Home Health:No Equipment/Devices:None  Discharge Condition:Stable CODE STATUS:fUL Diet recommendation: Heart Healthy   Brief/Interim Summary: 78 y.o. female past medical history significant for arthritis hyperlipidemia peripheral vascular disease who came in with abdominal pain lipase was 1600 white blood cell count of 12, CT scan of the abdomen showed acute pancreatitis no abscess mild biliary dilation.  Abdominal ultrasound shows dilated common bile duct greater than 10 mm.  MRCP showed moderate dilated common bile duct stone and hepatic duct no stone appreciated, but it did showed gallbladder stones and sludge GI was consulted.   Discharge Diagnoses:  Principal Problem:   Pancreatitis Active Problems:   Essential hypertension   Hyperlipidemia   PAD (peripheral artery disease) (HCC)   Smoker   Colon cancer, ascending (HCC)   Malnutrition of moderate degree Acute biliary Mild chronic cholecystitis with cholelithiasis.  MRCP showed moderate dilated common bile duct and hepatic duct without stone. She was placed n.p.o. and IV fluids and IV narcotics and repleted and improved. General surgery was consulted she status post lap chole on 10/07/2023. She was able to tolerate her diet. She will follow-up with general surgery as an outpatient in 3 weeks.  Essential hypertension: No changes made to her medication continue current regimen blood pressure is well-controlled.  Hyperlipidemia: Continue statins.  PAD: Continue aspirin  and statins.  Tobacco abuse: She has been counseled.  Stage IIa colon cancer: Status post right colectomy follow-up with  oncology as an outpatient.  Moderate caloric malnutrition: noted   Discharge Instructions  Discharge Instructions     Diet - low sodium heart healthy   Complete by: As directed    Increase activity slowly   Complete by: As directed       Allergies as of 10/08/2023       Reactions   Oxycodone Nausea And Vomiting   Hallucinations/nightmares Hallucinations/nightmares   Trazodone  Anxiety   Nightmares        Medication List     TAKE these medications    acetaminophen  500 MG tablet Commonly known as: TYLENOL  Take 500 mg by mouth every 6 (six) hours as needed for moderate pain.   aspirin  EC 81 MG tablet Take 81 mg by mouth daily. Swallow whole.   atorvastatin  80 MG tablet Commonly known as: LIPITOR  Take 1 tablet (80 mg total) by mouth every evening.   ibuprofen 200 MG tablet Commonly known as: ADVIL Take 200 mg by mouth every 6 (six) hours as needed for moderate pain.   metoprolol  tartrate 25 MG tablet Commonly known as: LOPRESSOR  TAKE 1/2 TABLET BY MOUTH TWICE DAILY   nitroGLYCERIN  0.4 MG SL tablet Commonly known as: NITROSTAT  Place 1 tablet (0.4 mg total) under the tongue every 5 (five) minutes x 3 doses as needed for chest pain.   ondansetron  4 MG disintegrating tablet Commonly known as: ZOFRAN -ODT Take 1 tablet (4 mg total) by mouth every 8 (eight) hours as needed for nausea or vomiting.   Vitamin D  50 MCG (2000 UT) Caps Take 2,000 Units by mouth at bedtime.        Follow-up Information     Maczis, Puja Gosai, PA-C. Call.   Specialty: General Surgery Why: Our office is scheduling  a follow up appointment in 3-4 weeks, please call to confirm appointment date/time. Contact information: [address] [facility] [place] [address] [phone]                Allergies  Allergen Reactions   Oxycodone Nausea And Vomiting    Hallucinations/nightmares Hallucinations/nightmares    Trazodone  Anxiety     Nightmares    Consultations: Gastroenterology GI   Procedures/Studies: MR ABDOMEN MRCP W WO CONTAST Result Date: 10/04/2023 CLINICAL DATA:  Sudden onset epigastric pain. Nausea and vomiting. Pancreatitis on CT. EXAM: MRI ABDOMEN WITHOUT AND WITH CONTRAST (INCLUDING MRCP) TECHNIQUE: Multiplanar multisequence MR imaging of the abdomen was performed both before and after the administration of intravenous contrast. Heavily T2-weighted images of the biliary and pancreatic ducts were obtained, and three-dimensional MRCP images were rendered by post processing. CONTRAST:  5mL GADAVIST  GADOBUTROL  1 MMOL/ML IV SOLN COMPARISON:  CT same day FINDINGS: Lower chest:  Lung bases are clear. Hepatobiliary: No intrahepatic biliary duct dilatation. The common bile duct is dilated as well as the common hepatic duct. No filling defects in the common bile duct/choledocholithiasis. The common hepatic duct measures 10 mm while the common bile duct measures 7 mm. There stones versus sludge within the fundus of the gallbladder (image 23/3). No gallbladder distension or pericholecystic fluid. Pancreas: There is mild edema within the pancreas. The pancreatic duct is normal caliber. Small amount fluid in the retroperitoneum adjacent pancreatic body and extending into the retroperitoneum inferiorly. No organized collections. No variant pancreatic ductal anatomy. Spleen: Normal spleen. Adrenals/urinary tract: Adrenal glands and kidneys are normal. Stomach/Bowel: Stomach and limited of the small bowel is unremarkable Vascular/Lymphatic: Abdominal aortic normal caliber. No retroperitoneal periportal lymphadenopathy. Musculoskeletal: No aggressive osseous lesion IMPRESSION: 1. Mild acute pancreatitis. No organized collections. No variant ductal anatomy. 2. Moderate dilatation of the common bile duct and common hepatic duct. No choledocholithiasis. 3. Stones versus sludge in the gallbladder. No evidence of acute cholecystitis.  Electronically Signed   By: Jackquline Boxer M.D.   On: 10/04/2023 18:38   MR 3D Recon At Scanner Result Date: 10/04/2023 CLINICAL DATA:  Sudden onset epigastric pain. Nausea and vomiting. Pancreatitis on CT. EXAM: MRI ABDOMEN WITHOUT AND WITH CONTRAST (INCLUDING MRCP) TECHNIQUE: Multiplanar multisequence MR imaging of the abdomen was performed both before and after the administration of intravenous contrast. Heavily T2-weighted images of the biliary and pancreatic ducts were obtained, and three-dimensional MRCP images were rendered by post processing. CONTRAST:  5mL GADAVIST  GADOBUTROL  1 MMOL/ML IV SOLN COMPARISON:  CT same day FINDINGS: Lower chest:  Lung bases are clear. Hepatobiliary: No intrahepatic biliary duct dilatation. The common bile duct is dilated as well as the common hepatic duct. No filling defects in the common bile duct/choledocholithiasis. The common hepatic duct measures 10 mm while the common bile duct measures 7 mm. There stones versus sludge within the fundus of the gallbladder (image 23/3). No gallbladder distension or pericholecystic fluid. Pancreas: There is mild edema within the pancreas. The pancreatic duct is normal caliber. Small amount fluid in the retroperitoneum adjacent pancreatic body and extending into the retroperitoneum inferiorly. No organized collections. No variant pancreatic ductal anatomy. Spleen: Normal spleen. Adrenals/urinary tract: Adrenal glands and kidneys are normal. Stomach/Bowel: Stomach and limited of the small bowel is unremarkable Vascular/Lymphatic: Abdominal aortic normal caliber. No retroperitoneal periportal lymphadenopathy. Musculoskeletal: No aggressive osseous lesion IMPRESSION: 1. Mild acute pancreatitis. No organized collections. No variant ductal anatomy. 2. Moderate dilatation of the common bile duct and  common hepatic duct. No choledocholithiasis. 3. Stones versus sludge in the gallbladder. No evidence of acute cholecystitis. Electronically Signed    By: Jackquline Boxer M.D.   On: 10/04/2023 18:38   US  Abdomen Limited RUQ (LIVER/GB) Result Date: 10/04/2023 CLINICAL DATA:  28045 Choledocholithiasis 28045 EXAM: ULTRASOUND ABDOMEN LIMITED RIGHT UPPER QUADRANT COMPARISON:  CT scan abdomen and pelvis from earlier the same day. FINDINGS: Gallbladder: No gallstones or wall thickening visualized. Probable small amount of sludge noted. No sonographic Murphy sign noted by sonographer. Common bile duct: Diameter: Dilated measuring up to 9 mm in the proximal portion and up to 10 mm in the midportion. However, distal CBD is not visualized due to obscuration by overlying bowel gas. Liver: No focal lesion identified. Within normal limits in parenchymal echogenicity. Portal vein is patent on color Doppler imaging with normal direction of blood flow towards the liver. Other: None. IMPRESSION: 1. Dilated common bile duct measuring up to 10 mm. Distal CBD is not visualized due to obscuration by overlying bowel gas. If there is clinical concern for distal CBD obstruction, further evaluation with MRI abdomen/MRCP or ERCP is recommended. 2. Gallbladder sludge. No cholelithiasis or sonographic evidence of acute cholecystitis. Electronically Signed   By: Ree Molt M.D.   On: 10/04/2023 15:41   CT ABDOMEN PELVIS W CONTRAST Result Date: 10/04/2023 CLINICAL DATA:  Epigastric abdominal pain. EXAM: CT ABDOMEN AND PELVIS WITH CONTRAST TECHNIQUE: Multidetector CT imaging of the abdomen and pelvis was performed using the standard protocol following bolus administration of intravenous contrast. RADIATION DOSE REDUCTION: This exam was performed according to the departmental dose-optimization program which includes automated exposure control, adjustment of the mA and/or kV according to patient size and/or use of iterative reconstruction technique. CONTRAST:  75mL OMNIPAQUE  IOHEXOL  350 MG/ML SOLN COMPARISON:  CT dated 08/28/2020. FINDINGS: Lower chest: The visualized lung bases are  clear. No intra-abdominal free air or free fluid. Hepatobiliary: The liver is unremarkable. There is mild biliary dilatation. The gallbladder is distended. Layering small stones noted within the gallbladder. No pericholecystic fluid or dense of acute cholecystitis by CT. The common bile duct is mildly dilated measuring 13 mm in diameter. This is new since the prior CT. MRCP may provide better evaluation. Pancreas: There is inflammatory changes and edema surrounding the pancreas consistent with acute pancreatitis. No abscess or pseudocyst. Spleen: Normal in size without focal abnormality. Adrenals/Urinary Tract: The adrenal glands are unremarkable. There is no hydronephrosis on either side. There is symmetric enhancement and excretion of contrast by both kidneys. The visualized ureters and urinary bladder appear unremarkable. Stomach/Bowel: Postsurgical changes of the bowel with ileocolic anastomosis in the right upper abdomen. There is no bowel obstruction or active inflammation. Appendectomy. Vascular/Lymphatic: Advanced calcified and noncalcified plaque of the abdominal aorta. Atrophic distal native aorta. Bypass graft again noted. The iliac arteries remain patent. A femorofemoral bypass graft appears thrombosed. The IVC is unremarkable. No portal venous gas. There is no adenopathy. Reproductive: The uterus is grossly unremarkable no suspicious adnexal masses Other: None Musculoskeletal: No acute or significant osseous findings. IMPRESSION: 1. Acute pancreatitis. No abscess or pseudocyst. 2. Cholelithiasis. Mild biliary dilatation. MRCP may provide better evaluation. 3. Postsurgical changes of the bowel with ileocolic anastomosis in the right upper abdomen. No bowel obstruction. 4.  Aortic Atherosclerosis (ICD10-I70.0). Electronically Signed   By: Vanetta Chou M.D.   On: 10/04/2023 13:35   DG Chest 1 View Result Date: 10/04/2023 CLINICAL DATA:  885157 Epigastric pain 114842 EXAM: CHEST  1 VIEW COMPARISON:  Jul 28, 2021 FINDINGS: No focal airspace consolidation, pleural effusion, or pneumothorax. No cardiomegaly. No acute fracture or destructive lesion. Osteopenia. Multilevel thoracic osteophytosis. IMPRESSION: No acute cardiopulmonary abnormality. Electronically Signed   By: Rogelia Myers M.D.   On: 10/04/2023 12:52   (Echo, Carotid, EGD, Colonoscopy, ERCP)    Subjective: No complaints today  Discharge Exam: Vitals:   10/07/23 2327 10/08/23 0750  BP: (!) 103/53 (!) 114/57  Pulse:  74  Resp:  16  Temp:  97.8 F (36.6 C)  SpO2:  91%   Vitals:   10/07/23 1605 10/07/23 2200 10/07/23 2327 10/08/23 0750  BP: (!) 123/56 (!) 103/53 (!) 103/53 (!) 114/57  Pulse: 68 68  74  Resp: 17 18  16   Temp: 98.3 F (36.8 C) 97.6 F (36.4 C)  97.8 F (36.6 C)  TempSrc: Oral Axillary    SpO2: 92%   91%  Weight:      Height:        General: Pt is alert, awake, not in acute distress Cardiovascular: RRR, S1/S2 +, no rubs, no gallops Respiratory: CTA bilaterally, no wheezing, no rhonchi Abdominal: Soft, NT, ND, bowel sounds + Extremities: no edema, no cyanosis    The results of significant diagnostics from this hospitalization (including imaging, microbiology, ancillary and laboratory) are listed below for reference.     Microbiology: No results found for this or any previous visit (from the past 240 hours).   Labs: BNP (last 3 results) No results for input(s): BNP in the last 8760 hours. Basic Metabolic Panel: Recent Labs  Lab 10/04/23 1215 10/04/23 1231 10/05/23 0556 10/06/23 0459 10/07/23 0456  NA 137 138 137 136 133*  K 4.1 3.3* 4.3 3.6 3.5  CL 107 109 109 109 105  CO2 18*  --  22 18* 18*  GLUCOSE 178* 166* 97 62* 72  BUN 18 17 17 14 14   CREATININE 0.94 0.70 0.90 0.81 0.77  CALCIUM  9.1  --  8.5* 8.4* 8.2*   Liver Function Tests: Recent Labs  Lab 10/04/23 1215 10/05/23 1021 10/06/23 0459 10/07/23 0456 10/08/23 0422  AST 36 29 29 32 36  ALT 20 19 16 16 17    ALKPHOS 87 64 54 56 54  BILITOT 0.8 0.8 1.3* 1.9* 1.0  PROT 6.1* 5.2* 5.2* 5.3* 5.4*  ALBUMIN  3.5 2.8* 2.8* 2.8* 2.5*   Recent Labs  Lab 10/04/23 1215 10/05/23 1021 10/06/23 0459 10/07/23 0456  LIPASE 1,548* 289* 62* 40   No results for input(s): AMMONIA in the last 168 hours. CBC: Recent Labs  Lab 10/04/23 1215 10/04/23 1231 10/05/23 0556 10/07/23 0456  WBC 12.7*  --  11.6* 12.3*  NEUTROABS 8.5*  --   --   --   HGB 14.1 13.3 12.8 11.2*  HCT 43.8 39.0 37.9 32.9*  MCV 98.2  --  95.7 94.8  PLT 218  --  154 122*   Cardiac Enzymes: No results for input(s): CKTOTAL, CKMB, CKMBINDEX, TROPONINI in the last 168 hours. BNP: Invalid input(s): POCBNP CBG: No results for input(s): GLUCAP in the last 168 hours. D-Dimer No results for input(s): DDIMER in the last 72 hours. Hgb A1c No results for input(s): HGBA1C in the last 72 hours. Lipid Profile No results for input(s): CHOL, HDL, LDLCALC, TRIG, CHOLHDL, LDLDIRECT in the last 72 hours. Thyroid function studies No results for input(s): TSH, T4TOTAL, T3FREE, THYROIDAB in the last 72 hours.  Invalid input(s): FREET3 Anemia work up No results for input(s): VITAMINB12, FOLATE, FERRITIN, TIBC, IRON, RETICCTPCT  in the last 72 hours. Urinalysis    Component Value Date/Time   COLORURINE YELLOW 07/28/2008 1316   APPEARANCEUR CLEAR 07/28/2008 1316   LABSPEC 1.005 07/28/2008 1316   PHURINE 6.0 07/28/2008 1316   GLUCOSEU NEGATIVE 07/28/2008 1316   HGBUR NEGATIVE 07/28/2008 1316   BILIRUBINUR NEGATIVE 07/28/2008 1316   KETONESUR NEGATIVE 07/28/2008 1316   PROTEINUR NEGATIVE 07/28/2008 1316   UROBILINOGEN 0.2 07/28/2008 1316   NITRITE NEGATIVE 07/28/2008 1316   LEUKOCYTESUR  07/28/2008 1316    NEGATIVE MICROSCOPIC NOT DONE ON URINES WITH NEGATIVE PROTEIN, BLOOD, LEUKOCYTES, NITRITE, OR GLUCOSE <1000 mg/dL.   Sepsis Labs Recent Labs  Lab 10/04/23 1215 10/05/23 0556  10/07/23 0456  WBC 12.7* 11.6* 12.3*   Microbiology No results found for this or any previous visit (from the past 240 hours).   Time coordinating discharge: Over 35 minutes  SIGNED:   Erle Odell Castor, MD  [facility] 10/08/2023, 8:13 AM Pager   If 7PM-7AM, please contact night-coverage [email or web address] Password [email or web address]

## 2023-10-08 NOTE — Progress Notes (Signed)
 [facility] PROGRESS NOTE    Progress Note  Alejandra Ford  FMW:[id number] DOB: 08-25-1945 DOA: 10/04/2023 PCP: Chrystal Lamarr RAMAN, MD     Brief Narrative:   Alejandra Ford is an 78 y.o. female past medical history significant for arthritis hyperlipidemia peripheral vascular disease who came in with abdominal pain lipase was 1600 white blood cell count of 12, CT scan of the abdomen showed acute pancreatitis no abscess mild biliary dilation.  Abdominal ultrasound shows dilated common bile duct greater than 10 mm.  MRCP showed moderate dilated common bile duct stone and hepatic duct no stone appreciated, but it did showed gallbladder stones and sludge GI was consulted.  Assessment/Plan:   Acute Biliary pancreatitis: General Surgery was consulted recommended lap chole on 10/07/2023. Tolerating her diet, has required minimal narcotics. Continue bowel regimen  Essential hypertension Continue current regimen blood pressure well-controlled  Hyperlipidemia Continue statin therapy.  PAD: Continue aspirin  and statins.  Tobacco abuse: She has been counseled.  Ascending stage IIa colon cancer: Status post right colectomy.  Moderate protein caloric malnutrition: Noted.   DVT prophylaxis: lovenox  Family Communication:none Status is: Inpatient Remains inpatient appropriate because: Biliary pancreatitis    Code Status:     Code Status Orders  (From admission, onward)           Start     Ordered   10/04/23 1626  Full code  Continuous       Question:  By:  Answer:  Other   10/04/23 1625           Code Status History     Date Active Date Inactive Code Status Order ID Comments User Context   10/21/2020 1321 10/23/2020 1648 Full Code [id number]  Debby Hila, MD Inpatient   09/08/2020 1827 09/11/2020 1642 Full Code [id number]  Tobie Yetta HERO, MD ED   09/08/2020 1815 09/08/2020 1827 Full Code [id number]  Tobie Yetta HERO, MD ED      Advance Directive Documentation     Flowsheet Row Most Recent Value  Type of Advance Directive Living will, Healthcare Power of Attorney  Pre-existing out of facility DNR order (yellow form or pink MOST form) --  MOST Form in Place? --      IV Access:   Peripheral IV   Procedures and diagnostic studies:   No results found.    Medical Consultants:   None.   Subjective:    Alejandra Ford relates her pain is controlled this morning  Objective:    Vitals:   10/07/23 1042 10/07/23 1605 10/07/23 2200 10/07/23 2327  BP: 101/80 (!) 123/56 (!) 103/53 (!) 103/53  Pulse: 76 68 68   Resp: 17 17 18    Temp: 97.8 F (36.6 C) 98.3 F (36.8 C) 97.6 F (36.4 C)   TempSrc: Oral Oral Axillary   SpO2: 95% 92%    Weight:      Height:       SpO2: 92 % O2 Flow Rate (L/min): 2 L/min   Intake/Output Summary (Last 24 hours) at 10/08/2023 0715 Last data filed at 10/07/2023 1300 Gross per 24 hour  Intake 840 ml  Output 10 ml  Net 830 ml    Filed Weights   10/07/23 0733  Weight: 49.7 kg    Exam: General exam: In no acute distress. Respiratory system: Good air movement and clear to auscultation. Cardiovascular system: S1 & S2 heard, RRR. No JVD. Gastrointestinal system: Abdomen is nondistended, soft and nontender.  Extremities: No pedal edema. Skin: No  rashes, lesions or ulcers Psychiatry: Judgement and insight appear normal. Mood & affect appropriate.  Data Reviewed:    Labs: Basic Metabolic Panel: Recent Labs  Lab 10/04/23 1215 10/04/23 1231 10/05/23 0556 10/06/23 0459 10/07/23 0456  NA 137 138 137 136 133*  K 4.1 3.3* 4.3 3.6 3.5  CL 107 109 109 109 105  CO2 18*  --  22 18* 18*  GLUCOSE 178* 166* 97 62* 72  BUN 18 17 17 14 14   CREATININE 0.94 0.70 0.90 0.81 0.77  CALCIUM  9.1  --  8.5* 8.4* 8.2*   GFR Estimated Creatinine Clearance: 46.9 mL/min (by C-G formula based on SCr of 0.77 mg/dL). Liver Function Tests: Recent Labs  Lab 10/04/23 1215 10/05/23 1021 10/06/23 0459  10/07/23 0456 10/08/23 0422  AST 36 29 29 32 36  ALT 20 19 16 16 17   ALKPHOS 87 64 54 56 54  BILITOT 0.8 0.8 1.3* 1.9* 1.0  PROT 6.1* 5.2* 5.2* 5.3* 5.4*  ALBUMIN  3.5 2.8* 2.8* 2.8* 2.5*   Recent Labs  Lab 10/04/23 1215 10/05/23 1021 10/06/23 0459 10/07/23 0456  LIPASE 1,548* 289* 62* 40   No results for input(s): AMMONIA in the last 168 hours. Coagulation profile No results for input(s): INR, PROTIME in the last 168 hours. COVID-19 Labs  No results for input(s): DDIMER, FERRITIN, LDH, CRP in the last 72 hours.  Lab Results  Component Value Date   SARSCOV2NAA RESULT: NEGATIVE 10/19/2020   SARSCOV2NAA NEGATIVE 09/08/2020    CBC: Recent Labs  Lab 10/04/23 1215 10/04/23 1231 10/05/23 0556 10/07/23 0456  WBC 12.7*  --  11.6* 12.3*  NEUTROABS 8.5*  --   --   --   HGB 14.1 13.3 12.8 11.2*  HCT 43.8 39.0 37.9 32.9*  MCV 98.2  --  95.7 94.8  PLT 218  --  154 122*   Cardiac Enzymes: No results for input(s): CKTOTAL, CKMB, CKMBINDEX, TROPONINI in the last 168 hours. BNP (last 3 results) No results for input(s): PROBNP in the last 8760 hours. CBG: No results for input(s): GLUCAP in the last 168 hours. D-Dimer: No results for input(s): DDIMER in the last 72 hours. Hgb A1c: No results for input(s): HGBA1C in the last 72 hours. Lipid Profile: No results for input(s): CHOL, HDL, LDLCALC, TRIG, CHOLHDL, LDLDIRECT in the last 72 hours.  Thyroid function studies: No results for input(s): TSH, T4TOTAL, T3FREE, THYROIDAB in the last 72 hours.  Invalid input(s): FREET3 Anemia work up: No results for input(s): VITAMINB12, FOLATE, FERRITIN, TIBC, IRON, RETICCTPCT in the last 72 hours. Sepsis Labs: Recent Labs  Lab 10/04/23 1215 10/05/23 0556 10/07/23 0456  WBC 12.7* 11.6* 12.3*   Microbiology No results found for this or any previous visit (from the past 240 hours).   Medications:    aspirin  EC  81  mg Oral Daily   atorvastatin   80 mg Oral QPM   cholecalciferol   2,000 Units Oral QHS   enoxaparin  (LOVENOX ) injection  40 mg Subcutaneous Q24H   metoprolol  tartrate  12.5 mg Oral BID   pantoprazole  (PROTONIX ) IV  40 mg Intravenous Q24H   Continuous Infusions:  potassium chloride         LOS: 4 days   Erle Odell Castor  [facility]  10/08/2023, 7:15 AM

## 2023-10-08 NOTE — Discharge Instructions (Signed)

## 2023-10-08 NOTE — Plan of Care (Signed)
   Problem: Education: Goal: Knowledge of General Education information will improve Description Including pain rating scale, medication(s)/side effects and non-pharmacologic comfort measures Outcome: Progressing   Problem: Health Behavior/Discharge Planning: Goal: Ability to manage health-related needs will improve Outcome: Progressing

## 2023-10-08 NOTE — Progress Notes (Signed)
   Progress Note  1 Day Post-Op  Subjective: Tolerating diet and denies n/v. Pain well controlled. Husband at bedside.   Objective: Vital signs in last 24 hours: Temp:  [97.6 F (36.4 C)-98.3 F (36.8 C)] 97.8 F (36.6 C) (08/03 0750) Pulse Rate:  [68-76] 74 (08/03 0750) Resp:  [16-19] 16 (08/03 0750) BP: (101-123)/(53-80) 114/57 (08/03 0750) SpO2:  [91 %-95 %] 91 % (08/03 0750) Last BM Date : 10/06/23  Intake/Output from previous day: 08/02 0701 - 08/03 0700 In: 840 [P.O.:240; I.V.:500; IV Piggyback:100] Out: 10 [Blood:10] Intake/Output this shift: No intake/output data recorded.  PE: General: pleasant, WD, WN female, NAD HEENT: sclera anicteric Heart: regular, rate, and rhythm.   Lungs: Respiratory effort nonlabored Abd: soft, NT, ND, incisions C/D/I Psych: A&Ox3 with an appropriate affect.    Lab Results:  Recent Labs    10/07/23 0456  WBC 12.3*  HGB 11.2*  HCT 32.9*  PLT 122*   BMET Recent Labs    10/06/23 0459 10/07/23 0456  NA 136 133*  K 3.6 3.5  CL 109 105  CO2 18* 18*  GLUCOSE 62* 72  BUN 14 14  CREATININE 0.81 0.77  CALCIUM  8.4* 8.2*   PT/INR No results for input(s): LABPROT, INR in the last 72 hours. CMP     Component Value Date/Time   NA 133 (L) 10/07/2023 0456   K 3.5 10/07/2023 0456   CL 105 10/07/2023 0456   CO2 18 (L) 10/07/2023 0456   GLUCOSE 72 10/07/2023 0456   BUN 14 10/07/2023 0456   CREATININE 0.77 10/07/2023 0456   CALCIUM  8.2 (L) 10/07/2023 0456   PROT 5.4 (L) 10/08/2023 0422   ALBUMIN  2.5 (L) 10/08/2023 0422   AST 36 10/08/2023 0422   ALT 17 10/08/2023 0422   ALKPHOS 54 10/08/2023 0422   BILITOT 1.0 10/08/2023 0422   GFRNONAA >60 10/07/2023 0456   GFRAA  07/31/2008 0420    >60        The eGFR has been calculated using the MDRD equation. This calculation has not been validated in all clinical situations. eGFR's persistently <60 mL/min signify possible Chronic Kidney Disease.   Lipase     Component  Value Date/Time   LIPASE 40 10/07/2023 0456       Studies/Results: No results found.  Anti-infectives: Anti-infectives (From admission, onward)    Start     Dose/Rate Route Frequency Ordered Stop   10/07/23 0700  ceFAZolin  (ANCEF ) IVPB 2g/100 mL premix        2 g 200 mL/hr over 30 Minutes Intravenous On call to O.R. 10/07/23 0610 10/07/23 0842        Assessment/Plan POD1 s/p laparoscopic cholecystectomy Dr. Teresa - pt ambulating around room this AM, pain well controlled - stable for DC from surgery standpoint - reviewed post-op care with patient and husband and Rx sent for pain meds  FEN: HH/CM diet VTE: LMWH ID: [email or web address]  periop    LOS: 4 days     Burnard JONELLE Louder, [facility] Surgery 10/08/2023, 10:19 AM Please see Amion for pager number during day hours 7:00am-4:30pm

## 2023-10-09 ENCOUNTER — Encounter

## 2023-10-11 LAB — SURGICAL PATHOLOGY

## 2023-10-12 ENCOUNTER — Ambulatory Visit

## 2023-10-30 ENCOUNTER — Ambulatory Visit
Admission: RE | Admit: 2023-10-30 | Discharge: 2023-10-30 | Disposition: A | Discharge status: Home / Self Care | Source: Ambulatory Visit | Attending: Family Medicine | Admitting: Family Medicine

## 2023-10-30 DIAGNOSIS — Z1231 Encounter for screening mammogram for malignant neoplasm of breast: Secondary | ICD-10-CM

## 2023-12-01 ENCOUNTER — Inpatient Hospital Stay

## 2023-12-01 ENCOUNTER — Ambulatory Visit: Payer: Self-pay | Admitting: Oncology

## 2023-12-01 ENCOUNTER — Inpatient Hospital Stay: Attending: Oncology

## 2023-12-01 ENCOUNTER — Inpatient Hospital Stay: Admitting: Oncology

## 2023-12-01 VITALS — BP 92/58 | HR 82 | Temp 97.8°F | Resp 18 | Ht 63.0 in | Wt 105.4 lb

## 2023-12-01 DIAGNOSIS — Z23 Encounter for immunization: Secondary | ICD-10-CM | POA: Insufficient documentation

## 2023-12-01 DIAGNOSIS — Z85038 Personal history of other malignant neoplasm of large intestine: Secondary | ICD-10-CM | POA: Insufficient documentation

## 2023-12-01 DIAGNOSIS — C182 Malignant neoplasm of ascending colon: Secondary | ICD-10-CM | POA: Diagnosis not present

## 2023-12-01 LAB — CEA (ACCESS): CEA (CHCC): 3.23 ng/mL (ref 0.00–5.00)

## 2023-12-01 MED ORDER — INFLUENZA VAC SPLIT HIGH-DOSE 0.5 ML IM SUSY
0.5000 mL | PREFILLED_SYRINGE | Freq: Once | INTRAMUSCULAR | Status: AC
  Administered 2023-12-01: 0.5 mL via INTRAMUSCULAR
  Filled 2023-12-01: qty 0.5

## 2023-12-01 NOTE — Telephone Encounter (Signed)
-----   Message from Arley Hof sent at 12/01/2023  1:48 PM EDT ----- Please call patient the CEA is normal, follow-up as scheduled  ----- Message ----- From: Rebecka, Lab In [place] Sent: 12/01/2023  12:11 PM EDT To: Arley KATHEE Hof, MD

## 2023-12-01 NOTE — Telephone Encounter (Signed)
 Patient gave verbal understanding and had no further questions.

## 2023-12-01 NOTE — Progress Notes (Signed)
 [facility] OFFICE PROGRESS NOTE   Diagnosis: Colon cancer  INTERVAL HISTORY:   Alejandra Ford returns as scheduled.  She developed acute pancreatitis in July.  An MRCP showed pancreatitis and dilation of the common bile duct.  She underwent a cholecystectomy 10/07/2023.  She currently feels well.  No difficulty with bowel function.  No bleeding.  She relates weight loss to the recent surgery.  She continues smoking.  Objective:  Vital signs in last 24 hours:  Blood pressure (!) 92/58, pulse 82, temperature 97.8 F (36.6 C), temperature source Temporal, resp. rate 18, height 5' 3 (1.6 m), weight 105 lb 6.4 oz (47.8 kg), SpO2 99%.    Lymphatics: No cervical, supraclavicular, axillary, or inguinal nodes Resp: Distant breath sounds with end inspiratory rhonchi at the left posterior chest Cardio: Regular rate and rhythm GI: No hepatosplenomegaly, nontender, no mass Vascular: No leg edema   Lab Results:  Lab Results  Component Value Date   WBC 12.3 (H) 10/07/2023   HGB 11.2 (L) 10/07/2023   HCT 32.9 (L) 10/07/2023   MCV 94.8 10/07/2023   PLT 122 (L) 10/07/2023   NEUTROABS 8.5 (H) 10/04/2023    CMP  Lab Results  Component Value Date   NA 133 (L) 10/07/2023   K 3.5 10/07/2023   CL 105 10/07/2023   CO2 18 (L) 10/07/2023   GLUCOSE 72 10/07/2023   BUN 14 10/07/2023   CREATININE 0.77 10/07/2023   CALCIUM  8.2 (L) 10/07/2023   PROT 5.4 (L) 10/08/2023   ALBUMIN  2.5 (L) 10/08/2023   AST 36 10/08/2023   ALT 17 10/08/2023   ALKPHOS 54 10/08/2023   BILITOT 1.0 10/08/2023   GFRNONAA >60 10/07/2023   GFRAA  07/31/2008    >60        The eGFR has been calculated using the MDRD equation. This calculation has not been validated in all clinical situations. eGFR's persistently <60 mL/min signify possible Chronic Kidney Disease.    Lab Results  Component Value Date   CEA1 2.8 09/10/2020   CEA 2.91 06/01/2023    Lab Results  Component Value Date   INR 1.2  07/29/2008   LABPROT 15.9 (H) 07/29/2008    Imaging:  No results found.  Medications: I have reviewed the patient's current medications.   Assessment/Plan: Ascending colon cancer, stage IIa ([id number]), status post a right colectomy 10/21/2020 No lymphovascular or perineural invasion, grade 3-poorly differentiated, 0/24 lymph nodes, MSI-high, loss of MLH1 and PMS2 expression MLH1 hypermethylation present CT 08/29/2020-tiny peripheral lung nodules, ascending colon mass, aorta to iliac artery bypass graft Normal preoperative CEA Guardant Reveal 11/30/2020-ctDNA not detected Guardant Reveal 02/12/2021- ctDNA not detected Colonoscopy 02/21/2022-polyps removed from the rectum  Peripheral vascular disease CAD, NSTEMI 09/08/2020 Microcytic anemia secondary #1 Ongoing tobacco use Hyperlipidemia CT chest 08/28/2020--3 mm and less peripheral lung nodules CT chest 08/16/2021-new consolidation in the posterior left lower lobe and mild groundglass opacity in the anterior right middle lobe suspicious for pneumonia CT chest 09/30/2021-focal left lower lobe airspace disease decreased, but not completely resolved, resolution of right upper lobe groundglass opacities, stable pulmonary micronodules CT chest 11/11/2021-consolidation in the posterior left lower lobe appears scarlike and is smaller, scattered 1-2 mm subpleural micronodules in the upper lobes-unchanged CT chest 02/08/2023-stable left lower lobe scar, no evidence of metastatic disease  8.  Admission 10/04/2023 with pancreatitis, imaging revealed a dilated common bile duct, status post cholecystectomy 10/07/2023 10/04/2023 CT abdomen/pelvis: Mild biliary dilation, distended gallbladder, stones in the gallbladder dilated common duct,  pancreatitis 10/04/2023 abdominal ultrasound: Dilated common bile duct gallbladder sludge 10/04/2023 MRI/MRCP: Mild acute pancreatitis, moderate dilation of common bile duct and common hepatic duct, no choledocholithiasis, stone  versus sludge in the gallbladder 10/07/2023 cholecystectomy: Mild chronic cholecystitis with cholelithiasis       Disposition: Alejandra Ford is in clinical remission from colon cancer.  We will follow-up on the CEA from today.  She is recovering from a recent episode of pancreatitis and cholecystectomy.  She will return for an office and lab visit in 1 year.  Alejandra Ford will continue follow-up with pulmonary medicine for chest CT surveillance.  She is scheduled for follow-up in December of this year. She received an influenza vaccine today.  Arley Hof, MD  12/01/2023  11:07 AM

## 2023-12-08 ENCOUNTER — Telehealth: Payer: Self-pay

## 2023-12-08 ENCOUNTER — Telehealth: Payer: Self-pay | Admitting: *Deleted

## 2023-12-08 NOTE — Telephone Encounter (Signed)
 Was able to speak w/patient after daughter, [name] had called re: flu vaccine she had on 12/01/2023. She thinks the flu vaccine was given too high up her arm and medication was injected into her shoulder bursa and developed bursitis with pain. Taking ibuprofen and it is slowly getting better. Wanted MD aware. MD has been notified.

## 2023-12-08 NOTE — Telephone Encounter (Signed)
 Attempted to reach patient regarding flu vaccine concerns, unable to reach and unable to leave a voice message.

## 2024-01-02 ENCOUNTER — Other Ambulatory Visit: Payer: Self-pay | Admitting: Cardiology

## 2024-02-19 ENCOUNTER — Ambulatory Visit (HOSPITAL_BASED_OUTPATIENT_CLINIC_OR_DEPARTMENT_OTHER): Admitting: Cardiology

## 2024-02-19 ENCOUNTER — Encounter (HOSPITAL_BASED_OUTPATIENT_CLINIC_OR_DEPARTMENT_OTHER): Payer: Self-pay | Admitting: Cardiology

## 2024-02-19 VITALS — BP 100/62 | HR 69 | Ht 63.0 in | Wt 108.9 lb

## 2024-02-19 DIAGNOSIS — E7800 Pure hypercholesterolemia, unspecified: Secondary | ICD-10-CM

## 2024-02-19 DIAGNOSIS — Z720 Tobacco use: Secondary | ICD-10-CM | POA: Diagnosis not present

## 2024-02-19 DIAGNOSIS — I2510 Atherosclerotic heart disease of native coronary artery without angina pectoris: Secondary | ICD-10-CM

## 2024-02-19 DIAGNOSIS — I252 Old myocardial infarction: Secondary | ICD-10-CM

## 2024-02-19 DIAGNOSIS — I739 Peripheral vascular disease, unspecified: Secondary | ICD-10-CM | POA: Diagnosis not present

## 2024-02-19 DIAGNOSIS — R002 Palpitations: Secondary | ICD-10-CM

## 2024-02-19 NOTE — Progress Notes (Signed)
 Cardiology Office Note:  .    Date:  02/19/2024  ID:  Alejandra Ford, DOB 31-Oct-1945, MRN [id number] PCP: Chrystal Lamarr RAMAN, MD  [facility] Providers Cardiologist:  Shelda Bruckner, MD     History of Present Illness: .    Alejandra Ford is a 78 y.o. female with a hx of palpitations, hyperlipidemia, PAD, and tobacco abuse, CAD with history of NSTEMI, colon cancer s/p R colectomy who is seen for follow up. Her initial appointment 10/05/2020 had been scheduled as a new consult at the request of Chrystal Lamarr RAMAN, * for the evaluation and management of palpitations, but in the interim she was admitted to the hospital and treated for NSTEMI.  CV history: Presented with NSTEMI 09/2020. Cath with 100% stenosis at distal Lcx with left to left collaterals. Proximal RCA 50% stenosed. Given collaterals, no stent attempted. Echo 09/2020 with EF 50-55% with hypokinesis of mid to distal inferior, inferolateral, and anterolateral wall and akinesis of apical inferior wall. No LV thrombus seen with contrast.   Today: Overall has been doing pretty well. Got her flu shot but hit the bone in her upper arm, caused bursitis and is still recovering. Had some heart palpitations after treating bursitis with ibuprofen. She took nitroglycerin  for the palpitations and stopped the ibuprofen. Difficult to describe the sensation, wasn't pain, wasn't rapid/irregular, more of a sensation that she could feel her heart.   Since our last visit, had gallstone pancreatitis, had her gallbladder removed 10/07/23.   No leg pain/claudication.   ROS:  Denies chest pain, shortness of breath at rest or with normal exertion. No PND, orthopnea, LE edema or unexpected weight gain. No syncope. ROS otherwise negative except as noted.   Studies Reviewed: SABRA          Physical Exam:    VS:  BP 100/62 (BP Location: Right Arm, Patient Position: Sitting, Cuff Size: Normal)   Pulse 69   Ht 5' 3 (1.6 m)   Wt 108 lb 14.4 oz  (49.4 kg)   SpO2 95%   BMI 19.29 kg/m    Wt Readings from Last 3 Encounters:  02/19/24 108 lb 14.4 oz (49.4 kg)  12/01/23 105 lb 6.4 oz (47.8 kg)  10/07/23 109 lb 9.1 oz (49.7 kg)    GEN: Well nourished, well developed in no acute distress HEENT: Normal, moist mucous membranes NECK: No JVD CARDIAC: regular rhythm, normal S1 and S2, no rubs or gallops. No murmur. VASCULAR: Radial and DP pulses 2+ bilaterally. No carotid bruits RESPIRATORY:  Rhonchi bilaterally, no rales, wheezing  ABDOMEN: Soft, non-tender, non-distended MUSCULOSKELETAL:  Ambulates independently SKIN: Warm and dry, no edema NEUROLOGIC:  Alert and oriented x 3. No focal neuro deficits noted. PSYCHIATRIC:  Normal affect   ASSESSMENT AND PLAN: .    Palpitations -reviewed today. Had brief exacerbation after bursitis/ibuprofen -for now, she will monitor symptoms and contact me if things worsen -reviewed red flag warning signs that need immediate medical attention   Coronary disease, without angina History of NSTEMI 09/2020 History of PAD (mild) Pure hypercholesterolemia, LDL goal <70 -chest pain free, reviewed her anginal equivalent -echo, cath reviewed -tolerating aspirin , atorvastatin , metoprolol  -last LDL 39 08/2023 per [facility]   Tobacco abuse: see prior counseling, she is thinking about it   Cardiac risk counseling and prevention recommendations: -recommend heart healthy/Mediterranean diet, with whole grains, fruits, vegetable, fish, lean meats, nuts, and olive oil. Limit salt. -recommend moderate walking, 3-5 times/week for 30-50 minutes each session. Aim for  at least 150 minutes.week. Goal should be pace of 3 miles/hours, or walking 1.5 miles in 30 minutes -recommend avoidance of tobacco products. Avoid excess alcohol.  Dispo: Follow-up in 12 months, or sooner as needed.  Signed, Shelda Bruckner, MD

## 2024-02-19 NOTE — Patient Instructions (Signed)
 Medication Instructions:  No changes *If you need a refill on your cardiac medications before your next appointment, please call your pharmacy*  Lab Work: none If you have labs (blood work) drawn today and your tests are completely normal, you will receive your results only by: MyChart Message (if you have MyChart) OR A paper copy in the mail If you have any lab test that is abnormal or we need to change your treatment, we will call you to review the results.  Testing/Procedures: none  Follow-Up: At [facility] you and your health needs are our priority.  As part of our continuing mission to provide you with exceptional heart care, our providers are all part of one team.  This team includes your primary Cardiologist (physician) and Advanced Practice Providers or APPs (Physician Assistants and Nurse Practitioners) who all work together to provide you with the care you need, when you need it.  Your next appointment:   12 month(s)  Provider:   Shelda Bruckner, MD

## 2024-03-04 ENCOUNTER — Ambulatory Visit
Admission: RE | Admit: 2024-03-04 | Discharge: 2024-03-04 | Disposition: A | Discharge status: Home / Self Care | Source: Ambulatory Visit | Attending: Acute Care | Admitting: Acute Care

## 2024-03-04 DIAGNOSIS — J984 Other disorders of lung: Secondary | ICD-10-CM

## 2024-03-15 ENCOUNTER — Ambulatory Visit: Admitting: Acute Care

## 2024-03-29 ENCOUNTER — Ambulatory Visit: Admitting: Acute Care

## 2024-03-29 ENCOUNTER — Other Ambulatory Visit (HOSPITAL_BASED_OUTPATIENT_CLINIC_OR_DEPARTMENT_OTHER): Payer: Self-pay | Admitting: Cardiology

## 2024-03-29 DIAGNOSIS — I2510 Atherosclerotic heart disease of native coronary artery without angina pectoris: Secondary | ICD-10-CM

## 2024-11-28 ENCOUNTER — Ambulatory Visit: Admitting: Oncology

## 2024-11-28 ENCOUNTER — Other Ambulatory Visit
